# Patient Record
Sex: Male | Born: 1945 | Race: White | Hispanic: No | Marital: Married | Smoking: Current every day smoker
Health system: Southern US, Community
[De-identification: ages and names within clinical notes are randomized; demographics above are authoritative.]

## PROBLEM LIST (undated history)

## (undated) DIAGNOSIS — F419 Anxiety disorder, unspecified: Secondary | ICD-10-CM

## (undated) DIAGNOSIS — J449 Chronic obstructive pulmonary disease, unspecified: Secondary | ICD-10-CM

## (undated) DIAGNOSIS — C61 Malignant neoplasm of prostate: Secondary | ICD-10-CM

## (undated) DIAGNOSIS — K851 Biliary acute pancreatitis without necrosis or infection: Secondary | ICD-10-CM

## (undated) DIAGNOSIS — J189 Pneumonia, unspecified organism: Secondary | ICD-10-CM

## (undated) DIAGNOSIS — E119 Type 2 diabetes mellitus without complications: Secondary | ICD-10-CM

## (undated) DIAGNOSIS — K8020 Calculus of gallbladder without cholecystitis without obstruction: Secondary | ICD-10-CM

## (undated) DIAGNOSIS — F17210 Nicotine dependence, cigarettes, uncomplicated: Secondary | ICD-10-CM

## (undated) HISTORY — PX: PROSTATE SURGERY: SHX751

## (undated) HISTORY — PX: ROTATOR CUFF REPAIR: SHX139

## (undated) HISTORY — PX: LUMBAR FUSION: SHX111

---

## 1998-08-21 ENCOUNTER — Emergency Department (HOSPITAL_COMMUNITY): Admission: EM | Admit: 1998-08-21 | Discharge: 1998-08-21 | Payer: Self-pay

## 1998-11-05 ENCOUNTER — Encounter: Payer: Self-pay | Admitting: Emergency Medicine

## 1998-11-05 ENCOUNTER — Emergency Department (HOSPITAL_COMMUNITY): Admission: EM | Admit: 1998-11-05 | Discharge: 1998-11-05 | Payer: Self-pay | Admitting: Emergency Medicine

## 1998-12-27 ENCOUNTER — Emergency Department (HOSPITAL_COMMUNITY): Admission: EM | Admit: 1998-12-27 | Discharge: 1998-12-28 | Payer: Self-pay | Admitting: Emergency Medicine

## 1999-02-25 ENCOUNTER — Encounter: Payer: Self-pay | Admitting: Emergency Medicine

## 1999-02-25 ENCOUNTER — Emergency Department (HOSPITAL_COMMUNITY): Admission: EM | Admit: 1999-02-25 | Discharge: 1999-02-25 | Payer: Self-pay | Admitting: Emergency Medicine

## 1999-04-11 ENCOUNTER — Encounter: Payer: Self-pay | Admitting: Emergency Medicine

## 1999-04-11 ENCOUNTER — Emergency Department (HOSPITAL_COMMUNITY): Admission: EM | Admit: 1999-04-11 | Discharge: 1999-04-11 | Payer: Self-pay | Admitting: Emergency Medicine

## 1999-06-21 ENCOUNTER — Emergency Department (HOSPITAL_COMMUNITY): Admission: EM | Admit: 1999-06-21 | Discharge: 1999-06-22 | Payer: Self-pay | Admitting: *Deleted

## 1999-07-16 ENCOUNTER — Encounter: Payer: Self-pay | Admitting: Urology

## 1999-07-16 ENCOUNTER — Encounter: Admission: RE | Admit: 1999-07-16 | Discharge: 1999-07-16 | Payer: Self-pay | Admitting: Urology

## 1999-08-22 ENCOUNTER — Emergency Department (HOSPITAL_COMMUNITY): Admission: EM | Admit: 1999-08-22 | Discharge: 1999-08-22 | Payer: Self-pay

## 1999-11-03 ENCOUNTER — Encounter: Payer: Self-pay | Admitting: Emergency Medicine

## 1999-11-03 ENCOUNTER — Emergency Department (HOSPITAL_COMMUNITY): Admission: EM | Admit: 1999-11-03 | Discharge: 1999-11-03 | Payer: Self-pay | Admitting: Emergency Medicine

## 1999-12-19 ENCOUNTER — Emergency Department (HOSPITAL_COMMUNITY): Admission: EM | Admit: 1999-12-19 | Discharge: 1999-12-19 | Payer: Self-pay | Admitting: Emergency Medicine

## 1999-12-19 ENCOUNTER — Encounter: Payer: Self-pay | Admitting: Emergency Medicine

## 2000-04-02 ENCOUNTER — Emergency Department (HOSPITAL_COMMUNITY): Admission: EM | Admit: 2000-04-02 | Discharge: 2000-04-02 | Payer: Self-pay

## 2000-07-07 ENCOUNTER — Emergency Department (HOSPITAL_COMMUNITY): Admission: EM | Admit: 2000-07-07 | Discharge: 2000-07-07 | Payer: Self-pay | Admitting: Emergency Medicine

## 2000-07-07 ENCOUNTER — Encounter: Payer: Self-pay | Admitting: Emergency Medicine

## 2000-10-10 ENCOUNTER — Encounter: Payer: Self-pay | Admitting: Emergency Medicine

## 2000-10-10 ENCOUNTER — Emergency Department (HOSPITAL_COMMUNITY): Admission: EM | Admit: 2000-10-10 | Discharge: 2000-10-10 | Payer: Self-pay | Admitting: Emergency Medicine

## 2001-03-14 ENCOUNTER — Encounter: Payer: Self-pay | Admitting: *Deleted

## 2001-03-14 ENCOUNTER — Encounter (INDEPENDENT_AMBULATORY_CARE_PROVIDER_SITE_OTHER): Payer: Self-pay | Admitting: Specialist

## 2001-03-14 ENCOUNTER — Inpatient Hospital Stay (HOSPITAL_COMMUNITY): Admission: EM | Admit: 2001-03-14 | Discharge: 2001-03-18 | Payer: Self-pay

## 2001-03-14 ENCOUNTER — Encounter: Payer: Self-pay | Admitting: Family Medicine

## 2001-07-10 ENCOUNTER — Ambulatory Visit (HOSPITAL_COMMUNITY): Admission: RE | Admit: 2001-07-10 | Discharge: 2001-07-10 | Payer: Self-pay | Admitting: Internal Medicine

## 2002-09-01 ENCOUNTER — Encounter: Payer: Self-pay | Admitting: Emergency Medicine

## 2002-09-01 ENCOUNTER — Emergency Department (HOSPITAL_COMMUNITY): Admission: EM | Admit: 2002-09-01 | Discharge: 2002-09-01 | Payer: Self-pay | Admitting: Emergency Medicine

## 2002-09-16 ENCOUNTER — Emergency Department (HOSPITAL_COMMUNITY): Admission: EM | Admit: 2002-09-16 | Discharge: 2002-09-16 | Payer: Self-pay | Admitting: Emergency Medicine

## 2002-12-12 ENCOUNTER — Emergency Department (HOSPITAL_COMMUNITY): Admission: EM | Admit: 2002-12-12 | Discharge: 2002-12-12 | Payer: Self-pay | Admitting: Emergency Medicine

## 2003-01-11 ENCOUNTER — Emergency Department (HOSPITAL_COMMUNITY): Admission: EM | Admit: 2003-01-11 | Discharge: 2003-01-11 | Payer: Self-pay | Admitting: Emergency Medicine

## 2003-03-01 ENCOUNTER — Encounter: Admission: RE | Admit: 2003-03-01 | Discharge: 2003-03-01 | Payer: Self-pay | Admitting: Sports Medicine

## 2003-03-04 ENCOUNTER — Encounter: Admission: RE | Admit: 2003-03-04 | Discharge: 2003-03-04 | Payer: Self-pay | Admitting: Family Medicine

## 2003-03-05 ENCOUNTER — Emergency Department (HOSPITAL_COMMUNITY): Admission: EM | Admit: 2003-03-05 | Discharge: 2003-03-05 | Payer: Self-pay

## 2003-03-19 ENCOUNTER — Encounter: Admission: RE | Admit: 2003-03-19 | Discharge: 2003-03-19 | Payer: Self-pay | Admitting: Sports Medicine

## 2003-03-19 ENCOUNTER — Encounter: Admission: RE | Admit: 2003-03-19 | Discharge: 2003-03-19 | Payer: Self-pay | Admitting: Family Medicine

## 2003-03-21 ENCOUNTER — Emergency Department (HOSPITAL_COMMUNITY): Admission: EM | Admit: 2003-03-21 | Discharge: 2003-03-21 | Payer: Self-pay | Admitting: Emergency Medicine

## 2003-03-23 ENCOUNTER — Emergency Department (HOSPITAL_COMMUNITY): Admission: AD | Admit: 2003-03-23 | Discharge: 2003-03-23 | Payer: Self-pay | Admitting: Family Medicine

## 2003-03-25 ENCOUNTER — Encounter: Admission: RE | Admit: 2003-03-25 | Discharge: 2003-03-25 | Payer: Self-pay | Admitting: Sports Medicine

## 2003-04-17 ENCOUNTER — Emergency Department (HOSPITAL_COMMUNITY): Admission: EM | Admit: 2003-04-17 | Discharge: 2003-04-17 | Payer: Self-pay | Admitting: Emergency Medicine

## 2003-04-27 ENCOUNTER — Emergency Department (HOSPITAL_COMMUNITY): Admission: EM | Admit: 2003-04-27 | Discharge: 2003-04-27 | Payer: Self-pay | Admitting: Emergency Medicine

## 2003-06-01 ENCOUNTER — Emergency Department (HOSPITAL_COMMUNITY): Admission: EM | Admit: 2003-06-01 | Discharge: 2003-06-01 | Payer: Self-pay | Admitting: Emergency Medicine

## 2003-06-15 ENCOUNTER — Emergency Department (HOSPITAL_COMMUNITY): Admission: EM | Admit: 2003-06-15 | Discharge: 2003-06-15 | Payer: Self-pay | Admitting: Emergency Medicine

## 2003-07-20 ENCOUNTER — Emergency Department (HOSPITAL_COMMUNITY): Admission: EM | Admit: 2003-07-20 | Discharge: 2003-07-20 | Payer: Self-pay | Admitting: Emergency Medicine

## 2003-10-19 ENCOUNTER — Emergency Department (HOSPITAL_COMMUNITY): Admission: EM | Admit: 2003-10-19 | Discharge: 2003-10-20 | Payer: Self-pay

## 2003-12-15 ENCOUNTER — Emergency Department (HOSPITAL_COMMUNITY): Admission: EM | Admit: 2003-12-15 | Discharge: 2003-12-15 | Payer: Self-pay | Admitting: *Deleted

## 2003-12-27 ENCOUNTER — Emergency Department (HOSPITAL_COMMUNITY): Admission: EM | Admit: 2003-12-27 | Discharge: 2003-12-27 | Payer: Self-pay | Admitting: Emergency Medicine

## 2004-05-02 ENCOUNTER — Emergency Department: Payer: Self-pay | Admitting: Emergency Medicine

## 2004-05-14 ENCOUNTER — Emergency Department: Payer: Self-pay | Admitting: Internal Medicine

## 2004-10-04 ENCOUNTER — Emergency Department (HOSPITAL_COMMUNITY): Admission: EM | Admit: 2004-10-04 | Discharge: 2004-10-04 | Payer: Self-pay | Admitting: Emergency Medicine

## 2004-12-09 IMAGING — CR DG LUMBAR SPINE COMPLETE 4+V
5 series · 5 of 5 positions shown · non-contrast
Comparison: none

CLINICAL DATA: 57-year-old with lower back pain.  Burning in the right hip.  Lifted something heavy.
 LUMBAR SPINE:
 Comparison 07/20/03.
 AP, lateral and oblique views are performed of the lumbar spine, showing the patient to have had prior posterior fusion at L4-5.  There is no evidence for acute fracture, dislocation, spondylolysis or spondylolisthesis.  Mild degenerative changes are identified at L3-4 and L2-3.  
 IMPRESSION
 Postoperative change without evidence for acute abnormality.

[view not recorded (1 of 5)]
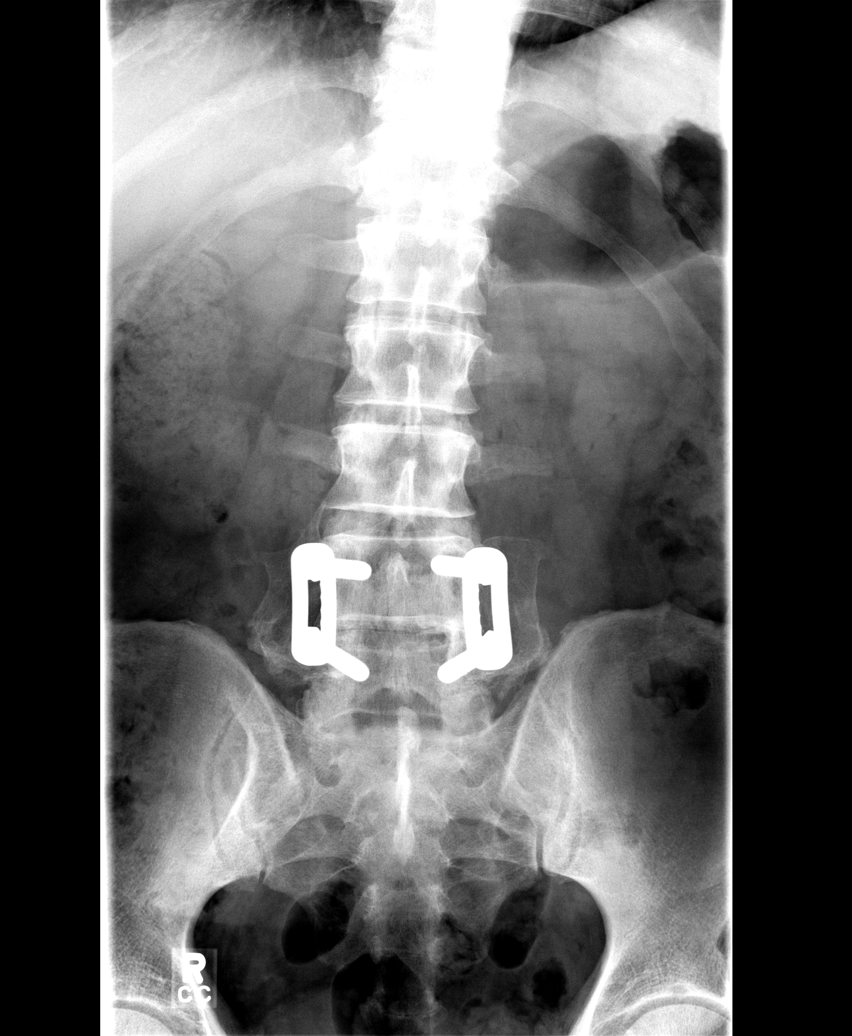

[view not recorded (2 of 5)]
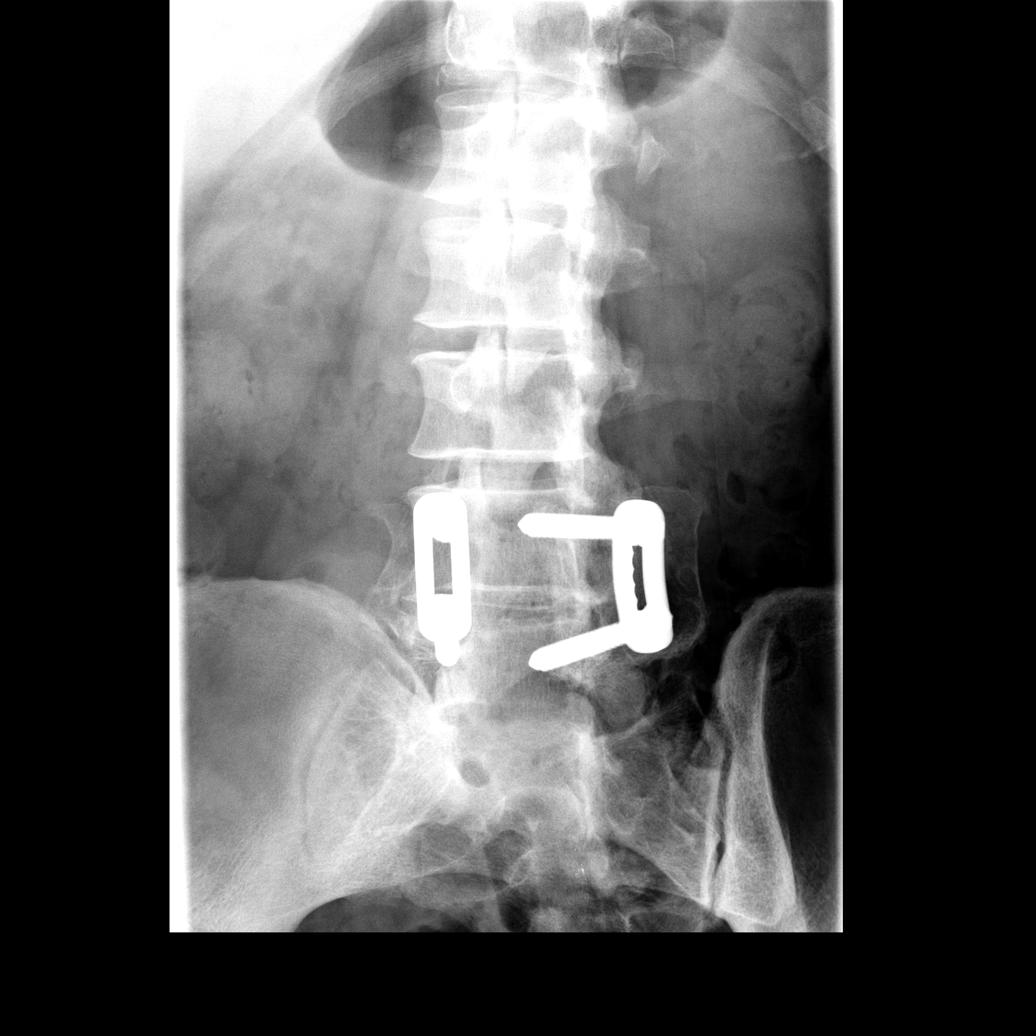

[view not recorded (3 of 5)]
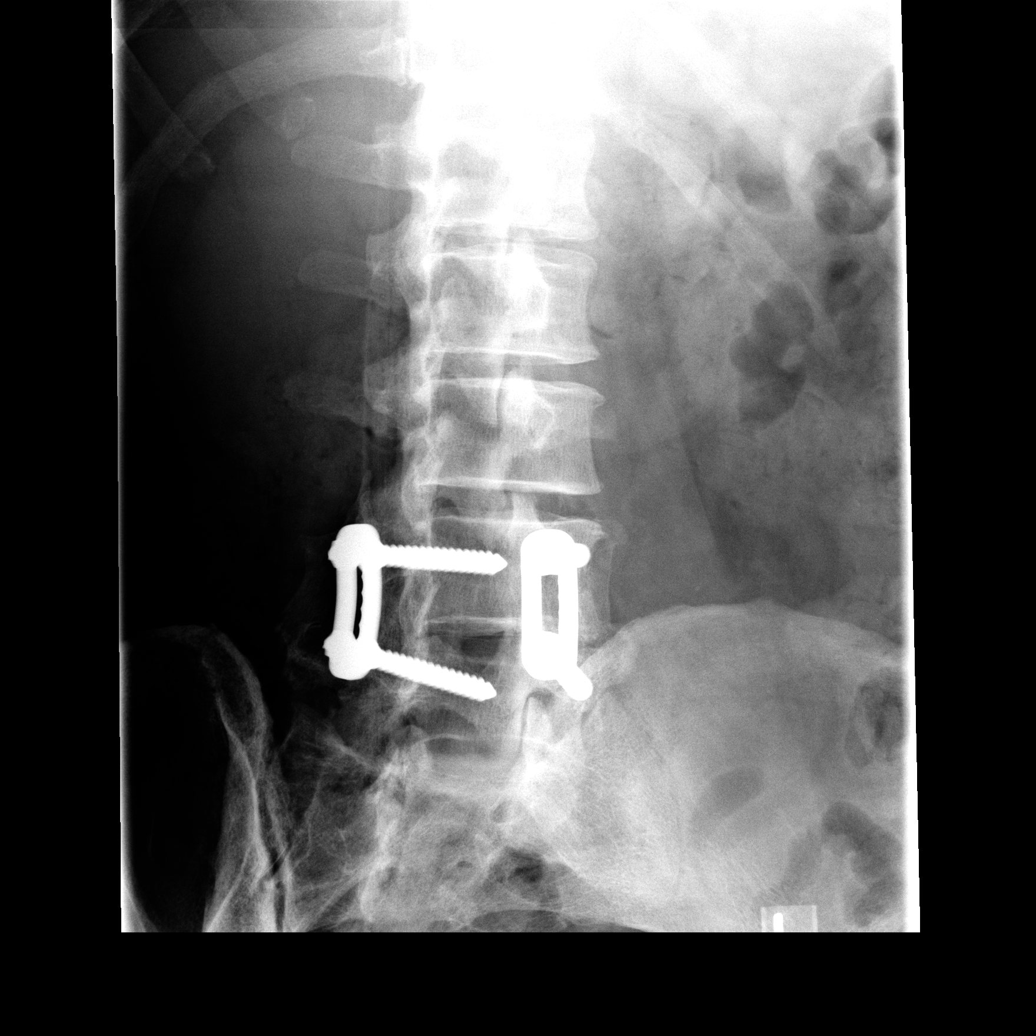

[view not recorded (4 of 5)]
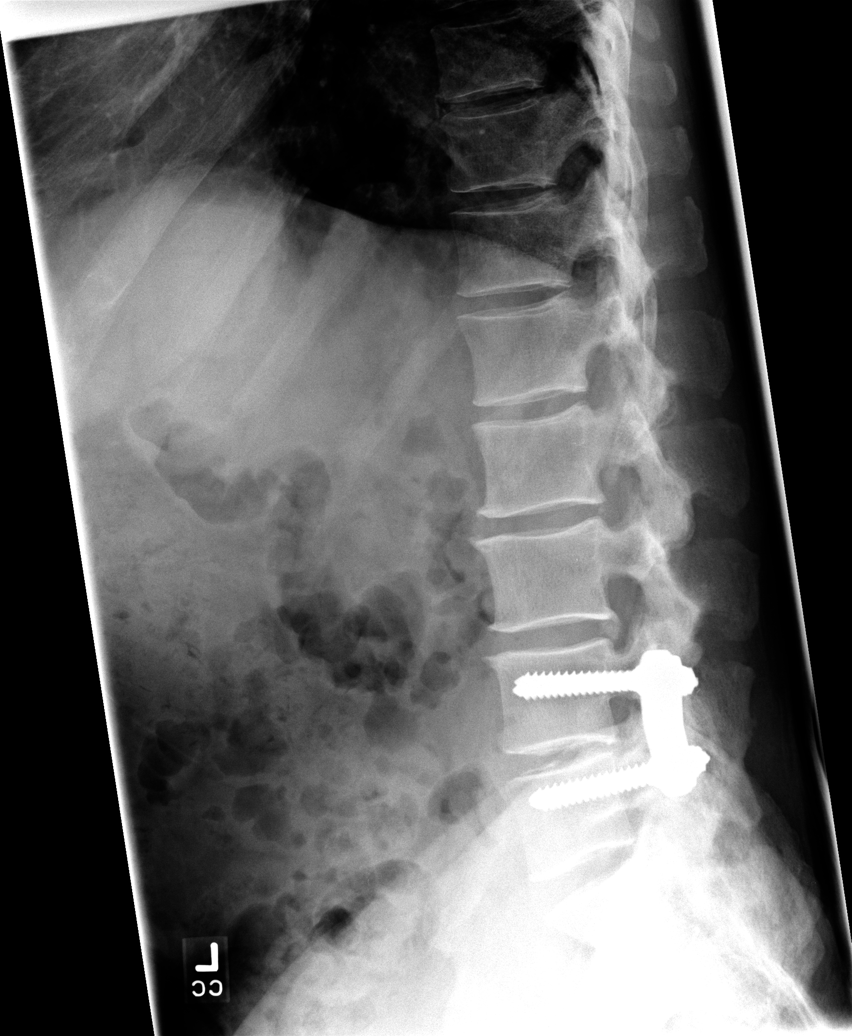

[view not recorded (5 of 5)]
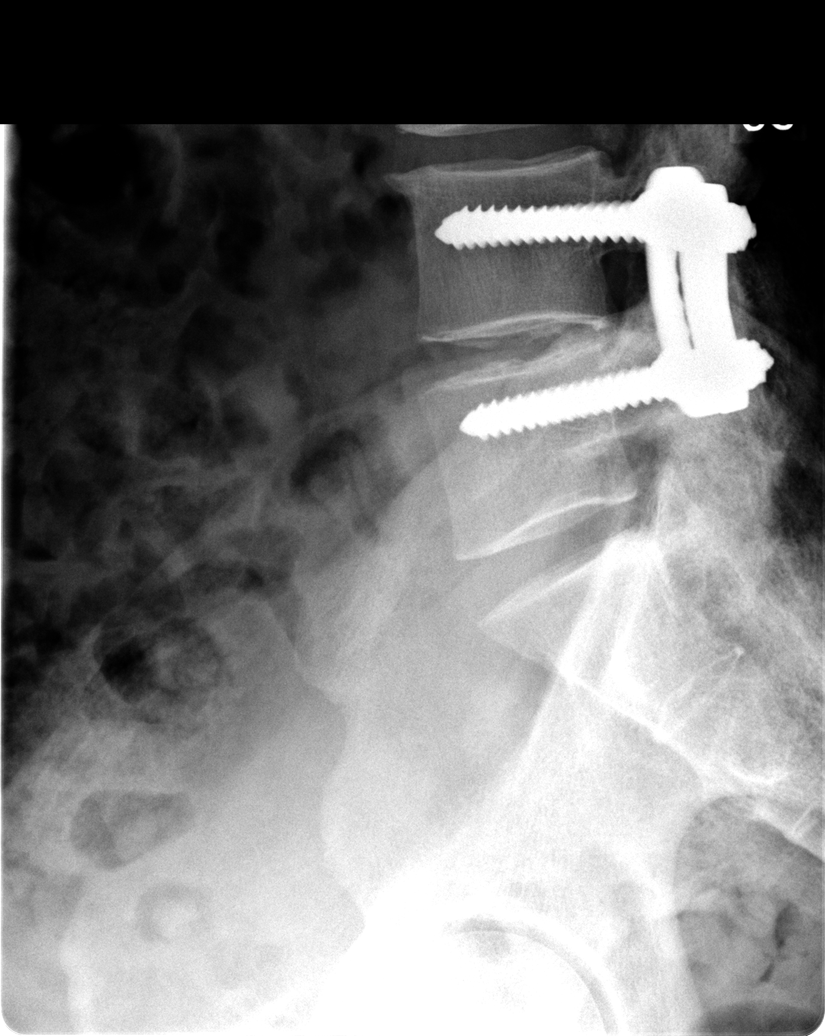

[5 of 5 positions shown; findings below may reference images not displayed]

## 2005-03-04 ENCOUNTER — Emergency Department (HOSPITAL_COMMUNITY): Admission: EM | Admit: 2005-03-04 | Discharge: 2005-03-04 | Payer: Self-pay | Admitting: Emergency Medicine

## 2005-05-02 ENCOUNTER — Emergency Department (HOSPITAL_COMMUNITY): Admission: EM | Admit: 2005-05-02 | Discharge: 2005-05-02 | Payer: Self-pay | Admitting: Emergency Medicine

## 2006-06-09 DIAGNOSIS — N200 Calculus of kidney: Secondary | ICD-10-CM | POA: Insufficient documentation

## 2006-06-09 DIAGNOSIS — F17200 Nicotine dependence, unspecified, uncomplicated: Secondary | ICD-10-CM

## 2006-06-09 DIAGNOSIS — M545 Low back pain: Secondary | ICD-10-CM

## 2009-05-11 ENCOUNTER — Ambulatory Visit: Payer: Self-pay | Admitting: Cardiovascular Disease

## 2009-05-11 ENCOUNTER — Inpatient Hospital Stay (HOSPITAL_COMMUNITY): Admission: EM | Admit: 2009-05-11 | Discharge: 2009-05-19 | Payer: Self-pay | Admitting: Emergency Medicine

## 2009-05-11 ENCOUNTER — Ambulatory Visit: Payer: Self-pay | Admitting: Critical Care Medicine

## 2010-05-02 ENCOUNTER — Encounter: Payer: Self-pay | Admitting: Family Medicine

## 2010-06-28 LAB — URINALYSIS, ROUTINE W REFLEX MICROSCOPIC
Bilirubin Urine: NEGATIVE
Glucose, UA: NEGATIVE mg/dL
Hgb urine dipstick: NEGATIVE
Ketones, ur: NEGATIVE mg/dL
Nitrite: NEGATIVE
Protein, ur: NEGATIVE mg/dL
Specific Gravity, Urine: 1.02 (ref 1.005–1.030)
Urobilinogen, UA: 0.2 mg/dL (ref 0.0–1.0)
pH: 5 (ref 5.0–8.0)

## 2010-06-28 LAB — POCT I-STAT 3, ART BLOOD GAS (G3+)
Acid-base deficit: 6 mmol/L — ABNORMAL HIGH (ref 0.0–2.0)
Acid-base deficit: 6 mmol/L — ABNORMAL HIGH (ref 0.0–2.0)
Acid-base deficit: 9 mmol/L — ABNORMAL HIGH (ref 0.0–2.0)
Bicarbonate: 17.5 mEq/L — ABNORMAL LOW (ref 20.0–24.0)
Bicarbonate: 18.6 mEq/L — ABNORMAL LOW (ref 20.0–24.0)
Bicarbonate: 20.2 mEq/L (ref 20.0–24.0)
Bicarbonate: 22.9 mEq/L (ref 20.0–24.0)
O2 Saturation: 91 %
O2 Saturation: 92 %
O2 Saturation: 97 %
Patient temperature: 36.1
Patient temperature: 36.3
Patient temperature: 36.7
Patient temperature: 98.6
TCO2: 20 mmol/L (ref 0–100)
TCO2: 21 mmol/L (ref 0–100)
TCO2: 25 mmol/L (ref 0–100)
pCO2 arterial: 39.8 mmHg (ref 35.0–45.0)
pCO2 arterial: 45.1 mmHg — ABNORMAL HIGH (ref 35.0–45.0)
pCO2 arterial: 49.5 mmHg — ABNORMAL HIGH (ref 35.0–45.0)
pH, Arterial: 7.159 — CL (ref 7.350–7.450)
pH, Arterial: 7.189 — CL (ref 7.350–7.450)
pH, Arterial: 7.243 — ABNORMAL LOW (ref 7.350–7.450)
pH, Arterial: 7.248 — ABNORMAL LOW (ref 7.350–7.450)
pO2, Arterial: 71 mmHg — ABNORMAL LOW (ref 80.0–100.0)
pO2, Arterial: 84 mmHg (ref 80.0–100.0)

## 2010-06-28 LAB — CBC
HCT: 43 % (ref 39.0–52.0)
HCT: 51.7 % (ref 39.0–52.0)
Hemoglobin: 17.5 g/dL — ABNORMAL HIGH (ref 13.0–17.0)
MCHC: 33.7 g/dL (ref 30.0–36.0)
MCHC: 33.9 g/dL (ref 30.0–36.0)
MCV: 94.3 fL (ref 78.0–100.0)
MCV: 95.5 fL (ref 78.0–100.0)
Platelets: 167 10*3/uL (ref 150–400)
Platelets: 184 10*3/uL (ref 150–400)
RBC: 5.49 MIL/uL (ref 4.22–5.81)
RDW: 14.1 % (ref 11.5–15.5)
RDW: 14.1 % (ref 11.5–15.5)
WBC: 6.5 10*3/uL (ref 4.0–10.5)

## 2010-06-28 LAB — BASIC METABOLIC PANEL
BUN: 19 mg/dL (ref 6–23)
BUN: 19 mg/dL (ref 6–23)
BUN: 21 mg/dL (ref 6–23)
Calcium: 7.4 mg/dL — ABNORMAL LOW (ref 8.4–10.5)
Chloride: 100 mEq/L (ref 96–112)
Creatinine, Ser: 0.9 mg/dL (ref 0.4–1.5)
Creatinine, Ser: 0.99 mg/dL (ref 0.4–1.5)
GFR calc Af Amer: 59 mL/min — ABNORMAL LOW (ref >60)
GFR calc Af Amer: >60 mL/min (ref >60)
GFR calc non Af Amer: >60 mL/min (ref >60)
GFR calc non Af Amer: >60 mL/min (ref >60)
Glucose, Bld: 256 mg/dL — ABNORMAL HIGH (ref 70–99)
Potassium: 4.1 mEq/L (ref 3.5–5.1)
Potassium: 4.7 mEq/L (ref 3.5–5.1)

## 2010-06-28 LAB — COMPREHENSIVE METABOLIC PANEL
ALT: 20 U/L (ref 0–53)
AST: 29 U/L (ref 0–37)
Albumin: 3.6 g/dL (ref 3.5–5.2)
Alkaline Phosphatase: 54 U/L (ref 39–117)
BUN: 19 mg/dL (ref 6–23)
CO2: 24 mEq/L (ref 19–32)
Calcium: 9.5 mg/dL (ref 8.4–10.5)
Chloride: 104 mEq/L (ref 96–112)
Creatinine, Ser: 1.53 mg/dL — ABNORMAL HIGH (ref 0.4–1.5)
GFR calc Af Amer: 56 mL/min — ABNORMAL LOW (ref >60)
GFR calc non Af Amer: 46 mL/min — ABNORMAL LOW (ref >60)
Glucose, Bld: 139 mg/dL — ABNORMAL HIGH (ref 70–99)
Potassium: 4.2 mEq/L (ref 3.5–5.1)
Sodium: 138 mEq/L (ref 135–145)
Total Bilirubin: 1.2 mg/dL (ref 0.3–1.2)
Total Protein: 7.1 g/dL (ref 6.0–8.3)

## 2010-06-28 LAB — RAPID URINE DRUG SCREEN, HOSP PERFORMED
Amphetamines: NOT DETECTED
Barbiturates: NOT DETECTED
Benzodiazepines: POSITIVE — AB
Cocaine: NOT DETECTED
Opiates: POSITIVE — AB
Tetrahydrocannabinol: NOT DETECTED

## 2010-06-28 LAB — GLUCOSE, CAPILLARY
Glucose-Capillary: 126 mg/dL — ABNORMAL HIGH (ref 70–99)
Glucose-Capillary: 148 mg/dL — ABNORMAL HIGH (ref 70–99)
Glucose-Capillary: 168 mg/dL — ABNORMAL HIGH (ref 70–99)
Glucose-Capillary: 196 mg/dL — ABNORMAL HIGH (ref 70–99)
Glucose-Capillary: 217 mg/dL — ABNORMAL HIGH (ref 70–99)

## 2010-06-28 LAB — DIFFERENTIAL
Basophils Absolute: 0 10*3/uL (ref 0.0–0.1)
Basophils Relative: 0 % (ref 0–1)
Eosinophils Absolute: 0 10*3/uL (ref 0.0–0.7)
Eosinophils Relative: 0 % (ref 0–5)
Lymphocytes Relative: 14 % (ref 12–46)
Lymphs Abs: 0.9 10*3/uL (ref 0.7–4.0)
Monocytes Absolute: 0.6 10*3/uL (ref 0.1–1.0)
Monocytes Relative: 9 % (ref 3–12)
Neutro Abs: 5 10*3/uL (ref 1.7–7.7)
Neutrophils Relative %: 77 % (ref 43–77)

## 2010-06-28 LAB — LACTIC ACID, PLASMA: Lactic Acid, Venous: 3.6 mmol/L — ABNORMAL HIGH (ref 0.5–2.2)

## 2010-06-28 LAB — CARBOXYHEMOGLOBIN
Methemoglobin: 1.4 % (ref 0.0–1.5)
O2 Saturation: 72.7 %
Total hemoglobin: 13.5 g/dL (ref 13.5–18.0)

## 2010-06-28 LAB — URINE CULTURE: Colony Count: 2000

## 2010-06-28 LAB — CK TOTAL AND CKMB (NOT AT ARMC)
CK, MB: 1.6 ng/mL (ref 0.3–4.0)
Relative Index: 0.9 (ref 0.0–2.5)
Total CK: 169 U/L (ref 7–232)

## 2010-06-28 LAB — CARDIAC PANEL(CRET KIN+CKTOT+MB+TROPI)
Relative Index: 2.4 (ref 0.0–2.5)
Total CK: 285 U/L — ABNORMAL HIGH (ref 7–232)
Troponin I: 0.03 ng/mL (ref 0.00–0.06)

## 2010-06-28 LAB — CULTURE, BLOOD (ROUTINE X 2): Culture: NO GROWTH

## 2010-06-28 LAB — LEGIONELLA ANTIGEN, URINE

## 2010-06-28 LAB — MAGNESIUM: Magnesium: 1.5 mg/dL (ref 1.5–2.5)

## 2010-06-28 LAB — CULTURE, BAL-QUANTITATIVE W GRAM STAIN

## 2010-06-28 LAB — PROTIME-INR
INR: 1 (ref 0.00–1.49)
Prothrombin Time: 13.1 seconds (ref 11.6–15.2)

## 2010-06-28 LAB — CORTISOL: Cortisol, Plasma: 60.3 ug/dL

## 2010-06-28 LAB — PHOSPHORUS: Phosphorus: 2 mg/dL — ABNORMAL LOW (ref 2.3–4.6)

## 2010-06-28 LAB — TROPONIN I: Troponin I: 0.07 ng/mL — ABNORMAL HIGH (ref 0.00–0.06)

## 2010-06-28 LAB — TYPE AND SCREEN: ABO/RH(D): A POS

## 2010-06-28 LAB — D-DIMER, QUANTITATIVE: D-Dimer, Quant: 3.94 ug/mL-FEU — ABNORMAL HIGH (ref 0.00–0.48)

## 2010-06-28 LAB — MRSA PCR SCREENING

## 2010-07-01 LAB — GLUCOSE, CAPILLARY
Glucose-Capillary: 100 mg/dL — ABNORMAL HIGH (ref 70–99)
Glucose-Capillary: 101 mg/dL — ABNORMAL HIGH (ref 70–99)
Glucose-Capillary: 103 mg/dL — ABNORMAL HIGH (ref 70–99)
Glucose-Capillary: 105 mg/dL — ABNORMAL HIGH (ref 70–99)
Glucose-Capillary: 107 mg/dL — ABNORMAL HIGH (ref 70–99)
Glucose-Capillary: 110 mg/dL — ABNORMAL HIGH (ref 70–99)
Glucose-Capillary: 114 mg/dL — ABNORMAL HIGH (ref 70–99)
Glucose-Capillary: 116 mg/dL — ABNORMAL HIGH (ref 70–99)
Glucose-Capillary: 119 mg/dL — ABNORMAL HIGH (ref 70–99)
Glucose-Capillary: 119 mg/dL — ABNORMAL HIGH (ref 70–99)
Glucose-Capillary: 122 mg/dL — ABNORMAL HIGH (ref 70–99)
Glucose-Capillary: 125 mg/dL — ABNORMAL HIGH (ref 70–99)
Glucose-Capillary: 127 mg/dL — ABNORMAL HIGH (ref 70–99)
Glucose-Capillary: 129 mg/dL — ABNORMAL HIGH (ref 70–99)
Glucose-Capillary: 131 mg/dL — ABNORMAL HIGH (ref 70–99)
Glucose-Capillary: 79 mg/dL (ref 70–99)
Glucose-Capillary: 83 mg/dL (ref 70–99)
Glucose-Capillary: 96 mg/dL (ref 70–99)
Glucose-Capillary: 96 mg/dL (ref 70–99)

## 2010-07-01 LAB — POCT I-STAT 3, ART BLOOD GAS (G3+)
Acid-base deficit: 1 mmol/L (ref 0.0–2.0)
Bicarbonate: 23.9 mEq/L (ref 20.0–24.0)
O2 Saturation: 88 %
O2 Saturation: 96 %
Patient temperature: 37.4
TCO2: 25 mmol/L (ref 0–100)
TCO2: 26 mmol/L (ref 0–100)
TCO2: 28 mmol/L (ref 0–100)
pCO2 arterial: 36.8 mmHg (ref 35.0–45.0)
pCO2 arterial: 39.3 mmHg (ref 35.0–45.0)
pO2, Arterial: 79 mmHg — ABNORMAL LOW (ref 80.0–100.0)

## 2010-07-01 LAB — CBC
HCT: 37 % — ABNORMAL LOW (ref 39.0–52.0)
HCT: 39.2 % (ref 39.0–52.0)
Hemoglobin: 10.9 g/dL — ABNORMAL LOW (ref 13.0–17.0)
Hemoglobin: 11.3 g/dL — ABNORMAL LOW (ref 13.0–17.0)
Hemoglobin: 12.7 g/dL — ABNORMAL LOW (ref 13.0–17.0)
MCHC: 33.2 g/dL (ref 30.0–36.0)
MCHC: 34 g/dL (ref 30.0–36.0)
MCHC: 34.2 g/dL (ref 30.0–36.0)
MCV: 92.8 fL (ref 78.0–100.0)
MCV: 94.3 fL (ref 78.0–100.0)
MCV: 94.8 fL (ref 78.0–100.0)
MCV: 96.3 fL (ref 78.0–100.0)
Platelets: 124 10*3/uL — ABNORMAL LOW (ref 150–400)
Platelets: 156 10*3/uL (ref 150–400)
Platelets: 235 10*3/uL (ref 150–400)
RBC: 3.3 MIL/uL — ABNORMAL LOW (ref 4.22–5.81)
RBC: 3.38 MIL/uL — ABNORMAL LOW (ref 4.22–5.81)
RBC: 3.57 MIL/uL — ABNORMAL LOW (ref 4.22–5.81)
RBC: 3.71 MIL/uL — ABNORMAL LOW (ref 4.22–5.81)
RBC: 3.96 MIL/uL — ABNORMAL LOW (ref 4.22–5.81)
RDW: 14.3 % (ref 11.5–15.5)
RDW: 14.6 % (ref 11.5–15.5)
WBC: 11.6 10*3/uL — ABNORMAL HIGH (ref 4.0–10.5)
WBC: 7.4 10*3/uL (ref 4.0–10.5)
WBC: 7.6 10*3/uL (ref 4.0–10.5)
WBC: 7.9 10*3/uL (ref 4.0–10.5)
WBC: 8.7 10*3/uL (ref 4.0–10.5)

## 2010-07-01 LAB — BASIC METABOLIC PANEL
BUN: 16 mg/dL (ref 6–23)
BUN: 19 mg/dL (ref 6–23)
BUN: 21 mg/dL (ref 6–23)
CO2: 24 mEq/L (ref 19–32)
CO2: 27 mEq/L (ref 19–32)
CO2: 27 mEq/L (ref 19–32)
CO2: 28 mEq/L (ref 19–32)
Calcium: 7.7 mg/dL — ABNORMAL LOW (ref 8.4–10.5)
Calcium: 8.6 mg/dL (ref 8.4–10.5)
Chloride: 105 mEq/L (ref 96–112)
Chloride: 106 mEq/L (ref 96–112)
Chloride: 108 mEq/L (ref 96–112)
Chloride: 113 mEq/L — ABNORMAL HIGH (ref 96–112)
Chloride: 119 mEq/L — ABNORMAL HIGH (ref 96–112)
Creatinine, Ser: 0.71 mg/dL (ref 0.4–1.5)
Creatinine, Ser: 0.72 mg/dL (ref 0.4–1.5)
Creatinine, Ser: 0.82 mg/dL (ref 0.4–1.5)
Creatinine, Ser: 0.94 mg/dL (ref 0.4–1.5)
GFR calc Af Amer: >60 mL/min (ref >60)
GFR calc Af Amer: >60 mL/min (ref >60)
GFR calc Af Amer: >60 mL/min (ref >60)
GFR calc Af Amer: >60 mL/min (ref >60)
GFR calc Af Amer: >60 mL/min (ref >60)
GFR calc Af Amer: >60 mL/min (ref >60)
GFR calc non Af Amer: >60 mL/min (ref >60)
GFR calc non Af Amer: >60 mL/min (ref >60)
GFR calc non Af Amer: >60 mL/min (ref >60)
GFR calc non Af Amer: >60 mL/min (ref >60)
Glucose, Bld: 80 mg/dL (ref 70–99)
Potassium: 4.1 mEq/L (ref 3.5–5.1)
Potassium: 4.1 mEq/L (ref 3.5–5.1)
Sodium: 130 mEq/L — ABNORMAL LOW (ref 135–145)
Sodium: 138 mEq/L (ref 135–145)
Sodium: 143 mEq/L (ref 135–145)

## 2010-07-01 LAB — MAGNESIUM
Magnesium: 1.5 mg/dL (ref 1.5–2.5)
Magnesium: 2 mg/dL (ref 1.5–2.5)
Magnesium: 2.1 mg/dL (ref 1.5–2.5)

## 2010-07-01 LAB — PROTIME-INR
INR: 1.26 (ref 0.00–1.49)
INR: 1.35 (ref 0.00–1.49)
Prothrombin Time: 15.7 seconds — ABNORMAL HIGH (ref 11.6–15.2)

## 2010-07-01 LAB — PHOSPHORUS
Phosphorus: 2.5 mg/dL (ref 2.3–4.6)
Phosphorus: 3.9 mg/dL (ref 2.3–4.6)

## 2010-07-01 LAB — HEMOGLOBIN A1C: Mean Plasma Glucose: 143 mg/dL

## 2010-08-28 NOTE — Procedures (Signed)
[place]. [facility]  Patient:    Bruce Franklin, Bruce Franklin Visit Number: [id number] MRN: [id number]          Service Type: MED Location: [phone] Attending Physician:  McDiarmid, Leighton Roach. Dictated by:   Everardo All Madilyn Fireman, M.D. Proc. Date: 03/17/01 Admit Date:  03/14/2001   CC:         [name] McDiarmid, M.D.   Procedure Report  PROCEDURE:  Colonoscopy.  INDICATION FOR PROCEDURE:  Heme-positive stools and abdominal pain and a history of colon polyps, overdue for surveillance colonoscopy.  DESCRIPTION OF PROCEDURE:  The patient was placed in the left lateral decubitus position and placed on the pulse monitor with continuous low-flow oxygen delivered by nasal cannula.  He was sedated with 100 mg IV Demerol and 10 mg IV Versed.  The Olympus video colonoscope was inserted into the rectum and advanced to the cecum, confirmed by transillumination of McBurneys point and visualization of the ileocecal valve.  The prep was somewhat limited, and I could not clear off enough stool to see the appendiceal orifice, but I was confident the cecum was reached.  Due to the poor prep, I could not rule out small lesions less than 1 cm in all areas.  Otherwise, the cecum, ascending, transverse, descending, and sigmoid colon all appeared normal with no masses, polyps, diverticula, or other mucosal abnormalities.  Again, because of prep I could not definitely rule out a few diverticula, although I did not see any. Within the rectum there were some areas of patchy exudate that on initial entry appeared possibly consistent with pseudomembranes, but on withdrawal these were not seen and were thought to probably represent adherent bits of stool.  Careful inspection of the rectum on withdrawal revealed a fine granularity with some erythema, the significance of which was unclear given the somewhat limited prep.  I did take biopsies of the rectal mucosa.  There were no significant  enlargement of internal hemorrhoids and no stigma of bleeding in the perianal area.  The colonoscope was then withdrawn and the patient returned to the recovery room in stable condition.  He tolerated the procedure well, and there were no immediate complications.  IMPRESSION:  Possible minimal proctitis, otherwise normal colonoscopy with somewhat limited prep.  PLAN:  Continue to treat for prostatitis/epididymitis and if abdominal pain continues, consider repeating Hemoccults later and if upper abdominal pain present, consider upper endoscopy. Dictated by:   Everardo All Madilyn Fireman, M.D. Attending Physician:  McDiarmid, Tawanna Cooler D. DD:  03/17/01 TD:  03/18/01 Job: [id number]

## 2010-08-28 NOTE — Discharge Summary (Signed)
[place]. [facility]  Patient:    ARSLAN, KIER Visit Number: [id number] MRN: [id number]          Service Type: MED Location: [phone] Attending Physician:  McDiarmid, Leighton Roach. Dictated by:   [name] Admit Date:  03/14/2001 Discharge Date: 03/18/2001                             Discharge Summary  DATE OF BIRTH:  12-17-1945  ADMISSION DIAGNOSES: 1. Abdominal pain. 2. Dysuria. 3. Bright red blood per rectum. 4. History of kidney stones. 5. History of chronic low back pain status post L4-5 fusion. 6. History of colonic polyps status post polypectomy in 1984.  DISCHARGE DIAGNOSES: 1. Prostatitis. 2. History of kidney stones. 3. History of chronic low back pain status post L4-5 fusion. 4. History of colonic polyps status post polypectomy 1984.  PROCEDURES: 1. Colonoscopy. 2. Plain film L spine. 3. CT of the abdomen.  CONSULTS:  GI.  HISTORY AND PHYSICAL:  Mr. Junkins is a 65 year old patient from [facility] who presented with a one month history of progressively worse abdominal pain associated with bright red blood per rectum and dysuria on day of admission.  Noted to have four weeks of pain with defecation and hard stools. Noted intermittent progressively worsening left lower quadrant abdominal pain radiating to the low back, ______, and left testicle.  The night for admission a particularly painful bowel movement with significant straining. In the morning noted to have a large amount of bright red blood and clots in toilet.  HOSPITAL COURSE:  #1 - ABDOMINAL PAIN:  Patients initial urine showed many bacteria, 3-6 wbcs. His prostate was tender to examination.  His stool was heme-positive.  His right lower quadrant of his abdomen was tender to palpation.  Urine culture showed greater than 100,000 E. coli.  Abdominal CT was normal.  Lumbar spine film showed old L4-L5 fusion.  Liver function was within normal  limits. Amylase and lipase were normal.  GC and chlamydia were normal.  GI was consulted due to bright red blood per rectum and history of polyps. Colonoscopy was suboptimal, but showed no obvious abnormality, shown to be questionable mild distal proctitis.  Dr. Madilyn Fireman thought there was no obvious GI explanation for his pain and would suggest to continue treatment for epididymitis/prostatitis.  If the pain did not improve, a urology referral was considered or EGD if Hemoccult testing was positive in the future.  Patient was put on morphine pump.  The day of admission he insisted on discharge due to social reasons, wanting to be home with grandchild and wanting to sleep better.  He was aware of the difficulties with pain control given no trial of oral medications having been done.  He was willing to leave despite this.  He had continued slight dysuria.  Had no further bright red blood per rectum. Was tolerating p.o.s well.  #2 - DYSURIA:  Patient was started on Cipro for both his UTI and likely prostatitis.  He was having improvement in symptoms prior to discharge.  #3 - BRIGHT RED BLOOD PER RECTUM:  Patient was examined by GI as described above.  No findings were seen on colonoscopy.  DISCHARGE MEDICATIONS: 1. Ciprofloxacin 500 mg p.o. b.i.d. to complete a 14 day course. 2. Percocet 7.5/500 one to two tablets q.6h. not to exceed eight tablets in a    day.  Quantity sufficient  to cover patient until follow-up Tuesday,    December 10.  Number given:  25.  FOLLOWUP:  Tuesday, December 10 at 2 p.m. in [facility] with ______.  CONDITION ON DISCHARGE:  Stable. Dictated by:   [name] Attending Physician:  McDiarmid, Leighton Roach DD:  03/18/01 TD:  03/18/01 Job: [id number]

## 2012-01-19 ENCOUNTER — Other Ambulatory Visit: Payer: Self-pay | Admitting: Family Medicine

## 2012-01-19 DIAGNOSIS — R319 Hematuria, unspecified: Secondary | ICD-10-CM

## 2012-01-20 ENCOUNTER — Other Ambulatory Visit: Payer: Self-pay

## 2014-11-14 ENCOUNTER — Emergency Department (HOSPITAL_COMMUNITY): Payer: Medicare Other

## 2014-11-14 ENCOUNTER — Encounter (HOSPITAL_COMMUNITY): Payer: Self-pay | Admitting: Emergency Medicine

## 2014-11-14 ENCOUNTER — Inpatient Hospital Stay (HOSPITAL_COMMUNITY)
Admission: EM | Admit: 2014-11-14 | Discharge: 2014-11-17 | DRG: 872 | Disposition: A | Discharge status: Home / Self Care | Payer: Medicare Other | Attending: Family Medicine | Admitting: Family Medicine

## 2014-11-14 DIAGNOSIS — L723 Sebaceous cyst: Secondary | ICD-10-CM | POA: Diagnosis present

## 2014-11-14 DIAGNOSIS — J449 Chronic obstructive pulmonary disease, unspecified: Secondary | ICD-10-CM | POA: Diagnosis not present

## 2014-11-14 DIAGNOSIS — A419 Sepsis, unspecified organism: Secondary | ICD-10-CM | POA: Diagnosis not present

## 2014-11-14 DIAGNOSIS — E46 Unspecified protein-calorie malnutrition: Secondary | ICD-10-CM | POA: Diagnosis not present

## 2014-11-14 DIAGNOSIS — F17210 Nicotine dependence, cigarettes, uncomplicated: Secondary | ICD-10-CM | POA: Diagnosis present

## 2014-11-14 DIAGNOSIS — Z6821 Body mass index (BMI) 21.0-21.9, adult: Secondary | ICD-10-CM | POA: Diagnosis not present

## 2014-11-14 DIAGNOSIS — E119 Type 2 diabetes mellitus without complications: Secondary | ICD-10-CM | POA: Insufficient documentation

## 2014-11-14 DIAGNOSIS — L02212 Cutaneous abscess of back [any part, except buttock]: Secondary | ICD-10-CM | POA: Insufficient documentation

## 2014-11-14 DIAGNOSIS — Z9119 Patient's noncompliance with other medical treatment and regimen: Secondary | ICD-10-CM | POA: Diagnosis present

## 2014-11-14 DIAGNOSIS — R739 Hyperglycemia, unspecified: Secondary | ICD-10-CM | POA: Insufficient documentation

## 2014-11-14 DIAGNOSIS — I10 Essential (primary) hypertension: Secondary | ICD-10-CM | POA: Diagnosis present

## 2014-11-14 DIAGNOSIS — Z8546 Personal history of malignant neoplasm of prostate: Secondary | ICD-10-CM

## 2014-11-14 DIAGNOSIS — G8929 Other chronic pain: Secondary | ICD-10-CM | POA: Diagnosis present

## 2014-11-14 DIAGNOSIS — E1165 Type 2 diabetes mellitus with hyperglycemia: Secondary | ICD-10-CM | POA: Diagnosis not present

## 2014-11-14 DIAGNOSIS — F419 Anxiety disorder, unspecified: Secondary | ICD-10-CM | POA: Diagnosis present

## 2014-11-14 DIAGNOSIS — E440 Moderate protein-calorie malnutrition: Secondary | ICD-10-CM | POA: Insufficient documentation

## 2014-11-14 HISTORY — DX: Anxiety disorder, unspecified: F41.9

## 2014-11-14 HISTORY — DX: Type 2 diabetes mellitus without complications: E11.9

## 2014-11-14 HISTORY — DX: Nicotine dependence, cigarettes, uncomplicated: F17.210

## 2014-11-14 HISTORY — DX: Malignant neoplasm of prostate: C61

## 2014-11-14 HISTORY — DX: Chronic obstructive pulmonary disease, unspecified: J44.9

## 2014-11-14 HISTORY — DX: Pneumonia, unspecified organism: J18.9

## 2014-11-14 LAB — COMPREHENSIVE METABOLIC PANEL
ALT: 18 U/L (ref 17–63)
AST: 20 U/L (ref 15–41)
Albumin: 3 g/dL — ABNORMAL LOW (ref 3.5–5.0)
Alkaline Phosphatase: 83 U/L (ref 38–126)
Anion gap: 13 (ref 5–15)
BUN: 18 mg/dL (ref 6–20)
CALCIUM: 8.4 mg/dL — AB (ref 8.9–10.3)
CO2: 18 mmol/L — AB (ref 22–32)
CREATININE: 1.22 mg/dL (ref 0.61–1.24)
Chloride: 102 mmol/L (ref 101–111)
GFR calc Af Amer: >60 mL/min (ref >60)
GFR, EST NON AFRICAN AMERICAN: 59 mL/min — AB (ref >60)
Glucose, Bld: 372 mg/dL — ABNORMAL HIGH (ref 65–99)
Potassium: 4.3 mmol/L (ref 3.5–5.1)
Sodium: 133 mmol/L — ABNORMAL LOW (ref 135–145)
Total Bilirubin: 1.3 mg/dL — ABNORMAL HIGH (ref 0.3–1.2)
Total Protein: 6.2 g/dL — ABNORMAL LOW (ref 6.5–8.1)

## 2014-11-14 LAB — CBC WITH DIFFERENTIAL/PLATELET
BASOS ABS: 0 10*3/uL (ref 0.0–0.1)
Basophils Relative: 0 % (ref 0–1)
EOS PCT: 0 % (ref 0–5)
Eosinophils Absolute: 0 10*3/uL (ref 0.0–0.7)
HCT: 43.2 % (ref 39.0–52.0)
HEMOGLOBIN: 14.9 g/dL (ref 13.0–17.0)
LYMPHS ABS: 0.6 10*3/uL — AB (ref 0.7–4.0)
Lymphocytes Relative: 4 % — ABNORMAL LOW (ref 12–46)
MCH: 30.8 pg (ref 26.0–34.0)
MCHC: 34.5 g/dL (ref 30.0–36.0)
MCV: 89.3 fL (ref 78.0–100.0)
MONOS PCT: 10 % (ref 3–12)
Monocytes Absolute: 1.6 10*3/uL — ABNORMAL HIGH (ref 0.1–1.0)
Neutro Abs: 13.4 10*3/uL — ABNORMAL HIGH (ref 1.7–7.7)
Neutrophils Relative %: 86 % — ABNORMAL HIGH (ref 43–77)
Platelets: 206 10*3/uL (ref 150–400)
RBC: 4.84 MIL/uL (ref 4.22–5.81)
RDW: 13 % (ref 11.5–15.5)
WBC: 15.6 10*3/uL — ABNORMAL HIGH (ref 4.0–10.5)

## 2014-11-14 LAB — I-STAT CG4 LACTIC ACID, ED
Lactic Acid, Venous: 1.7 mmol/L (ref 0.5–2.0)
Lactic Acid, Venous: 1.2 mmol/L (ref 0.5–2.0)

## 2014-11-14 LAB — RAPID URINE DRUG SCREEN, HOSP PERFORMED
Amphetamines: NOT DETECTED
Barbiturates: NOT DETECTED
Benzodiazepines: POSITIVE — AB
Cocaine: NOT DETECTED
Opiates: POSITIVE — AB
Tetrahydrocannabinol: NOT DETECTED

## 2014-11-14 LAB — URINALYSIS, ROUTINE W REFLEX MICROSCOPIC
BILIRUBIN URINE: NEGATIVE
KETONES UR: 40 mg/dL — AB
Leukocytes, UA: NEGATIVE
NITRITE: NEGATIVE
Protein, ur: 30 mg/dL — AB
SPECIFIC GRAVITY, URINE: 1.037 — AB (ref 1.005–1.030)
Urobilinogen, UA: 0.2 mg/dL (ref 0.0–1.0)
pH: 5 (ref 5.0–8.0)

## 2014-11-14 LAB — I-STAT VENOUS BLOOD GAS, ED
ACID-BASE DEFICIT: 8 mmol/L — AB (ref 0.0–2.0)
Bicarbonate: 17.2 mEq/L — ABNORMAL LOW (ref 20.0–24.0)
O2 Saturation: 73 %
PCO2 VEN: 34.6 mmHg — AB (ref 45.0–50.0)
TCO2: 18 mmol/L (ref 0–100)
pH, Ven: 7.305 — ABNORMAL HIGH (ref 7.250–7.300)
pO2, Ven: 42 mmHg (ref 30.0–45.0)

## 2014-11-14 LAB — I-STAT CREATININE, ED: Creatinine, Ser: 1 mg/dL (ref 0.61–1.24)

## 2014-11-14 LAB — CBG MONITORING, ED
GLUCOSE-CAPILLARY: 317 mg/dL — AB (ref 65–99)
GLUCOSE-CAPILLARY: 337 mg/dL — AB (ref 65–99)
Glucose-Capillary: 413 mg/dL — ABNORMAL HIGH (ref 65–99)

## 2014-11-14 LAB — URINE MICROSCOPIC-ADD ON

## 2014-11-14 LAB — GLUCOSE, CAPILLARY: GLUCOSE-CAPILLARY: 246 mg/dL — AB (ref 65–99)

## 2014-11-14 MED ORDER — SODIUM CHLORIDE 0.9 % IV BOLUS (SEPSIS)
500.0000 mL | INTRAVENOUS | Status: AC
  Administered 2014-11-14: 500 mL via INTRAVENOUS

## 2014-11-14 MED ORDER — VANCOMYCIN HCL IN DEXTROSE 1-5 GM/200ML-% IV SOLN
1000.0000 mg | Freq: Two times a day (BID) | INTRAVENOUS | Status: DC
  Administered 2014-11-15 – 2014-11-16 (×3): 1000 mg via INTRAVENOUS
  Filled 2014-11-14 (×3): qty 200

## 2014-11-14 MED ORDER — VITAMIN B-1 100 MG PO TABS
100.0000 mg | ORAL_TABLET | Freq: Every day | ORAL | Status: DC
  Administered 2014-11-15 – 2014-11-17 (×4): 100 mg via ORAL
  Filled 2014-11-14 (×4): qty 1

## 2014-11-14 MED ORDER — INSULIN ASPART 100 UNIT/ML ~~LOC~~ SOLN
0.0000 [IU] | Freq: Every day | SUBCUTANEOUS | Status: DC
  Administered 2014-11-14: 2 [IU] via SUBCUTANEOUS

## 2014-11-14 MED ORDER — NICOTINE 21 MG/24HR TD PT24
21.0000 mg | MEDICATED_PATCH | Freq: Every day | TRANSDERMAL | Status: DC
  Administered 2014-11-14 – 2014-11-17 (×4): 21 mg via TRANSDERMAL
  Filled 2014-11-14 (×4): qty 1

## 2014-11-14 MED ORDER — THIAMINE HCL 100 MG/ML IJ SOLN
100.0000 mg | Freq: Every day | INTRAMUSCULAR | Status: DC
  Filled 2014-11-14: qty 2
  Filled 2014-11-14: qty 1

## 2014-11-14 MED ORDER — MORPHINE SULFATE 4 MG/ML IJ SOLN: 4.0000 mg | Freq: Once | INTRAMUSCULAR | Status: DC

## 2014-11-14 MED ORDER — INSULIN ASPART 100 UNIT/ML ~~LOC~~ SOLN
0.0000 [IU] | Freq: Three times a day (TID) | SUBCUTANEOUS | Status: DC
  Administered 2014-11-15: 11 [IU] via SUBCUTANEOUS
  Administered 2014-11-15 (×2): 8 [IU] via SUBCUTANEOUS
  Administered 2014-11-16: 2 [IU] via SUBCUTANEOUS
  Administered 2014-11-16 (×2): 8 [IU] via SUBCUTANEOUS
  Administered 2014-11-17: 11 [IU] via SUBCUTANEOUS
  Administered 2014-11-17: 5 [IU] via SUBCUTANEOUS

## 2014-11-14 MED ORDER — IOHEXOL 300 MG/ML  SOLN
75.0000 mL | Freq: Once | INTRAMUSCULAR | Status: AC | PRN
  Administered 2014-11-14: 100 mL via INTRAVENOUS

## 2014-11-14 MED ORDER — VANCOMYCIN HCL IN DEXTROSE 1-5 GM/200ML-% IV SOLN
1000.0000 mg | Freq: Once | INTRAVENOUS | Status: DC
  Filled 2014-11-14: qty 200

## 2014-11-14 MED ORDER — ENOXAPARIN SODIUM 40 MG/0.4ML ~~LOC~~ SOLN
40.0000 mg | SUBCUTANEOUS | Status: DC
  Administered 2014-11-14 – 2014-11-16 (×3): 40 mg via SUBCUTANEOUS
  Filled 2014-11-14 (×4): qty 0.4

## 2014-11-14 MED ORDER — ADULT MULTIVITAMIN W/MINERALS CH
1.0000 | ORAL_TABLET | Freq: Every day | ORAL | Status: DC
  Administered 2014-11-15 – 2014-11-17 (×4): 1 via ORAL
  Filled 2014-11-14 (×5): qty 1

## 2014-11-14 MED ORDER — ENSURE ENLIVE PO LIQD
237.0000 mL | Freq: Two times a day (BID) | ORAL | Status: DC
  Administered 2014-11-15 – 2014-11-17 (×6): 237 mL via ORAL

## 2014-11-14 MED ORDER — LORAZEPAM 2 MG/ML IJ SOLN: 1.0000 mg | Freq: Four times a day (QID) | INTRAMUSCULAR | Status: DC | PRN

## 2014-11-14 MED ORDER — ONDANSETRON HCL 4 MG PO TABS: 4.0000 mg | ORAL_TABLET | Freq: Four times a day (QID) | ORAL | Status: DC | PRN

## 2014-11-14 MED ORDER — MORPHINE SULFATE 2 MG/ML IJ SOLN
1.0000 mg | INTRAMUSCULAR | Status: DC | PRN
  Administered 2014-11-14 – 2014-11-15 (×5): 1 mg via INTRAVENOUS
  Filled 2014-11-14 (×5): qty 1

## 2014-11-14 MED ORDER — LIDOCAINE-EPINEPHRINE (PF) 2 %-1:200000 IJ SOLN
10.0000 mL | Freq: Once | INTRAMUSCULAR | Status: AC
  Administered 2014-11-14: 10 mL via INTRADERMAL
  Filled 2014-11-14: qty 20

## 2014-11-14 MED ORDER — LORAZEPAM 1 MG PO TABS: 1.0000 mg | ORAL_TABLET | Freq: Four times a day (QID) | ORAL | Status: DC | PRN

## 2014-11-14 MED ORDER — ONDANSETRON HCL 4 MG/2ML IJ SOLN: 4.0000 mg | Freq: Four times a day (QID) | INTRAMUSCULAR | Status: DC | PRN

## 2014-11-14 MED ORDER — BUDESONIDE-FORMOTEROL FUMARATE 80-4.5 MCG/ACT IN AERO
2.0000 | INHALATION_SPRAY | Freq: Two times a day (BID) | RESPIRATORY_TRACT | Status: DC
  Administered 2014-11-14 – 2014-11-17 (×5): 2 via RESPIRATORY_TRACT
  Filled 2014-11-14 (×2): qty 6.9

## 2014-11-14 MED ORDER — VANCOMYCIN HCL 10 G IV SOLR
1500.0000 mg | Freq: Once | INTRAVENOUS | Status: AC
  Administered 2014-11-14: 1500 mg via INTRAVENOUS
  Filled 2014-11-14: qty 1500

## 2014-11-14 MED ORDER — SODIUM CHLORIDE 0.9 % IV BOLUS (SEPSIS)
1000.0000 mL | INTRAVENOUS | Status: AC
  Administered 2014-11-14 (×2): 1000 mL via INTRAVENOUS

## 2014-11-14 MED ORDER — INSULIN ASPART 100 UNIT/ML ~~LOC~~ SOLN
5.0000 [IU] | Freq: Once | SUBCUTANEOUS | Status: AC
  Administered 2014-11-14: 5 [IU] via SUBCUTANEOUS
  Filled 2014-11-14: qty 1

## 2014-11-14 MED ORDER — PIPERACILLIN-TAZOBACTAM 3.375 G IVPB
3.3750 g | Freq: Three times a day (TID) | INTRAVENOUS | Status: DC
  Administered 2014-11-14 – 2014-11-16 (×5): 3.375 g via INTRAVENOUS
  Filled 2014-11-14 (×6): qty 50

## 2014-11-14 MED ORDER — SODIUM CHLORIDE 0.9 % IV SOLN
INTRAVENOUS | Status: DC
  Administered 2014-11-14 – 2014-11-16 (×2): via INTRAVENOUS

## 2014-11-14 MED ORDER — FENTANYL CITRATE (PF) 100 MCG/2ML IJ SOLN
50.0000 ug | Freq: Once | INTRAMUSCULAR | Status: AC
  Administered 2014-11-14: 50 ug via INTRAVENOUS
  Filled 2014-11-14: qty 2

## 2014-11-14 MED ORDER — SODIUM CHLORIDE 0.9 % IV BOLUS (SEPSIS)
1000.0000 mL | Freq: Once | INTRAVENOUS | Status: AC
  Administered 2014-11-14: 1000 mL via INTRAVENOUS

## 2014-11-14 MED ORDER — PIPERACILLIN-TAZOBACTAM 3.375 G IVPB 30 MIN
3.3750 g | Freq: Once | INTRAVENOUS | Status: AC
  Administered 2014-11-14: 3.375 g via INTRAVENOUS
  Filled 2014-11-14: qty 50

## 2014-11-14 MED ORDER — FOLIC ACID 1 MG PO TABS
1.0000 mg | ORAL_TABLET | Freq: Every day | ORAL | Status: DC
  Administered 2014-11-15 – 2014-11-17 (×4): 1 mg via ORAL
  Filled 2014-11-14 (×4): qty 1

## 2014-11-14 MED ORDER — MORPHINE SULFATE 2 MG/ML IJ SOLN
2.0000 mg | Freq: Once | INTRAMUSCULAR | Status: AC
  Administered 2014-11-14: 2 mg via INTRAVENOUS
  Filled 2014-11-14: qty 1

## 2014-11-14 NOTE — ED Notes (Signed)
Dr. Thompson at bedside. 

## 2014-11-14 NOTE — Procedures (Signed)
Incision and Drainage Procedure Note  Pre-operative Diagnosis: Right back abscess  Post-operative Diagnosis: Right back abscess secondary to infected sebaceous cyst  Anesthesia: 1% lidocaine with epinephrine  Surgeon: Georganna Skeans, MD  Assist: Emily Filbert, PAS  Procedure Details  The procedure, risks and complications have been discussed in detail (including, but not limited to airway compromise, infection, bleeding) with the patient, and the patient has signed consent to the procedure.  The skin was sterilely prepped and draped over the affected area in the usual fashion. After adequate local anesthesia, I&D with a #11 blade was performed on the right back. Purulent drainage: present The patient was observed until stable.  Findings: Consistent with infected sebaceous cyst. Cultures were sent. Packed with iodoform.  Condition: Tolerated procedure well   Complications: none   Georganna Skeans, MD, MPH, FACS Trauma: [phone] General Surgery: [phone] .

## 2014-11-14 NOTE — H&P (Signed)
[facility] Admission History and Physical Service Pager: [phone]  Patient name: Bruce Franklin Medical record number: [id number] Date of birth: Jan 29, 1946 Age: 69 y.o. Gender: male  Primary Care Provider: No PCP Per Patient  Tamsen Roers ([place]) Consultants: General surgery Code Status: Full   Chief Complaint: Back pain and weakness  Assessment and Plan: Bruce Franklin is a 70 y.o. male presenting with infected sebaceous cysts . PMH is significant for Type 2 Diabetes (insulin dependent), COPD, anxiety, hx of prostatic adenocarcinoma (2007)    Sepsis likely due to infected sebaceous cyst. On admission, patient meeting Sepsis criteria  (HR104 and RR23, leukocytosis) with a source of infection. Patient was also hypotensive at 100/58. Patient received 2.5 Liters in the ED and blood pressure remained 101/59. CBC was signifcant for WBC 15.6. Lactic acid 1.7, wnl. Infected sebaceous cyst about 3 cm in size, has been I&D by surgery. UA unremarkable for infection. No evidence of COPD exacerbation or other pulmonary complaint. - admit to SDU for close monitoring, attending Dr. Gwendlyn Deutscher - Will monitor vitals: IVF bolus PRN hypotension - Will get AM CBC  - Will continue vancomycin and zosyn  - Will f/u on blood cultures, urine cultures, and wound culture  - Will start NS@ 100 ml/hr  - general surgery following, appreciate recommendations.  Infected Sebaceous cyst- about 3 cm in size, packed. Surgery was packing site after I&D. Chest CT showing Large abscess in the right upper back involving subcutaneous fat and underlying trapezius musculature.  - Continue to monitor  - Wound culture  - Surgery to follow  - Morphine 1mg  q2h for pain  - Stress the importance of smoking cessation and appropriate CBG control in wound healing  Diabetes mellitus, type 2: Patient does not check his CBGs daily (hasn't checked in several weeks because he knows when it is high  or low). Per report he was prescribed Humalog and metformin. Patient has never taken his humalog. UA positive >1000 glucose, 40 ketones. No anion gap and patient asymptomatic. CBGs  413, 337, 246  - Attempt to contact pharmacy: [facility] [phone] tomorrow concerning regimen - Held metformin- as patient received IV contrast; restart on discharge - Moderate SSI for now.  - CBGs meal, night and AM    COPD: Seldomly uses his puffers. Does not know what he takes. He believes he's taking Symbicort daily.  Consider contacting PCP [phone]) [phone] or his pharmacy as above. Does not appear to be in an exacerbation  - Symbicort daily  - continue to monitor breathing, [place] to keep O2 saturations >92% - Stress smoking cessation  Anxiety: Per patient, taking Xanax 1.0mg  TID. Will f/u with pharmacy vs PCP.  - Holding benzos while here given age. - If truly taking this, consider titrating down in the future given age and Beer's criteria. - Will get UDS  - CIWA protocol    Chronic back pain: patient is s/p 2 back surgeries and a prostatecomty. Per patient taking percocet at home. - Morphine as above.  Tobacco use: Smokes 1ppd since age of 29. - Nictoine Patch    FEN/GI:   NS @100cc /hr - Cardaic Diet: HH/Carb modified   Prophylaxis: Lovenox   Disposition: Step Down.   History of Present Illness: Bruce Franklin is a 68 y.o. male presenting with sepsis likely due to infected sebaceous cyst. Patient was transported to the ED via  EMS from home for evaluation of upper back pain and fatigue. History is  difficult to obtain as patient is a poor historian. Patient reports for the past 5-6 months he has notices swelling to his right upper back. For the past 5 days pain has increased.  He described pain as a dull and throbbing sensation, persistent, worsening with palpation. He endorsed chills over the last 5-6 day.  He endorsed feeling nauseous, with generalized fatigue for the past few days. Patient  denies significant fever, URI symptoms, chest pain, difficulty breathing, abdominal pain, vomiting, diarrhea, dysuria. Patient denies any recent injury. His son was concerned about the patient's weakness, therefore they called EMS.Prior to arrival, EMS reported patient was initially hypotensive with a systolic of 80 and a CBG of 416. Patient received 400 mg of normal saline which improved his pressure to 100/60 but remains tachycardic.   In the ED he was found to be tachycardic to the 100s, have BPs in the 100s/50s have an leukocytosis up to 15.6. CXR revealed emphysema without acute process. CT w/contrast revealed a large abscess measuring 7.2x3.6x6.2cm in the R upper back involving SQ fat and underlying trapezius muscle, hepatic steatosis, and persistent intrahepatic biliary ductal dilatation. Blood cultures, urine cultures, and wound cultures were obtained. He was started on Vanc/Zosyn per sepsis protocol. General surgery was c/s for management and performed and I&D with packing with iodoform.   Review Of Systems: Per HPI with the following additions:  Otherwise 12 point review of systems was performed and was unremarkable.  Patient Active Problem List   Diagnosis Date Noted  . Sepsis affecting skin 11/14/2014  . TOBACCO DEPENDENCE 06/09/2006  . NEPHROLITHIASIS 06/09/2006  . BACK PAIN, LOW 06/09/2006   Past Medical History: Past Medical History  Diagnosis Date  . Diabetes mellitus without complication   . COPD (chronic obstructive pulmonary disease)   . Anxiety   Seasonal allergies Prostate cancer  Past Surgical History: Past Surgical History  Procedure Laterality Date  . Lumbar fusion    Prostatectomy  Appendectomy  Social History: History  Substance Use Topics  . Smoking status: Not on file  . Smokeless tobacco: Not on file  . Alcohol Use: Not on file   Additional social history: smokes 1ppd, denies alcohol use (h/o drug use), denies drug use, lives with wife, son, and  grandchild Please also refer to relevant sections of EMR.  Family History: No family history on file. Allergies and Medications: No Known Allergies No current facility-administered medications on file prior to encounter.   No current outpatient prescriptions on file prior to encounter.    Objective: BP 105/64 mmHg  Pulse 88  Temp(Src) 98 F (36.7 C) (Oral)  Resp 16  Wt 172 lb (78.019 kg)  SpO2 93% Exam: General: Poor hygiene, elderly gentleman, NAD Eyes: Pupils Equal Round Reactive to light, Extraocular movements intact, Conjunctiva without redness or discharge ENTM: External nasal examination shows no deformity or inflammation. Nasal mucosa are pink and moist without lesions or exudates. No septal dislocation or dislocation.No obstruction to airflow. Neck: Normal ROM, No lymphadenopathy  Cardiovascular: Regular rate and rhythm.  No murmurs, gallops or rubs. No pitting edema. 2+ DP pulses bilaterally.  Respiratory: Normal respiratory effort, chest expands symmetrically. Lungs are clear to auscultation, no crackles or wheezes. Abdomen: Slightly protuberate on the right side, NTND, BS+  MSK: Moving all extremities No gross deformities noted.  Skin: Erythematous/warm circlular area over right scapula, with a incision 3 cm in size, packed with gauze. No drainage noted.  Neuro: Strength equal & normal in upper & lower extremities Psych: Alert  and orientated, appropriate   Labs and Imaging: CBC BMET   Recent Labs Lab 11/14/14 1535  WBC 15.6*  HGB 14.9  HCT 43.2  PLT 206    Recent Labs Lab 11/14/14 1535 11/14/14 1557  NA 133*  --   K 4.3  --   CL 102  --   CO2 18*  --   BUN 18  --   CREATININE 1.22 1.00  GLUCOSE 372*  --   CALCIUM 8.4*  --      Results for JARTAVIOUS, MCKIMMY (MRN [id number]) as of 11/14/2014 22:03  Ref. Range 11/14/2014 18:51  Appearance Latest Ref Range: CLEAR  CLEAR  Bilirubin Urine Latest Ref Range: NEGATIVE  NEGATIVE  Casts Latest Ref Range:  NEGATIVE  HYALINE CASTS (A)  Color, Urine Latest Ref Range: YELLOW  YELLOW  Glucose Latest Ref Range: NEGATIVE mg/dL >1000 (A)  Hgb urine dipstick Latest Ref Range: NEGATIVE  SMALL (A)  Ketones, ur Latest Ref Range: NEGATIVE mg/dL 40 (A)  Leukocytes, UA Latest Ref Range: NEGATIVE  NEGATIVE  Nitrite Latest Ref Range: NEGATIVE  NEGATIVE  pH Latest Ref Range: 5.0-8.0  5.0  Protein Latest Ref Range: NEGATIVE mg/dL 30 (A)  RBC / HPF Latest Ref Range: <3 RBC/hpf 0-2  Specific Gravity, Urine Latest Ref Range: 1.005-1.030  1.037 (H)  Urobilinogen, UA Latest Ref Range: 0.0-1.0 mg/dL 0.2   Lactic acid 1.7 >1.2  EKG: Sinus tachycardia, HR 116. Twave inversion I and aVL. Minimal ST elevation in II and III. QTc 476. No baseline to compare.  Ct Chest W Contrast  11/14/2014   CLINICAL DATA:  69 year old male with extreme fatigue for the past 2 days. Hypertension. Possible abscess in the right shoulder region.  EXAM: CT CHEST WITH CONTRAST  TECHNIQUE: Multidetector CT imaging of the chest was performed during intravenous contrast administration.  CONTRAST:  152mL OMNIPAQUE IOHEXOL 300 MG/ML  SOLN  COMPARISON:  Chest CT 07/30/2013.  FINDINGS: Mediastinum/Lymph Nodes: Heart size is normal. There is no significant pericardial fluid, thickening or pericardial calcification. No pathologically enlarged mediastinal or hilar lymph nodes. Esophagus is unremarkable in appearance. No axillary lymphadenopathy.  Lungs/Pleura: Mild diffuse bronchial wall thickening with moderate centrilobular and mild paraseptal emphysema. Focal area of architectural distortion and ground-glass attenuation in the left upper lobe and to a lesser extent in the superior segment of the left lower lobe, similar to prior study 07/30/2013, presumably an area of chronic post infectious or inflammatory scarring. Mild dependent scarring also noted in the lower lobes of the lungs bilaterally. No acute consolidative airspace disease. No pleural effusions.  No suspicious appearing pulmonary nodules or masses.  Upper Abdomen: Heterogeneous low attenuation throughout the hepatic parenchyma, compatible with hepatic steatosis. There is again some intrahepatic biliary ductal dilatation, which is most severe in the left lobe of the liver, similar to prior study 07/30/2013.  Musculoskeletal/Soft Tissues: 7.2 x 3.6 x 6.2 cm are fluid in gas containing lesion in the a soft tissues of the right upper back, which appears to involve subcutaneous tissues and some of the trapezius musculature, highly concerning for a large abscess. There are no aggressive appearing lytic or blastic lesions noted in the visualized portions of the skeleton.  IMPRESSION: 1. Large abscess in the right upper back involving subcutaneous fat and underlying trapezius musculature. 2. No other acute intracranial abnormalities. 3. Hepatic steatosis. 4. Persistent intrahepatic biliary ductal dilatation, similar to prior study 07/30/2013.   Electronically Signed   By: Vinnie Langton M.D.   On:  11/14/2014 18:37   Dg Chest Port 1 View  11/14/2014   CLINICAL DATA:  Sepsis and upper back pain.  EXAM: PORTABLE CHEST - 1 VIEW  COMPARISON:  CT chest 07/30/2013. Single view of the chest 07/29/2013.  FINDINGS: The lungs are emphysematous but clear. Heart size is normal. No pneumothorax or pleural effusion is identified.  IMPRESSION: Emphysema without acute disease.   Electronically Signed   By: Inge Rise M.D.   On: 11/14/2014 15:55     Archie Patten, MD 11/14/2014, 8:12 PM PGY-1, [place] Intern pager: [phone], text pages welcome   Upper Level Addendum:  I have seen and evaluated this patient along with Dr. Emmaline Life and reviewed the above note, making necessary revisions in purple.   Archie Patten, MD [place] Resident, PGY-2

## 2014-11-14 NOTE — Progress Notes (Signed)
ANTIBIOTIC CONSULT NOTE - INITIAL  Pharmacy Consult for vancomycin, zosyn  Indication: rule out sepsis/cellulitis  No Known Allergies  Patient Measurements: Weight: 172 lb (78.019 kg) Adjusted Body Weight:   Vital Signs: Temp: 98 F (36.7 C) (08/04 1508) Temp Source: Oral (08/04 1508) BP: 105/66 mmHg (08/04 1508) Pulse Rate: 114 (08/04 1508) Intake/Output from previous day:   Intake/Output from this shift:    Labs: No results for input(s): WBC, HGB, PLT, LABCREA, CREATININE in the last 72 hours. CrCl cannot be calculated (Unknown ideal weight.). No results for input(s): VANCOTROUGH, VANCOPEAK, VANCORANDOM, GENTTROUGH, GENTPEAK, GENTRANDOM, TOBRATROUGH, TOBRAPEAK, TOBRARND, AMIKACINPEAK, AMIKACINTROU, AMIKACIN in the last 72 hours.   Microbiology: No results found for this or any previous visit (from the past 720 hour(s)).  Medical History: Past Medical History  Diagnosis Date  . Diabetes mellitus without complication   . COPD (chronic obstructive pulmonary disease)   . Anxiety     Medications:   (Not in a hospital admission)   Assessment: 69 yo male admitted with back pain and fatigue. Antibiotics initially ordered for cellulitis, however pt then became a code sepsis.  WBC 15.6, SCr 1, afeb, BP soft, hr slightly tachy, eCrCl 65-70.  Goal of Therapy:  Vancomycin trough level 15-20 mcg/ml  Plan:  Zosyn 3.375 g IV q8h Vancomycin 1500 mg IV x1 then 1 g IV q12h Monitor renal fx, cultures, VT as needed, duration of therapy   Hughes Better, PharmD, BCPS Clinical Pharmacist Pager: [phone] 11/14/2014 3:33 PM

## 2014-11-14 NOTE — ED Notes (Addendum)
Per EMS, pt called them due to feeling extremely fatigued x 2days. Upon ems arrival pt was diaphoretic and appeared really tired. EMS reports pt was initial hypotensive with a systolic BP of 80. CBG:416. EMS gave 428ml of normal saline which improved the pts BP to 100/60. HR: 136. Pt alert x4. Pt is reporting lower back pain which is chronic. Pt also has what appears to be a 13cm x 8cm size indurated ecchymotic soft mass on his right shoulder blade.

## 2014-11-14 NOTE — ED Notes (Signed)
Have attempted to obtain urine sample and have been unsuccessful

## 2014-11-14 NOTE — ED Notes (Signed)
Report attempted x2

## 2014-11-14 NOTE — ED Notes (Signed)
Grandville Silos, MD cultured wound on back.

## 2014-11-14 NOTE — ED Notes (Signed)
Report attempted 

## 2014-11-14 NOTE — Consult Note (Signed)
Reason for Consult:back abscess Referring Physician: Domenic Moras, [facility]  Bruce Franklin is an 69 y.o. male.  HPI: Nicholaos complains of a mass on his right back for about 6 months. It is not giving him any pain until about 5 days ago with a started hurting. It is not draining anything out of the skin. The pain was increasing over the past 5 days. He then started feeling ill all over his body. He spoke with his son and his son called EMS. He is brought to the emergency department. He was found to have an abscess on his back confirmed by CT scan and we are asked to see him in consultation regarding management of this. He does have a history of diabetes and COPD.  Past Medical History  Diagnosis Date  . Diabetes mellitus without complication   . COPD (chronic obstructive pulmonary disease)   . Anxiety     Past Surgical History  Procedure Laterality Date  . Lumbar fusion      No family history on file.  Social History:  has no tobacco, alcohol, and drug history on file.  Allergies: No Known Allergies  Medications: Prior to Admission:  (Not in a hospital admission)  Results for orders placed or performed during the hospital encounter of 11/14/14 (from the past 48 hour(s))  CBG monitoring, ED     Status: Abnormal   Collection Time: 11/14/14  3:14 PM  Result Value Ref Range   Glucose-Capillary 413 (H) 65 - 99 mg/dL  Comprehensive metabolic panel     Status: Abnormal   Collection Time: 11/14/14  3:35 PM  Result Value Ref Range   Sodium 133 (L) 135 - 145 mmol/L   Potassium 4.3 3.5 - 5.1 mmol/L   Chloride 102 101 - 111 mmol/L   CO2 18 (L) 22 - 32 mmol/L   Glucose, Bld 372 (H) 65 - 99 mg/dL   BUN 18 6 - 20 mg/dL   Creatinine, Ser 1.22 0.61 - 1.24 mg/dL   Calcium 8.4 (L) 8.9 - 10.3 mg/dL   Total Protein 6.2 (L) 6.5 - 8.1 g/dL   Albumin 3.0 (L) 3.5 - 5.0 g/dL   AST 20 15 - 41 U/L   ALT 18 17 - 63 U/L   Alkaline Phosphatase 83 38 - 126 U/L   Total Bilirubin 1.3 (H) 0.3 - 1.2 mg/dL   GFR  calc non Af Amer 59 (L) >60 mL/min   GFR calc Af Amer >60 >60 mL/min    Comment: (NOTE) The eGFR has been calculated using the CKD EPI equation. This calculation has not been validated in all clinical situations. eGFR's persistently <60 mL/min signify possible Chronic Kidney Disease.    Anion gap 13 5 - 15  CBC WITH DIFFERENTIAL     Status: Abnormal   Collection Time: 11/14/14  3:35 PM  Result Value Ref Range   WBC 15.6 (H) 4.0 - 10.5 K/uL   RBC 4.84 4.22 - 5.81 MIL/uL   Hemoglobin 14.9 13.0 - 17.0 g/dL   HCT 43.2 39.0 - 52.0 %   MCV 89.3 78.0 - 100.0 fL   MCH 30.8 26.0 - 34.0 pg   MCHC 34.5 30.0 - 36.0 g/dL   RDW 13.0 11.5 - 15.5 %   Platelets 206 150 - 400 K/uL   Neutrophils Relative % 86 (H) 43 - 77 %   Neutro Abs 13.4 (H) 1.7 - 7.7 K/uL   Lymphocytes Relative 4 (L) 12 - 46 %   Lymphs Abs  0.6 (L) 0.7 - 4.0 K/uL   Monocytes Relative 10 3 - 12 %   Monocytes Absolute 1.6 (H) 0.1 - 1.0 K/uL   Eosinophils Relative 0 0 - 5 %   Eosinophils Absolute 0.0 0.0 - 0.7 K/uL   Basophils Relative 0 0 - 1 %   Basophils Absolute 0.0 0.0 - 0.1 K/uL  I-Stat CG4 Lactic Acid, ED  (not at  [facility])     Status: None   Collection Time: 11/14/14  3:57 PM  Result Value Ref Range   Lactic Acid, Venous 1.70 0.5 - 2.0 mmol/L  I-Stat Creatinine, ED (do not order at [facility])     Status: None   Collection Time: 11/14/14  3:57 PM  Result Value Ref Range   Creatinine, Ser 1.00 0.61 - 1.24 mg/dL  I-Stat Venous Blood Gas, ED (order at [facility] and [facility] only)     Status: Abnormal   Collection Time: 11/14/14  5:43 PM  Result Value Ref Range   pH, Ven 7.305 (H) 7.250 - 7.300   pCO2, Ven 34.6 (L) 45.0 - 50.0 mmHg   pO2, Ven 42.0 30.0 - 45.0 mmHg   Bicarbonate 17.2 (L) 20.0 - 24.0 mEq/L   TCO2 18 0 - 100 mmol/L   O2 Saturation 73.0 %   Acid-base deficit 8.0 (H) 0.0 - 2.0 mmol/L   Sample type VENOUS   CBG monitoring, ED     Status: Abnormal   Collection Time: 11/14/14  5:44 PM  Result Value Ref Range    Glucose-Capillary 317 (H) 65 - 99 mg/dL  I-Stat CG4 Lactic Acid, ED  (not at  [facility])     Status: None   Collection Time: 11/14/14  6:49 PM  Result Value Ref Range   Lactic Acid, Venous 1.20 0.5 - 2.0 mmol/L  Urinalysis, Routine w reflex microscopic (not at [facility])     Status: Abnormal   Collection Time: 11/14/14  6:51 PM  Result Value Ref Range   Color, Urine YELLOW YELLOW   APPearance CLEAR CLEAR   Specific Gravity, Urine 1.037 (H) 1.005 - 1.030   pH 5.0 5.0 - 8.0   Glucose, UA >1000 (A) NEGATIVE mg/dL   Hgb urine dipstick SMALL (A) NEGATIVE   Bilirubin Urine NEGATIVE NEGATIVE   Ketones, ur 40 (A) NEGATIVE mg/dL   Protein, ur 30 (A) NEGATIVE mg/dL   Urobilinogen, UA 0.2 0.0 - 1.0 mg/dL   Nitrite NEGATIVE NEGATIVE   Leukocytes, UA NEGATIVE NEGATIVE  Urine microscopic-add on     Status: Abnormal   Collection Time: 11/14/14  6:51 PM  Result Value Ref Range   RBC / HPF 0-2 <3 RBC/hpf   Casts HYALINE CASTS (A) NEGATIVE  POC CBG, ED     Status: Abnormal   Collection Time: 11/14/14  7:25 PM  Result Value Ref Range   Glucose-Capillary 337 (H) 65 - 99 mg/dL    Ct Chest W Contrast  11/14/2014   CLINICAL DATA:  69 year old male with extreme fatigue for the past 2 days. Hypertension. Possible abscess in the right shoulder region.  EXAM: CT CHEST WITH CONTRAST  TECHNIQUE: Multidetector CT imaging of the chest was performed during intravenous contrast administration.  CONTRAST:  190m OMNIPAQUE IOHEXOL 300 MG/ML  SOLN  COMPARISON:  Chest CT 07/30/2013.  FINDINGS: Mediastinum/Lymph Nodes: Heart size is normal. There is no significant pericardial fluid, thickening or pericardial calcification. No pathologically enlarged mediastinal or hilar lymph nodes. Esophagus is unremarkable in appearance. No axillary lymphadenopathy.  Lungs/Pleura: Mild diffuse bronchial  wall thickening with moderate centrilobular and mild paraseptal emphysema. Focal area of architectural distortion and ground-glass attenuation  in the left upper lobe and to a lesser extent in the superior segment of the left lower lobe, similar to prior study 07/30/2013, presumably an area of chronic post infectious or inflammatory scarring. Mild dependent scarring also noted in the lower lobes of the lungs bilaterally. No acute consolidative airspace disease. No pleural effusions. No suspicious appearing pulmonary nodules or masses.  Upper Abdomen: Heterogeneous low attenuation throughout the hepatic parenchyma, compatible with hepatic steatosis. There is again some intrahepatic biliary ductal dilatation, which is most severe in the left lobe of the liver, similar to prior study 07/30/2013.  Musculoskeletal/Soft Tissues: 7.2 x 3.6 x 6.2 cm are fluid in gas containing lesion in the a soft tissues of the right upper back, which appears to involve subcutaneous tissues and some of the trapezius musculature, highly concerning for a large abscess. There are no aggressive appearing lytic or blastic lesions noted in the visualized portions of the skeleton.  IMPRESSION: 1. Large abscess in the right upper back involving subcutaneous fat and underlying trapezius musculature. 2. No other acute intracranial abnormalities. 3. Hepatic steatosis. 4. Persistent intrahepatic biliary ductal dilatation, similar to prior study 07/30/2013.   Electronically Signed   By: Vinnie Langton M.D.   On: 11/14/2014 18:37   Dg Chest Port 1 View  11/14/2014   CLINICAL DATA:  Sepsis and upper back pain.  EXAM: PORTABLE CHEST - 1 VIEW  COMPARISON:  CT chest 07/30/2013. Single view of the chest 07/29/2013.  FINDINGS: The lungs are emphysematous but clear. Heart size is normal. No pneumothorax or pleural effusion is identified.  IMPRESSION: Emphysema without acute disease.   Electronically Signed   By: Inge Rise M.D.   On: 11/14/2014 15:55    Review of Systems  Constitutional: Positive for malaise/fatigue.  HENT: Negative.   Eyes: Negative.   Respiratory: Negative for cough  and shortness of breath.   Cardiovascular: Negative for chest pain and palpitations.  Gastrointestinal:       Some burning dyspepsia  Genitourinary: Negative.   Musculoskeletal:       See history of present illness  Skin:       See history of present illness  Neurological: Negative.   Endo/Heme/Allergies: Negative.   Psychiatric/Behavioral: The patient is nervous/anxious.    Blood pressure 96/62, pulse 87, temperature 98 F (36.7 C), temperature source Oral, resp. rate 20, weight 78.019 kg (172 lb), SpO2 92 %. Physical Exam  Constitutional: He is oriented to person, place, and time. He appears well-developed and well-nourished. No distress.  HENT:  Head: Normocephalic.  Right Ear: External ear normal.  Left Ear: External ear normal.  Eyes: EOM are normal. Pupils are equal, round, and reactive to light.  Neck: Neck supple.  Cardiovascular: Normal rate and normal heart sounds.   Respiratory: Effort normal and breath sounds normal. No respiratory distress. He has no wheezes. He has no rales.  GI: Soft. He exhibits no distension. There is no tenderness. There is no rebound and no guarding.  Musculoskeletal:  Right back has a 7 cm fluctuant area with localized tenderness consistent with abscess  Neurological: He is alert and oriented to person, place, and time.  Skin:  See above    Assessment/Plan: Right back abscess - will do incision and drainage in the emergency department and send cultures. Agree with medical admission for antibiotics and treatment of diabetes and other medical problems.  Kenry Daubert E  11/14/2014, 7:56 PM

## 2014-11-14 NOTE — ED Notes (Signed)
Pt requesting pain medication. Gertie Fey, [place] informed.

## 2014-11-14 NOTE — ED Provider Notes (Signed)
CSN: [id number]     Arrival date & time 11/14/14  1454 History   First MD Initiated Contact with Patient 11/14/14 1503     Chief Complaint  Patient presents with  . Fatigue  . Hyperglycemia     (Consider location/radiation/quality/duration/timing/severity/associated sxs/prior Treatment) The history is provided by the patient. No language interpreter was used.     69 year old male with history of insulin-dependent diabetes, COPD, anxiety, brought here via EMS from home for evaluation of upper back pain and fatigue. History is difficult to obtain as patient is a poor historian. Patient reports for the past 5 months he has notice swelling to his right upper back. For the past 6 days pain has increased. He described pain as a dull and throbbing sensation, persistent, worsening with palpation. He endorsed feeling nauseous, with generalized fatigue for the past few days. He also notice that his blood sugar has been running high, usually in the 300s in the past few weeks despite using his medication. He endorsed chronic low back pain from prior back surgery, which is not new. He reports seeing his primary care provider today for his upper shoulder pain and was given pain medication and anti-anxiety medication. Despite taking his medication he is not getting any better. Prior to arrival, EMS report patient was initially hypotensive with a systolic of 80 and a CBG of 416. Patient received 400 mg of normal saline which improved his pressure to 100/60 but remains tachycardic. Patient however denies significant fever, URI symptoms, chest pain, difficulty breathing, abdominal pain, vomiting, diarrhea, dysuria. Patient denies any recent injury.  No past medical history on file. No past surgical history on file. No family history on file. History  Substance Use Topics  . Smoking status: Not on file  . Smokeless tobacco: Not on file  . Alcohol Use: Not on file    Review of Systems  All other systems  reviewed and are negative.     Allergies  Review of patient's allergies indicates not on file.  Home Medications   Prior to Admission medications   Not on File   There were no vitals taken for this visit. Physical Exam  Constitutional: He is oriented to person, place, and time. He appears well-developed and well-nourished. No distress.  Frail-appearing Caucasian male, appears toxic.  HENT:  Head: Atraumatic.  Mouth is dry.  Eyes: Conjunctivae are normal.  Neck: Neck supple.  No nuchal rigidity.  Cardiovascular: Exam reveals no gallop and no friction rub.   No murmur heard. Tachycardia without murmurs or gallops.  Pulmonary/Chest: Effort normal and breath sounds normal.  Abdominal: Soft. There is no tenderness.  Neurological: He is alert and oriented to person, place, and time.  Skin: No rash noted.  Upper back: An indurated ecchymotic soft mass noted to right upper back that is tender to palpation with warmth but no significant erythema.  Psychiatric: He has a normal mood and affect.  Nursing note and vitals reviewed.   ED Course  Procedures (including critical care time)  Patient with history of diabetes presents with a master his right upper back that appears to be an abscess versus an infected epidermal cyst. He is septic. Code sepsis protocol initiated.  Initial SBP of 80s, initially improve to 100/60 with 479ml of NS.  Tachycardia improves from 136 to 117.  Work up initiated.  Care discussed with Dr. Rex Kras. I have initiated broad spectrum abx (vanc/zosyn).  4:19 PM  ED Sepsis - Repeat Assessment   Performed at:  November 14, 2014, 4:17 PM   Last Vitals:    Blood pressure 101/59, pulse 103, temperature 98 F (36.7 C), temperature source Oral, resp. rate 26, weight 172 lb (78.019 kg), SpO2 90 %.  Heart:      S1S2, mildly tachycardic  Lungs:     CTAB  Capillary Refill:   brisk  Peripheral Pulse (include location): Radial 2+, dorsalis pedis 2+   Skin (include  color):   Dry, pale  5:11 PM Blood pressure remains above 283 systolic. Mild tachycardia. Patient stills tachypneic and hovering around 90% on room air. Will place pt on [place] for O2 supplementation.  CXR without acute finding.  Doubt PE based on history.   6:35 PM Patient continues to endorse pain. Pain medication given. Blood pressure remains soft. Initial viewing of the CT scan showed a mass with subcutaneous air concerning for abscess. I have considered necrotizing fasciitis but low suspicion, however plan to consult general surgery once CT scan results populated.  7:18 PM Chest CT scan demonstrated a large abscess in the right upper back involving subcutaneous fat and underlying trapezius musculature. There is gas containing within the lesion. I appreciate consultation from general surgery, Dr. Grandville Silos who requests medicine admission and likely OR for surgical debridement.  7:28 PM Dr. Grandville Silos is currently evaluating pt and plan to perform I&D at bedside.  I will consult medicine for admission.    7:37 PM Appreciate consultation from [facility] Dr. Emmaline Life who agrees to see pt in ER.  She will admit pt to step down under her care.  Pt currently made NPO.  Pain medication given. Pt is aware of plan.  Holding orders placed. Pt meets sepsis criteria.  Critical care hrs documented.  CRITICAL CARE Performed by: Domenic Moras Total critical care time: 1 hr Critical care time was exclusive of separately billable procedures and treating other patients. Critical care was necessary to treat or prevent imminent or life-threatening deterioration. Critical care was time spent personally by me on the following activities: development of treatment plan with patient and/or surrogate as well as nursing, discussions with consultants, evaluation of patient's response to treatment, examination of patient, obtaining history from patient or surrogate, ordering and performing treatments and interventions, ordering and  review of laboratory studies, ordering and review of radiographic studies, pulse oximetry and re-evaluation of patient's condition.     Labs Review Labs Reviewed  COMPREHENSIVE METABOLIC PANEL - Abnormal; Notable for the following:    Sodium 133 (*)    CO2 18 (*)    Glucose, Bld 372 (*)    Calcium 8.4 (*)    Total Protein 6.2 (*)    Albumin 3.0 (*)    Total Bilirubin 1.3 (*)    GFR calc non Af Amer 59 (*)    All other components within normal limits  CBC WITH DIFFERENTIAL/PLATELET - Abnormal; Notable for the following:    WBC 15.6 (*)    Neutrophils Relative % 86 (*)    Neutro Abs 13.4 (*)    Lymphocytes Relative 4 (*)    Lymphs Abs 0.6 (*)    Monocytes Absolute 1.6 (*)    All other components within normal limits  URINALYSIS, ROUTINE W REFLEX MICROSCOPIC (NOT AT [facility]) - Abnormal; Notable for the following:    Specific Gravity, Urine 1.037 (*)    Glucose, UA >1000 (*)    Hgb urine dipstick SMALL (*)    Ketones, ur 40 (*)    Protein, ur 30 (*)    All other  components within normal limits  URINE MICROSCOPIC-ADD ON - Abnormal; Notable for the following:    Casts HYALINE CASTS (*)    All other components within normal limits  CBG MONITORING, ED - Abnormal; Notable for the following:    Glucose-Capillary 413 (*)    All other components within normal limits  CBG MONITORING, ED - Abnormal; Notable for the following:    Glucose-Capillary 317 (*)    All other components within normal limits  I-STAT VENOUS BLOOD GAS, ED - Abnormal; Notable for the following:    pH, Ven 7.305 (*)    pCO2, Ven 34.6 (*)    Bicarbonate 17.2 (*)    Acid-base deficit 8.0 (*)    All other components within normal limits  CBG MONITORING, ED - Abnormal; Notable for the following:    Glucose-Capillary 337 (*)    All other components within normal limits  CULTURE, BLOOD (ROUTINE X 2)  CULTURE, BLOOD (ROUTINE X 2)  URINE CULTURE  I-STAT CG4 LACTIC ACID, ED  I-STAT CREATININE, ED  I-STAT CG4 LACTIC  ACID, ED    Imaging Review Ct Chest W Contrast  11/14/2014   CLINICAL DATA:  69 year old male with extreme fatigue for the past 2 days. Hypertension. Possible abscess in the right shoulder region.  EXAM: CT CHEST WITH CONTRAST  TECHNIQUE: Multidetector CT imaging of the chest was performed during intravenous contrast administration.  CONTRAST:  139mL OMNIPAQUE IOHEXOL 300 MG/ML  SOLN  COMPARISON:  Chest CT 07/30/2013.  FINDINGS: Mediastinum/Lymph Nodes: Heart size is normal. There is no significant pericardial fluid, thickening or pericardial calcification. No pathologically enlarged mediastinal or hilar lymph nodes. Esophagus is unremarkable in appearance. No axillary lymphadenopathy.  Lungs/Pleura: Mild diffuse bronchial wall thickening with moderate centrilobular and mild paraseptal emphysema. Focal area of architectural distortion and ground-glass attenuation in the left upper lobe and to a lesser extent in the superior segment of the left lower lobe, similar to prior study 07/30/2013, presumably an area of chronic post infectious or inflammatory scarring. Mild dependent scarring also noted in the lower lobes of the lungs bilaterally. No acute consolidative airspace disease. No pleural effusions. No suspicious appearing pulmonary nodules or masses.  Upper Abdomen: Heterogeneous low attenuation throughout the hepatic parenchyma, compatible with hepatic steatosis. There is again some intrahepatic biliary ductal dilatation, which is most severe in the left lobe of the liver, similar to prior study 07/30/2013.  Musculoskeletal/Soft Tissues: 7.2 x 3.6 x 6.2 cm are fluid in gas containing lesion in the a soft tissues of the right upper back, which appears to involve subcutaneous tissues and some of the trapezius musculature, highly concerning for a large abscess. There are no aggressive appearing lytic or blastic lesions noted in the visualized portions of the skeleton.  IMPRESSION: 1. Large abscess in the right  upper back involving subcutaneous fat and underlying trapezius musculature. 2. No other acute intracranial abnormalities. 3. Hepatic steatosis. 4. Persistent intrahepatic biliary ductal dilatation, similar to prior study 07/30/2013.   Electronically Signed   By: Vinnie Langton M.D.   On: 11/14/2014 18:37   Dg Chest Port 1 View  11/14/2014   CLINICAL DATA:  Sepsis and upper back pain.  EXAM: PORTABLE CHEST - 1 VIEW  COMPARISON:  CT chest 07/30/2013. Single view of the chest 07/29/2013.  FINDINGS: The lungs are emphysematous but clear. Heart size is normal. No pneumothorax or pleural effusion is identified.  IMPRESSION: Emphysema without acute disease.   Electronically Signed   By: Inge Rise M.D.  On: 11/14/2014 15:55     EKG Interpretation None     ED ECG REPORT   Date: 11/14/2014  Rate: 116  Rhythm: sinus tachycardia  QRS Axis: normal  Intervals: normal  ST/T Wave abnormalities: nonspecific ST/T changes  Conduction Disutrbances:none  Narrative Interpretation:   Old EKG Reviewed: unchanged  I have personally reviewed the EKG tracing and agree with the computerized printout as noted.   MDM   Final diagnoses:  Sepsis affecting skin  Abscess of upper back excluding scapular region  Hyperglycemia    BP 96/62 mmHg  Pulse 87  Temp(Src) 98 F (36.7 C) (Oral)  Resp 20  Wt 172 lb (78.019 kg)  SpO2 92%  I have reviewed nursing notes and vital signs. I personally viewed the imaging tests through PACS system and agrees with radiologist's intepretation I reviewed available ER/hospitalization records through the EMR     Domenic Moras, PA-C 11/14/14 [place], PA-C 11/15/14 0011  Sharlett Iles, MD 11/15/14 [phone]

## 2014-11-15 ENCOUNTER — Encounter (HOSPITAL_COMMUNITY): Payer: Self-pay | Admitting: *Deleted

## 2014-11-15 DIAGNOSIS — E440 Moderate protein-calorie malnutrition: Secondary | ICD-10-CM | POA: Insufficient documentation

## 2014-11-15 DIAGNOSIS — L0291 Cutaneous abscess, unspecified: Secondary | ICD-10-CM

## 2014-11-15 LAB — CBC
HEMATOCRIT: 38.6 % — AB (ref 39.0–52.0)
HEMATOCRIT: 39.2 % (ref 39.0–52.0)
HEMOGLOBIN: 13.2 g/dL (ref 13.0–17.0)
Hemoglobin: 13.2 g/dL (ref 13.0–17.0)
MCH: 30.3 pg (ref 26.0–34.0)
MCH: 30.3 pg (ref 26.0–34.0)
MCHC: 33.7 g/dL (ref 30.0–36.0)
MCHC: 34.2 g/dL (ref 30.0–36.0)
MCV: 88.7 fL (ref 78.0–100.0)
MCV: 89.9 fL (ref 78.0–100.0)
Platelets: 174 10*3/uL (ref 150–400)
Platelets: 174 10*3/uL (ref 150–400)
RBC: 4.35 MIL/uL (ref 4.22–5.81)
RBC: 4.36 MIL/uL (ref 4.22–5.81)
RDW: 13.2 % (ref 11.5–15.5)
RDW: 13.3 % (ref 11.5–15.5)
WBC: 11.1 10*3/uL — ABNORMAL HIGH (ref 4.0–10.5)
WBC: 11.1 10*3/uL — ABNORMAL HIGH (ref 4.0–10.5)

## 2014-11-15 LAB — CREATININE, SERUM
CREATININE: 0.94 mg/dL (ref 0.61–1.24)
GFR calc Af Amer: >60 mL/min (ref >60)
GFR calc non Af Amer: >60 mL/min (ref >60)

## 2014-11-15 LAB — BASIC METABOLIC PANEL
Anion gap: 9 (ref 5–15)
BUN: 10 mg/dL (ref 6–20)
CO2: 21 mmol/L — ABNORMAL LOW (ref 22–32)
Calcium: 7.8 mg/dL — ABNORMAL LOW (ref 8.9–10.3)
Chloride: 103 mmol/L (ref 101–111)
Creatinine, Ser: 0.82 mg/dL (ref 0.61–1.24)
Glucose, Bld: 289 mg/dL — ABNORMAL HIGH (ref 65–99)
Potassium: 3.8 mmol/L (ref 3.5–5.1)
SODIUM: 133 mmol/L — AB (ref 135–145)

## 2014-11-15 LAB — HEPATIC FUNCTION PANEL
ALK PHOS: 57 U/L (ref 38–126)
ALT: 19 U/L (ref 17–63)
AST: 25 U/L (ref 15–41)
Albumin: 2.5 g/dL — ABNORMAL LOW (ref 3.5–5.0)
BILIRUBIN INDIRECT: 0.6 mg/dL (ref 0.3–0.9)
Bilirubin, Direct: 0.2 mg/dL (ref 0.1–0.5)
Total Bilirubin: 0.8 mg/dL (ref 0.3–1.2)
Total Protein: 5.2 g/dL — ABNORMAL LOW (ref 6.5–8.1)

## 2014-11-15 LAB — GLUCOSE, CAPILLARY
GLUCOSE-CAPILLARY: 338 mg/dL — AB (ref 65–99)
Glucose-Capillary: 288 mg/dL — ABNORMAL HIGH (ref 65–99)
Glucose-Capillary: 281 mg/dL — ABNORMAL HIGH (ref 65–99)
Glucose-Capillary: 198 mg/dL — ABNORMAL HIGH (ref 65–99)

## 2014-11-15 LAB — MRSA PCR SCREENING: MRSA by PCR: NEGATIVE

## 2014-11-15 MED ORDER — LORAZEPAM 1 MG PO TABS
1.0000 mg | ORAL_TABLET | Freq: Four times a day (QID) | ORAL | Status: DC | PRN
  Administered 2014-11-16 – 2014-11-17 (×2): 1 mg via ORAL
  Filled 2014-11-15 (×2): qty 1

## 2014-11-15 MED ORDER — ACETAMINOPHEN 325 MG PO TABS
650.0000 mg | ORAL_TABLET | Freq: Four times a day (QID) | ORAL | Status: DC | PRN
  Administered 2014-11-15: 650 mg via ORAL
  Filled 2014-11-15: qty 2

## 2014-11-15 MED ORDER — ALPRAZOLAM 0.5 MG PO TABS
0.5000 mg | ORAL_TABLET | Freq: Once | ORAL | Status: AC
Start: 2014-11-15 — End: 2014-11-15
  Administered 2014-11-15: 0.5 mg via ORAL
  Filled 2014-11-15: qty 1

## 2014-11-15 MED ORDER — HYDROCODONE-ACETAMINOPHEN 5-325 MG PO TABS
1.0000 | ORAL_TABLET | ORAL | Status: DC | PRN
  Administered 2014-11-15 – 2014-11-17 (×12): 2 via ORAL
  Filled 2014-11-15 (×12): qty 2

## 2014-11-15 MED ORDER — LORAZEPAM 2 MG/ML IJ SOLN
1.0000 mg | Freq: Four times a day (QID) | INTRAMUSCULAR | Status: DC | PRN
  Administered 2014-11-16: 1 mg via INTRAVENOUS
  Filled 2014-11-15 (×2): qty 1

## 2014-11-15 MED ORDER — MORPHINE SULFATE 2 MG/ML IJ SOLN
1.0000 mg | INTRAMUSCULAR | Status: DC | PRN
  Administered 2014-11-15: 2 mg via INTRAVENOUS
  Filled 2014-11-15 (×2): qty 1

## 2014-11-15 NOTE — Progress Notes (Addendum)
Received report from ED RN at 20:35.  Patient arrived at 20:55.  BP 92/55 at 21:22 and 87/57 at 21:59 - received orders for 1L NS fluid bolus.  Pt complaining of pain r/t right upper back abscess.  IV morphine administered once BP stabilized.

## 2014-11-15 NOTE — Progress Notes (Signed)
Inpatient Diabetes Program Recommendations  AACE/ADA: New Consensus Statement on Inpatient Glycemic Control (2013)  Target Ranges:  Prepandial:   less than 140 mg/dL      Peak postprandial:   less than 180 mg/dL (1-2 hours)      Critically ill patients:  140 - 180 mg/dL   Results for Bruce Franklin, Bruce Franklin (MRN [id number]) as of 11/15/2014 09:39  Ref. Range 11/14/2014 15:14 11/14/2014 17:44 11/14/2014 19:25 11/14/2014 21:56 11/15/2014 07:57  Glucose-Capillary Latest Ref Range: 65-99 mg/dL 413 (H) 317 (H) 337 (H) 246 (H) 288 (H)   Reason for Admission: Sepsis/ infected right upper back sebaceous abcess  Diabetes history: DM 2 Outpatient Diabetes medications: 75/25, Metformin 500 mg BID Current orders for Inpatient glycemic control: Novolog Moderate scale TID + HS scale  Inpatient Diabetes Program Recommendations Insulin - Basal: Patient takes 75/25 at home which has a basal insulin component in it. Due to hyperglycemia, please consider starting patient on Lantus 8 units Q24hrs (0.1 units/kg). HgbA1C: Patient having symptoms for the past 5 months. The infectious process/pain on patient's back may be contributing to patient's hyperglycemia over the past 3 months. Please order an A1c to assess glucose control over the past 2-3 months.   Thanks,  Tama Headings RN, MSN, [facility] Inpatient Diabetes Coordinator Team Pager [phone]

## 2014-11-15 NOTE — Progress Notes (Signed)
UR COMPLETED  

## 2014-11-15 NOTE — Progress Notes (Signed)
Attempted to call patient's pharmacy regarding prescribed drug regimen. Only open M-F 8 AM-12 PM.  Verner Mould, MD 11/15/2014, 4:17 PM PGY-1, [facility] Service pager [phone]

## 2014-11-15 NOTE — Progress Notes (Signed)
Visited Bruce Franklin. BP read at 2339 was 106/62. Patient was awake and resting comfortably with no complaints.

## 2014-11-15 NOTE — Discharge Summary (Signed)
[name] Discharge Summary  Patient name: Bruce Franklin Medical record number: [id number] Date of birth: 1945/09/29 Age: 69 y.o. Gender: male Date of Admission: 11/14/2014  Date of Discharge: 11/17/2014 Admitting Physician: Cristal Ford, DO  Primary Care Provider: No PCP Per Patient Consultants: general surgery  Indication for Hospitalization: back pain, weakness   Discharge Diagnoses/Problem List:  Right infected shoulder sebaceous cyst and abscess s/p I&D on 8/4 Type 2 Diabetes Patient Active Problem List   Diagnosis Date Noted  . Malnutrition of moderate degree 11/15/2014  . Sepsis affecting skin 11/14/2014  . Sepsis 11/14/2014  . TOBACCO DEPENDENCE 06/09/2006  . NEPHROLITHIASIS 06/09/2006  . BACK PAIN, LOW 06/09/2006    Disposition: home  Discharge Condition: stable  Brief Hospital Course:  Patient presented in sepsis likely secondary to an infected sebaceous cyst. Patient was also hypotensive in the ED with increased WBC and LA. Vanc and zosyn were started, and hypotensive responded to IVF.  Patient was found to have an infected sebaceous cyst on his back. Chest CT revealed large abscess involving subQ fat and trapezius muscle. General surgery performed bedside I&D. He was monitored in the hospital and transitioned to doxycycline on 8/6. On the day of discharge one of his blood cultures had grown back pansensitive e. coli and klebsiella for which he was started on 10 days of levaquin; since it was 1 of 2 bottles growing this it was deemed more likely to be a contaminant but with size and duration of abscess decided to treat.   Issues for Follow Up:  1. Patient will probably require definitive removal of cyst wall at later date. Will follow-up with Dr. Grandville Silos in two weeks. 2. Patient continues to smoke. Multiple providers stressed the importance of smoking cessation to help with wound healing and prevent further COPD worsening. Also stressed  importance of DM control to help with wound healing and other health conditions.  3. Patient will need HH to help with qd dressing changes, as he cannot reach the wound.  4. Pt discharged with the following diabetes meds: Humalog kwikpen 75-25 recommended he take 10 units twice a day rather than 15 units once a day; recommended f/u with PCP ASAP. 5. Decreased patient's Xanax dose given his age.  6. Discharged with home oxycodone-apap for back pain.  Significant Procedures: bedside I&D of right shoulder abscess/cyst  Significant Labs and Imaging:  CT - large abscess (~3 cm) in R upper back involving subQ fat and underlying trapezius musculature Wound culture - few gram positive cocci in pairs Blood culture: 1 of 2 bottles grew e. coli and klebsiella, pansensitive.  Recent Labs Lab 11/14/14 1535 11/14/14 2355 11/15/14 0527  WBC 15.6* 11.1* 11.1*  HGB 14.9 13.2 13.2  HCT 43.2 39.2 38.6*  PLT 206 174 174    Recent Labs Lab 11/14/14 1535 11/14/14 1557 11/14/14 2355 11/15/14 0527  NA 133*  --   --  133*  K 4.3  --   --  3.8  CL 102  --   --  103  CO2 18*  --   --  21*  GLUCOSE 372*  --   --  289*  BUN 18  --   --  10  CREATININE 1.22 1.00 0.94 0.82  CALCIUM 8.4*  --   --  7.8*  ALKPHOS 83  --  57  --   AST 20  --  25  --   ALT 18  --  19  --  ALBUMIN 3.0*  --  2.5*  --     Results/Tests Pending at Time of Discharge: final read of blood cultures, wound cultures  Discharge Medications:    Medication List    STOP taking these medications        ALPRAZolam 1 MG tablet  Commonly known as:  XANAX      TAKE these medications        aspirin EC 81 MG tablet  Take 81 mg by mouth daily.     doxycycline 100 MG capsule  Commonly known as:  VIBRAMYCIN  Take 1 capsule (100 mg total) by mouth 2 (two) times daily.     HUMALOG MIX 75/25 KWIKPEN (75-25) 100 UNIT/ML Kwikpen  Generic drug:  Insulin Lispro Prot & Lispro  Inject 10 Units into the skin 2 (two) times daily.  Before breakfast and before dinner     levofloxacin 750 MG tablet  Commonly known as:  LEVAQUIN  Take 1 tablet (750 mg total) by mouth daily.     metFORMIN 500 MG tablet  Commonly known as:  GLUCOPHAGE  Take 2 tablets (1,000 mg total) by mouth 2 (two) times daily.     oxyCODONE-acetaminophen 10-325 MG per tablet  Commonly known as:  PERCOCET  Take 1 tablet by mouth 3 (three) times daily.        Discharge Instructions: Please refer to Patient Instructions section of EMR for full details.  Patient was counseled important signs and symptoms that should prompt return to medical care, changes in medications, dietary instructions, activity restrictions, and follow up appointments.   Follow-Up Appointments:     Follow-up Information    Follow up with [facility], MD. Schedule an appointment as soon as possible for a visit in 2 weeks.   Specialty:  General Surgery   Why:  For post-operation check regarding your sebaceous cyst abscess   Contact information:   [place] [address] [phone]       Edward Trevino M Stephanee Barcomb, MD 11/15/2014, 4:58 PM PGY-3, [place]

## 2014-11-15 NOTE — Progress Notes (Signed)
Family Medicine Teaching Service Daily Progress Note Intern Pager: [phone]  Patient name: Bruce Franklin Medical record number: [id number] Date of birth: 1945/09/06 Age: 69 y.o. Gender: male  Primary Care Provider: No PCP Per Patient Consultants: General surgery Code Status: FULL  Pt Overview and Major Events to Date:  08/04 - Bedside I&D by gen surg 08/05 - Transferred from stepdown  Chief Complaint: Back pain and weakness  Assessment and Plan: Bruce Franklin is a 69 y.o. male presenting with infected sebaceous cysts . PMH is significant for Type 2 Diabetes (insulin dependent), COPD, anxiety, hx of prostatic adenocarcinoma (2007)   Sepsis - likely due to infected sebaceous cyst. Hypotensive in ID - not responsive to IVF. WBC 11.1. Lactic acid decreased to 1.2. Blood culture - gram variable rods. Urine culture - reincubated for better growth.  - Will monitor vitals: IVF bolus PRN hypotension - Will continue vancomycin and zosyn day #2 - Transition to PO abx tomorrow (08/06) for discharge - consider doxy for MRSA coverage - F/u on blood cultures, urine cultures  - Cont NS@ 100 ml/hr   Infected Sebaceous cyst- about 3 cm in size, packed. Surgery was packing site after I&D. Chest CT showing Large abscess in the right upper back involving subcutaneous fat and underlying trapezius musculature. Patient will probably need definitive removal of cyst wall at later date. Wound culture - few gram positive cocci in pairs - D/c morphine in prep for discharge; PO Norco for pain - Stress the importance of smoking cessation and appropriate CBG control in wound healing - F/u with Dr. Grandville Silos in 2 weeks - Per surgery, can d/c when pain is controlled with PO meds  - Continue BID dressing changes   Diabetes mellitus, type 2: Patient does not check his CBGs daily (hasn't checked in several weeks because he knows when it is high or low). Per report he was prescribed Humalog and metformin. Patient  has never taken his humalog. UA positive >1000 glucose, 40 ketones. No anion gap and patient asymptomatic. CBGsimproved to high 200s. - Attempt to contact pharmacy: [facility] [phone] tomorrow concerning regimen - Held metformin- as patient received IV contrast; restart on discharge - Moderate SSI for now.  - CBGs meal, night and AM  - A1C pending  COPD: Seldomly uses his puffers. Does not know what he takes. He believes he's taking Symbicort daily.  Consider contacting PCP [phone] or his pharmacy as above. Does not appear to be in an exacerbation  - Symbicort daily  - continue to monitor breathing, [place] to keep O2 saturations >92% - Stress smoking cessation  Anxiety: Per patient, taking Xanax 1.0mg  TID. Will f/u with pharmacy vs PCP. UDS positive for opiates and benzos. - Holding benzos while here given age. - If truly taking this, consider titrating down in the future given age and Beer's criteria. - CIWA protocol   Chronic back pain: patient is s/p 2 back surgeries and a prostatecomty. Per patient taking percocet at home. - PO Norco  Tobacco use: Smokes 1ppd since age of 12. - Nictoine Patch   FEN/GI: NS @100cc /hr; HH/Carb modified  Prophylaxis: Lovenox   Disposition: Step Down.   Subjective:  Patient endorses back pain at site of I&D, but no pain otherwise. He denies chest pain or difficulty breathing. He does have abdominal tenderness on palpation, but says "that is there all the time."   Objective: Temp:  [97.8 F (36.6 C)-101.5 F (38.6 C)] 98.5 F (36.9 C) (08/05 1438)  Pulse Rate:  [78-108] 82 (08/05 1438) Resp:  [16-32] 28 (08/05 0724) BP: (87-126)/(53-68) 108/53 mmHg (08/05 1438) SpO2:  [90 %-98 %] 95 % (08/05 0738) Weight:  [166 lb 14.2 oz (75.7 kg)-172 lb (78.019 kg)] 166 lb 14.2 oz (75.7 kg) (08/04 2114) Physical Exam: General: lying in bed eating breakfast in NAD Cardiovascular: RRR, no murmurs appreciated Respiratory: CTAB, no  wheezes Abdomen: TTP of RUQ, non-distended, soft, +BS Neuro: A&Ox3, no focal deficits Psych: appropriate mood and affect   Laboratory:  Recent Labs Lab 11/14/14 1535 11/14/14 2355 11/15/14 0527  WBC 15.6* 11.1* 11.1*  HGB 14.9 13.2 13.2  HCT 43.2 39.2 38.6*  PLT 206 174 174    Recent Labs Lab 11/14/14 1535 11/14/14 1557 11/14/14 2355 11/15/14 0527  NA 133*  --   --  133*  K 4.3  --   --  3.8  CL 102  --   --  103  CO2 18*  --   --  21*  BUN 18  --   --  10  CREATININE 1.22 1.00 0.94 0.82  CALCIUM 8.4*  --   --  7.8*  PROT 6.2*  --  5.2*  --   BILITOT 1.3*  --  0.8  --   ALKPHOS 83  --  57  --   ALT 18  --  19  --   AST 20  --  25  --   GLUCOSE 372*  --   --  289*    Imaging/Diagnostic Tests: Ct Chest W Contrast  11/14/2014   CLINICAL DATA:  69 year old male with extreme fatigue for the past 2 days. Hypertension. Possible abscess in the right shoulder region.  EXAM: CT CHEST WITH CONTRAST  TECHNIQUE: Multidetector CT imaging of the chest was performed during intravenous contrast administration.  CONTRAST:  112mL OMNIPAQUE IOHEXOL 300 MG/ML  SOLN  COMPARISON:  Chest CT 07/30/2013.  FINDINGS: Mediastinum/Lymph Nodes: Heart size is normal. There is no significant pericardial fluid, thickening or pericardial calcification. No pathologically enlarged mediastinal or hilar lymph nodes. Esophagus is unremarkable in appearance. No axillary lymphadenopathy.  Lungs/Pleura: Mild diffuse bronchial wall thickening with moderate centrilobular and mild paraseptal emphysema. Focal area of architectural distortion and ground-glass attenuation in the left upper lobe and to a lesser extent in the superior segment of the left lower lobe, similar to prior study 07/30/2013, presumably an area of chronic post infectious or inflammatory scarring. Mild dependent scarring also noted in the lower lobes of the lungs bilaterally. No acute consolidative airspace disease. No pleural effusions. No suspicious  appearing pulmonary nodules or masses.  Upper Abdomen: Heterogeneous low attenuation throughout the hepatic parenchyma, compatible with hepatic steatosis. There is again some intrahepatic biliary ductal dilatation, which is most severe in the left lobe of the liver, similar to prior study 07/30/2013.  Musculoskeletal/Soft Tissues: 7.2 x 3.6 x 6.2 cm are fluid in gas containing lesion in the a soft tissues of the right upper back, which appears to involve subcutaneous tissues and some of the trapezius musculature, highly concerning for a large abscess. There are no aggressive appearing lytic or blastic lesions noted in the visualized portions of the skeleton.  IMPRESSION: 1. Large abscess in the right upper back involving subcutaneous fat and underlying trapezius musculature. 2. No other acute intracranial abnormalities. 3. Hepatic steatosis. 4. Persistent intrahepatic biliary ductal dilatation, similar to prior study 07/30/2013.   Electronically Signed   By: Vinnie Langton M.D.   On: 11/14/2014 18:37   Dg  Chest Port 1 View  11/14/2014   CLINICAL DATA:  Sepsis and upper back pain.  EXAM: PORTABLE CHEST - 1 VIEW  COMPARISON:  CT chest 07/30/2013. Single view of the chest 07/29/2013.  FINDINGS: The lungs are emphysematous but clear. Heart size is normal. No pneumothorax or pleural effusion is identified.  IMPRESSION: Emphysema without acute disease.   Electronically Signed   By: Inge Rise M.D.   On: 11/14/2014 15:55     Verner Mould, MD 11/15/2014, 3:15 PM PGY-1, [place] Intern pager: [phone], text pages welcome

## 2014-11-15 NOTE — Progress Notes (Signed)
[facility] Progress Note     Subjective: Pt doing much better, he feels stronger.  No N/V.  Tolerating diet.  Not ambulated yet.  No complaints except political complaints.  Objective: Vital signs in last 24 hours: Temp:  [97.8 F (36.6 C)-101.5 F (38.6 C)] 98.8 F (37.1 C) (08/05 0724) Pulse Rate:  [78-114] 95 (08/05 0724) Resp:  [13-32] 28 (08/05 0724) BP: (87-126)/(55-81) 111/56 mmHg (08/05 0724) SpO2:  [90 %-98 %] 95 % (08/05 0738) Weight:  [75.7 kg (166 lb 14.2 oz)-78.019 kg (172 lb)] 75.7 kg (166 lb 14.2 oz) (08/04 2114) Last BM Date: 11/14/14  Intake/Output from previous day: 08/04 0701 - 08/05 0700 In: 4693.3 [P.O.:480; I.V.:3913.3; IV Piggyback:300] Out: 2075 [Urine:2075] Intake/Output this shift:    PE: Gen:  Alert, NAD, pleasant Upper back:  2cm incision with purulent drainage, packing removed and will be replaced by the nurse once packing arrives.  Erythema to about 5cm in diameter (receding), less tender to palpation   Lab Results:   Recent Labs  11/14/14 2355 11/15/14 0527  WBC 11.1* 11.1*  HGB 13.2 13.2  HCT 39.2 38.6*  PLT 174 174   BMET  Recent Labs  11/14/14 1535  11/14/14 2355 11/15/14 0527  NA 133*  --   --  133*  K 4.3  --   --  3.8  CL 102  --   --  103  CO2 18*  --   --  21*  GLUCOSE 372*  --   --  289*  BUN 18  --   --  10  CREATININE 1.22  < > 0.94 0.82  CALCIUM 8.4*  --   --  7.8*  < > = values in this interval not displayed. PT/INR No results for input(s): LABPROT, INR in the last 72 hours. CMP     Component Value Date/Time   NA 133* 11/15/2014 0527   K 3.8 11/15/2014 0527   CL 103 11/15/2014 0527   CO2 21* 11/15/2014 0527   GLUCOSE 289* 11/15/2014 0527   BUN 10 11/15/2014 0527   CREATININE 0.82 11/15/2014 0527   CALCIUM 7.8* 11/15/2014 0527   PROT 5.2* 11/14/2014 2355   ALBUMIN 2.5* 11/14/2014 2355   AST 25 11/14/2014 2355   ALT 19 11/14/2014 2355   ALKPHOS 57 11/14/2014 2355   BILITOT 0.8  11/14/2014 2355   GFRNONAA >60 11/15/2014 0527   GFRAA >60 11/15/2014 0527   Lipase  No results found for: LIPASE     Studies/Results: Ct Chest W Contrast  11/14/2014   CLINICAL DATA:  69 year old male with extreme fatigue for the past 2 days. Hypertension. Possible abscess in the right shoulder region.  EXAM: CT CHEST WITH CONTRAST  TECHNIQUE: Multidetector CT imaging of the chest was performed during intravenous contrast administration.  CONTRAST:  127mL OMNIPAQUE IOHEXOL 300 MG/ML  SOLN  COMPARISON:  Chest CT 07/30/2013.  FINDINGS: Mediastinum/Lymph Nodes: Heart size is normal. There is no significant pericardial fluid, thickening or pericardial calcification. No pathologically enlarged mediastinal or hilar lymph nodes. Esophagus is unremarkable in appearance. No axillary lymphadenopathy.  Lungs/Pleura: Mild diffuse bronchial wall thickening with moderate centrilobular and mild paraseptal emphysema. Focal area of architectural distortion and ground-glass attenuation in the left upper lobe and to a lesser extent in the superior segment of the left lower lobe, similar to prior study 07/30/2013, presumably an area of chronic post infectious or inflammatory scarring. Mild dependent scarring also noted in the lower lobes of the lungs bilaterally.  No acute consolidative airspace disease. No pleural effusions. No suspicious appearing pulmonary nodules or masses.  Upper Abdomen: Heterogeneous low attenuation throughout the hepatic parenchyma, compatible with hepatic steatosis. There is again some intrahepatic biliary ductal dilatation, which is most severe in the left lobe of the liver, similar to prior study 07/30/2013.  Musculoskeletal/Soft Tissues: 7.2 x 3.6 x 6.2 cm are fluid in gas containing lesion in the a soft tissues of the right upper back, which appears to involve subcutaneous tissues and some of the trapezius musculature, highly concerning for a large abscess. There are no aggressive appearing  lytic or blastic lesions noted in the visualized portions of the skeleton.  IMPRESSION: 1. Large abscess in the right upper back involving subcutaneous fat and underlying trapezius musculature. 2. No other acute intracranial abnormalities. 3. Hepatic steatosis. 4. Persistent intrahepatic biliary ductal dilatation, similar to prior study 07/30/2013.   Electronically Signed   By: Vinnie Langton M.D.   On: 11/14/2014 18:37   Dg Chest Port 1 View  11/14/2014   CLINICAL DATA:  Sepsis and upper back pain.  EXAM: PORTABLE CHEST - 1 VIEW  COMPARISON:  CT chest 07/30/2013. Single view of the chest 07/29/2013.  FINDINGS: The lungs are emphysematous but clear. Heart size is normal. No pneumothorax or pleural effusion is identified.  IMPRESSION: Emphysema without acute disease.   Electronically Signed   By: Inge Rise M.D.   On: 11/14/2014 15:55    Anti-infectives: Anti-infectives    Start     Dose/Rate Route Frequency Ordered Stop   11/15/14 0400  vancomycin (VANCOCIN) IVPB 1000 mg/200 mL premix     1,000 mg 200 mL/hr over 60 Minutes Intravenous Every 12 hours 11/14/14 1653     11/14/14 2300  piperacillin-tazobactam (ZOSYN) IVPB 3.375 g     3.375 g 12.5 mL/hr over 240 Minutes Intravenous 3 times per day 11/14/14 1653     11/14/14 1545  vancomycin (VANCOCIN) 1,500 mg in sodium chloride 0.9 % 500 mL IVPB     1,500 mg 250 mL/hr over 120 Minutes Intravenous  Once 11/14/14 1532 11/14/14 1917   11/14/14 1530  piperacillin-tazobactam (ZOSYN) IVPB 3.375 g     3.375 g 100 mL/hr over 30 Minutes Intravenous  Once 11/14/14 1522 11/14/14 1635   11/14/14 1530  vancomycin (VANCOCIN) IVPB 1000 mg/200 mL premix  Status:  Discontinued     1,000 mg 200 mL/hr over 60 Minutes Intravenous  Once 11/14/14 1522 11/14/14 1532       Assessment/Plan Right back abscess 2* infected sebaceous cyst -Dressing changes BID, 1/4 inch Iodoform gauze packing.  Will likely need [place] nursing care at discharge and help changing his  dressings at home since he wont be able to reach it.  Dressings should be slowed to daily once discharged. -IV Zosyn/vancomycin Day #2 -Pending blood cultures, don't see wound cultures -Home when tolerating dressing changes with orals -May need definitive removal of cyst wall at later date once infection healed.  He can follow up with Dr. Grandville Silos in 2-3 weeks. -Ice prn, encourage orals -HH diet -Inpatient today, consider discharge tomorrow if pain well controlled.  Okay to go to floor from our perspective.     LOS: 1 day    Nat Christen 11/15/2014, 8:50 AM Pager: [phone]

## 2014-11-15 NOTE — Progress Notes (Signed)
Initial Nutrition Assessment  DOCUMENTATION CODES:   Non-severe (moderate) malnutrition in context of chronic illness  INTERVENTION:   -Continue Ensure Enlive po BID, each supplement provides 350 kcal and 20 grams of protein -Continue MVI daily  NUTRITION DIAGNOSIS:   Increased nutrient needs related to wound healing as evidenced by estimated needs.  GOAL:   Patient will meet greater than or equal to 90% of their needs  MONITOR:   PO intake, Supplement acceptance, Labs, Weight trends, Skin, I & O's  REASON FOR ASSESSMENT:   Malnutrition Screening Tool    ASSESSMENT:   Bruce Franklin is a 69 y.o. male presenting with infected sebaceous cysts . PMH is significant for Type 2 Diabetes (insulin dependent), COPD, anxiety, hx of prostatic adenocarcinoma (2007)   Pt admitted with sepsis, likely related to infected sebaceous cysts.   Pt s/p I&D of rt back abscess on 11/14/14.   Spoke with RN, who reports pt has a good appetite, however, did not each much lunch due to consuming and Ensure shortly prior to lunch tray being ordered. Noted pt consumed less than 25% of his lunch tray, but drank about 75% of Ensure bottle on bedside table.   Hx obtained by pt at bedside. He reports prolonged wt loss over the past 8 years, due to multiple medical problems including cancer. He reports UBW of 180#, which he estimates he last weighed about 6 months ago. Unfortunately there is no wt hx to confirm wt loss.   Pt reports appetite is usually very good. He denies chewing or swallowing difficulties despite missing multiple teeth. He likes Ensure supplements, but does not consume at home due to limited financial resources. No recent Hgb A1c available, however, pt reports good control of DM at home, readings usually ranging in the 150's.   Pt reports he is very anxious to go home. Discussed importance of good PO intake to support healing process.  Nutrition-Focused physical exam completed. Findings  are mild to moderate fat depletion, mild to moderate muscle depletion, and no edema.   Diet Order:  Diet heart healthy/carb modified Room service appropriate?: Yes; Fluid consistency:: Thin  Skin:  Wound (see comment) (rt back abscess)  Last BM:  11/14/14  Height:   Ht Readings from Last 1 Encounters:  11/14/14 6\' 2"  (1.88 m)    Weight:   Wt Readings from Last 1 Encounters:  11/14/14 166 lb 14.2 oz (75.7 kg)    Ideal Body Weight:  86.4 kg  BMI:  Body mass index is 21.42 kg/(m^2).  Estimated Nutritional Needs:   Kcal:  2100-2300  Protein:  100-115 grams  Fluid:  2.1-2.3 L  EDUCATION NEEDS:   Education needs addressed  Lieutenant Abarca A. Jimmye Norman, RD, LDN, CDE Pager: [phone] After hours Pager: [phone]

## 2014-11-16 DIAGNOSIS — E119 Type 2 diabetes mellitus without complications: Secondary | ICD-10-CM

## 2014-11-16 DIAGNOSIS — A419 Sepsis, unspecified organism: Principal | ICD-10-CM

## 2014-11-16 DIAGNOSIS — L02212 Cutaneous abscess of back [any part, except buttock]: Secondary | ICD-10-CM

## 2014-11-16 LAB — HEMOGLOBIN A1C
Hgb A1c MFr Bld: 12 % — ABNORMAL HIGH (ref 4.8–5.6)
Mean Plasma Glucose: 298 mg/dL

## 2014-11-16 LAB — GLUCOSE, CAPILLARY
GLUCOSE-CAPILLARY: 266 mg/dL — AB (ref 65–99)
GLUCOSE-CAPILLARY: 274 mg/dL — AB (ref 65–99)
Glucose-Capillary: 207 mg/dL — ABNORMAL HIGH (ref 65–99)
Glucose-Capillary: 148 mg/dL — ABNORMAL HIGH (ref 65–99)

## 2014-11-16 LAB — URINE CULTURE

## 2014-11-16 MED ORDER — DOXYCYCLINE HYCLATE 100 MG PO TABS
100.0000 mg | ORAL_TABLET | Freq: Two times a day (BID) | ORAL | Status: DC
  Administered 2014-11-16 – 2014-11-17 (×3): 100 mg via ORAL
  Filled 2014-11-16 (×3): qty 1

## 2014-11-16 MED ORDER — DOXYCYCLINE HYCLATE 100 MG PO CAPS: 100.0000 mg | ORAL_CAPSULE | Freq: Two times a day (BID) | ORAL | Status: DC

## 2014-11-16 MED ORDER — INSULIN GLARGINE 100 UNIT/ML ~~LOC~~ SOLN
7.0000 [IU] | Freq: Every day | SUBCUTANEOUS | Status: DC
  Administered 2014-11-16 – 2014-11-17 (×2): 7 [IU] via SUBCUTANEOUS
  Filled 2014-11-16 (×2): qty 0.07

## 2014-11-16 MED ORDER — METFORMIN HCL 500 MG PO TABS: 1000.0000 mg | ORAL_TABLET | Freq: Two times a day (BID) | ORAL | Status: DC

## 2014-11-16 NOTE — Progress Notes (Signed)
Family Medicine Teaching Service Daily Progress Note Intern Pager: [phone]  Patient name: Bruce Franklin Medical record number: [id number] Date of birth: 03-Oct-1945 Age: 69 y.o. Gender: male  Primary Care Provider: No PCP Per Patient Consultants: General surgery Code Status: FULL  Pt Overview and Major Events to Date:  08/04 - Bedside I&D by gen surg 08/05 - Transferred from stepdown 8/6 - plan to DC pending blood cx and PO abx tolerance   Chief Complaint: Back pain and weakness  Assessment and Plan: Bruce Franklin is a 69 y.o. male presenting with infected sebaceous cysts. PMH is significant for Type 2 Diabetes (insulin dependent), COPD, anxiety, hx of prostatic adenocarcinoma (2007)   # Sepsis - likely due to infected sebaceous cyst; resolved. Hypotensive in ID - not responsive to IVF. WBC 11.1. Lactic acid decreased to 1.2. Blood culture - gram variable rods. Urine culture - reincubated for better growth.  - Will monitor vitals: IVF bolus PRN hypotension - Transition to PO doxy for MRSA coverage - F/u on blood cultures, urine cultures >> can DC when NG after 48hr - KVO  # Infected Sebaceous cyst- about 3 cm in size, packed. Chest CT showing Large abscess. Surgery will likely require another excision to remove capsule. Wound culture - few gram positive cocci in pairs - PO Norco for pain - Encourage CBG control for wound healing - F/u with Dr. Grandville Silos in 2 weeks - Per surgery, can d/c   - Continue BID dressing changes  # Diabetes mellitus, type 2: Patient does not check his CBGs daily. Prescribed Humalog and metformin. Patient has never taken his humalog. No anion gap and patient asymptomatic. CBGsimproved to high 200s. - Holding metformin: (received IV contrast) restart on discharge >> strong consideration to increase dosage. - Moderate SSI for now.  - CBGs meal, night and AM - A1C 12.0  - Lantus 7u QD initiated  - DM education ordered  # COPD: Noncompliance w/  inhalers. Does not know what he takes. He believes he's taking Symbicort daily.  - Symbicort daily  - continue to monitor breathing, [place] to keep O2 saturations >92% - Stress smoking cessation  # Anxiety: Per patient, takes Xanax.  UDS positive for opiates and benzos. - Holding benzos while here given age. - If truly taking this, consider titrating down in the future given age and Beer's criteria. - CIWA protocol>> 8 at 2am 8/6 >> otherwise zeros  # Chronic back pain: patient is s/p 2 back surgeries and a prostatecomty. Per patient taking percocet at home. - PO Norco  # Tobacco use: Smokes 1ppd since age of 67. - Nictoine Patch   FEN/GI: NS @100cc /hr; HH/Carb modified  Prophylaxis: Lovenox   Disposition: Step Down.   Subjective:  Patient states he feels significantly better this AM. Very pleased to hear he may be DCd later today. No acute events overnight  Objective: Temp:  [98.2 F (36.8 C)-99.3 F (37.4 C)] 99 F (37.2 C) (08/06 0501) Pulse Rate:  [78-95] 95 (08/06 0501) Resp:  [16-19] 16 (08/06 0501) BP: (100-110)/(53-62) 109/62 mmHg (08/06 0501) SpO2:  [93 %-98 %] 93 % (08/06 1014) Physical Exam: General: lying in bed eating breakfast in NAD Integument: Rt posterior upper thoracic lesion noted >> packed and dressed; erythematous perimeter Cardiovascular: RRR, no murmurs appreciated Respiratory: CTAB, no wheezes Abdomen: TTP of RUQ, non-distended, soft, +BS Neuro: A&Ox3, no focal deficits Psych: appropriate mood and affect   Laboratory:  Recent Labs Lab 11/14/14 1535 11/14/14 2355 11/15/14 0527  WBC 15.6* 11.1* 11.1*  HGB 14.9 13.2 13.2  HCT 43.2 39.2 38.6*  PLT 206 174 174    Recent Labs Lab 11/14/14 1535 11/14/14 1557 11/14/14 2355 11/15/14 0527  NA 133*  --   --  133*  K 4.3  --   --  3.8  CL 102  --   --  103  CO2 18*  --   --  21*  BUN 18  --   --  10  CREATININE 1.22 1.00 0.94 0.82  CALCIUM 8.4*  --   --  7.8*  PROT 6.2*  --  5.2*  --    BILITOT 1.3*  --  0.8  --   ALKPHOS 83  --  57  --   ALT 18  --  19  --   AST 20  --  25  --   GLUCOSE 372*  --   --  289*    Imaging/Diagnostic Tests: Ct Chest W Contrast  11/14/2014   CLINICAL DATA:  69 year old male with extreme fatigue for the past 2 days. Hypertension. Possible abscess in the right shoulder region.  EXAM: CT CHEST WITH CONTRAST  TECHNIQUE: Multidetector CT imaging of the chest was performed during intravenous contrast administration.  CONTRAST:  116mL OMNIPAQUE IOHEXOL 300 MG/ML  SOLN  COMPARISON:  Chest CT 07/30/2013.  FINDINGS: Mediastinum/Lymph Nodes: Heart size is normal. There is no significant pericardial fluid, thickening or pericardial calcification. No pathologically enlarged mediastinal or hilar lymph nodes. Esophagus is unremarkable in appearance. No axillary lymphadenopathy.  Lungs/Pleura: Mild diffuse bronchial wall thickening with moderate centrilobular and mild paraseptal emphysema. Focal area of architectural distortion and ground-glass attenuation in the left upper lobe and to a lesser extent in the superior segment of the left lower lobe, similar to prior study 07/30/2013, presumably an area of chronic post infectious or inflammatory scarring. Mild dependent scarring also noted in the lower lobes of the lungs bilaterally. No acute consolidative airspace disease. No pleural effusions. No suspicious appearing pulmonary nodules or masses.  Upper Abdomen: Heterogeneous low attenuation throughout the hepatic parenchyma, compatible with hepatic steatosis. There is again some intrahepatic biliary ductal dilatation, which is most severe in the left lobe of the liver, similar to prior study 07/30/2013.  Musculoskeletal/Soft Tissues: 7.2 x 3.6 x 6.2 cm are fluid in gas containing lesion in the a soft tissues of the right upper back, which appears to involve subcutaneous tissues and some of the trapezius musculature, highly concerning for a large abscess. There are no  aggressive appearing lytic or blastic lesions noted in the visualized portions of the skeleton.  IMPRESSION: 1. Large abscess in the right upper back involving subcutaneous fat and underlying trapezius musculature. 2. No other acute intracranial abnormalities. 3. Hepatic steatosis. 4. Persistent intrahepatic biliary ductal dilatation, similar to prior study 07/30/2013.   Electronically Signed   By: Vinnie Langton M.D.   On: 11/14/2014 18:37   Dg Chest Port 1 View  11/14/2014   CLINICAL DATA:  Sepsis and upper back pain.  EXAM: PORTABLE CHEST - 1 VIEW  COMPARISON:  CT chest 07/30/2013. Single view of the chest 07/29/2013.  FINDINGS: The lungs are emphysematous but clear. Heart size is normal. No pneumothorax or pleural effusion is identified.  IMPRESSION: Emphysema without acute disease.   Electronically Signed   By: Inge Rise M.D.   On: 11/14/2014 15:55     Elberta Leatherwood, MD 11/16/2014, 10:56 AM PGY-2, [place] Intern pager: [phone], text  pages welcome

## 2014-11-16 NOTE — Care Management Note (Signed)
Case Management Note  Patient Details  Name: Bruce Franklin MRN: [id number] Date of Birth: Aug 20, 1945  Subjective/Objective:  69 yo M with back abscess - s/p I&D. T2DM.              Action/Plan: CM referral to assit with meds   Expected Discharge Date:   11/16/14               Expected Discharge Plan:  Home/Self Care  In-House Referral:     Discharge planning Services  CM Consult  Post Acute Care Choice:    Choice offered to:     DME Arranged:    DME Agency:     HH Arranged:    HH Agency:     Status of Service:  In process, will continue to follow  Medicare Important Message Given:   yes Date Medicare IM Given:   11/16/14 Medicare IM give by:   Frann Rider, RN, BSN Date Additional Medicare IM Given:    Additional Medicare Important Message give by:     If discussed at [place] of Stay Meetings, dates discussed:    Additional Comments: received referral to assist pt with meds - pt is not taking Humalog b/c he can't afford it. Met with pt. He stated that he received a new prescription for insulin in a pen and his insurance doesn't pay for this insulin, so his PCP changed the insulin and he paid $8. He stated that he is able to afford his new insulin prescription and he is able to administer the insulin. He reports that he has Medicare and Medicaid. He lives with is wife, son and grandson. Will continue to f/u for [facility] needs if ordered.  Norina Buzzard, RN 11/16/2014, 2:35 PM

## 2014-11-16 NOTE — Care Management Note (Deleted)
Case Management Note  Patient Details  Name: Bruce Franklin MRN: [id number] Date of Birth: 10-31-1945  Subjective/Objective:                    Action/Plan:   Expected Discharge Date:                  Expected Discharge Plan:     In-House Referral:     Discharge planning Services     Post Acute Care Choice:    Choice offered to:     DME Arranged:    DME Agency:     HH Arranged:    [place] Agency:     Status of Service:     Medicare Important Message Given:   yes Date Medicare IM Given:   11/16/14 Medicare IM give by:   Frann Rider, RN, BSN Date Additional Medicare IM Given:    Additional Medicare Important Message give by:     If discussed at [place] of Stay Meetings, dates discussed:    Additional Comments:  Norina Buzzard, RN 11/16/2014, 2:29 PM

## 2014-11-16 NOTE — Progress Notes (Signed)
Called to room by NT who says MD took off right shoulder dressing and wants RN to redress.  Megan's note from 8/5 reviewed.  Packing pulled from wound and  aprox  69ml.  of purulence drained from wound. With slight pressure at top of wound an additional aprox. 10 ml of purulent fluid drained. Wound repacked with 1/4 inch iodoform and 4x4 apl.

## 2014-11-16 NOTE — Progress Notes (Signed)
Patient states he has given insulin to himself at home, reviewed basics. He states he has pens at home.  Reviewed hypo and hyper symptoms. State he has a CBG monitor at home and does check his blood sugars.

## 2014-11-16 NOTE — Progress Notes (Signed)
Patient ID: Bruce Franklin, male   DOB: 1945/04/16, 69 y.o.   MRN: [id number] Wound packed, no real erythema mostly just chronic changes, think he can go home with follow up and dressing changes whenever medicine ready

## 2014-11-17 DIAGNOSIS — R739 Hyperglycemia, unspecified: Secondary | ICD-10-CM

## 2014-11-17 LAB — GLUCOSE, CAPILLARY
GLUCOSE-CAPILLARY: 243 mg/dL — AB (ref 65–99)
GLUCOSE-CAPILLARY: 327 mg/dL — AB (ref 65–99)

## 2014-11-17 MED ORDER — LEVOFLOXACIN 750 MG PO TABS
750.0000 mg | ORAL_TABLET | Freq: Every day | ORAL | Status: DC
  Administered 2014-11-17: 750 mg via ORAL
  Filled 2014-11-17: qty 1

## 2014-11-17 MED ORDER — LEVOFLOXACIN 750 MG PO TABS: 750.0000 mg | ORAL_TABLET | Freq: Every day | ORAL | Status: AC

## 2014-11-17 MED ORDER — HUMALOG MIX 75/25 KWIKPEN (75-25) 100 UNIT/ML ~~LOC~~ SUPN: 10.0000 [IU] | PEN_INJECTOR | Freq: Two times a day (BID) | SUBCUTANEOUS | Status: DC

## 2014-11-17 NOTE — Progress Notes (Signed)
Family Medicine Teaching Service Daily Progress Note Intern Pager: [phone]  Patient name: Bruce Franklin Medical record number: [id number] Date of birth: [birth year] Age: 69 y.o. Gender: male  Primary Care Provider: No PCP Per Patient Consultants: General surgery Code Status: FULL  Pt Overview and Major Events to Date:  08/04 - Bedside I&D by gen surg 08/05 - Transferred from stepdown 8/6 - plan to DC pending blood cx and PO abx tolerance   Chief Complaint: Back pain and weakness  ABX/Culture Vanc 8/4>>8/6 Zosyn 8/4>>8/4 Doxycycline 8/6   Blood cx 8/5>>NGTD  Urine cx 8/4>>reincubated   Assessment and Plan: Bruce Franklin is a 69 y.o. male presenting with infected sebaceous cysts. PMH is significant for Type 2 Diabetes (insulin dependent), COPD, anxiety, hx of prostatic adenocarcinoma (2007)   # Sepsis - likely due to infected sebaceous cyst: resolved.   - PO doxy to complete a 10 day course of ABX  - cultures pending  - KVO  # Infected Sebaceous cyst- about 3 cm in size, packed. Chest CT showing Large abscess. Surgery will likely require another excision to remove capsule. Wound culture - few gram positive cocci in pairs - PO Norco for pain - Encourage CBG control for wound healing - F/u with Dr. Grandville Silos in 2 weeks - Per surgery, can d/c   - Continue BID dressing changes  # Diabetes mellitus, type 2: Hgb A1c: 12.0. Noncompliant in the past 2/2 financial issues. . - Holding metformin:  restart on discharge  - Moderate SSI for now.  - CBGs meal, night and AM - A1C 12.0  - Lantus 7u QD initiated  - DM education ordered  # COPD: Noncompliance w/ inhalers. Does not know what he takes. He believes he's taking Symbicort daily.  - Symbicort daily  - continue to monitor breathing, [place] to keep O2 saturations >92% - Stress smoking cessation  # Anxiety: Per patient, takes Xanax.  UDS positive for opiates and benzos. - Holding benzos while here given age. - If truly  taking this, consider titrating down in the future given age and Beer's criteria. - CIWA protocol  # Chronic back pain: patient is s/p 2 back surgeries and a prostatecomty. Per patient taking percocet at home. - PO Norco  # Tobacco use: Smokes 1ppd since age of 45. - Nictoine Patch   FEN/GI:KVO; HH/Carb modified  Prophylaxis: Lovenox   Disposition: planning for discharge today  Subjective:  Much improved and ready to go today.   Objective: Temp:  [98 F (36.7 C)-98.4 F (36.9 C)] 98 F (36.7 C) (08/07 0600) Pulse Rate:  [73-78] 73 (08/07 0600) Resp:  [16] 16 (08/07 0600) BP: (119-131)/(59-69) 119/59 mmHg (08/07 0600) SpO2:  [93 %-96 %] 96 % (08/07 0600) Physical Exam: General: lying in bed eating breakfast in NAD Integument: Rt posterior upper thoracic lesion noted >> packed and dressed; erythematous perimeter Cardiovascular: RRR, no murmurs appreciated Respiratory: CTAB, no wheezes Neuro: A&Ox3, no focal deficits Psych: appropriate mood and affect   Laboratory:  Recent Labs Lab 11/14/14 1535 11/14/14 2355 11/15/14 0527  WBC 15.6* 11.1* 11.1*  HGB 14.9 13.2 13.2  HCT 43.2 39.2 38.6*  PLT 206 174 174    Recent Labs Lab 11/14/14 1535 11/14/14 1557 11/14/14 2355 11/15/14 0527  NA 133*  --   --  133*  K 4.3  --   --  3.8  CL 102  --   --  103  CO2 18*  --   --  21*  BUN 18  --   --  10  CREATININE 1.22 1.00 0.94 0.82  CALCIUM 8.4*  --   --  7.8*  PROT 6.2*  --  5.2*  --   BILITOT 1.3*  --  0.8  --   ALKPHOS 83  --  57  --   ALT 18  --  19  --   AST 20  --  25  --   GLUCOSE 372*  --   --  289*    Imaging/Diagnostic Tests: Ct Chest W Contrast  11/14/2014   CLINICAL DATA:  69 year old male with extreme fatigue for the past 2 days. Hypertension. Possible abscess in the right shoulder region.  EXAM: CT CHEST WITH CONTRAST  TECHNIQUE: Multidetector CT imaging of the chest was performed during intravenous contrast administration.  CONTRAST:  156mL  OMNIPAQUE IOHEXOL 300 MG/ML  SOLN  COMPARISON:  Chest CT 07/30/2013.  FINDINGS: Mediastinum/Lymph Nodes: Heart size is normal. There is no significant pericardial fluid, thickening or pericardial calcification. No pathologically enlarged mediastinal or hilar lymph nodes. Esophagus is unremarkable in appearance. No axillary lymphadenopathy.  Lungs/Pleura: Mild diffuse bronchial wall thickening with moderate centrilobular and mild paraseptal emphysema. Focal area of architectural distortion and ground-glass attenuation in the left upper lobe and to a lesser extent in the superior segment of the left lower lobe, similar to prior study 07/30/2013, presumably an area of chronic post infectious or inflammatory scarring. Mild dependent scarring also noted in the lower lobes of the lungs bilaterally. No acute consolidative airspace disease. No pleural effusions. No suspicious appearing pulmonary nodules or masses.  Upper Abdomen: Heterogeneous low attenuation throughout the hepatic parenchyma, compatible with hepatic steatosis. There is again some intrahepatic biliary ductal dilatation, which is most severe in the left lobe of the liver, similar to prior study 07/30/2013.  Musculoskeletal/Soft Tissues: 7.2 x 3.6 x 6.2 cm are fluid in gas containing lesion in the a soft tissues of the right upper back, which appears to involve subcutaneous tissues and some of the trapezius musculature, highly concerning for a large abscess. There are no aggressive appearing lytic or blastic lesions noted in the visualized portions of the skeleton.  IMPRESSION: 1. Large abscess in the right upper back involving subcutaneous fat and underlying trapezius musculature. 2. No other acute intracranial abnormalities. 3. Hepatic steatosis. 4. Persistent intrahepatic biliary ductal dilatation, similar to prior study 07/30/2013.   Electronically Signed   By: Vinnie Langton M.D.   On: 11/14/2014 18:37   Dg Chest Port 1 View  11/14/2014   CLINICAL  DATA:  Sepsis and upper back pain.  EXAM: PORTABLE CHEST - 1 VIEW  COMPARISON:  CT chest 07/30/2013. Single view of the chest 07/29/2013.  FINDINGS: The lungs are emphysematous but clear. Heart size is normal. No pneumothorax or pleural effusion is identified.  IMPRESSION: Emphysema without acute disease.   Electronically Signed   By: Inge Rise M.D.   On: 11/14/2014 15:55     Rosemarie Ax, MD 11/17/2014, 8:23 AM PGY-3, [place] Intern pager: [phone], text pages welcome

## 2014-11-17 NOTE — Discharge Instructions (Signed)
You will take 2 antibiotics when you go home: Doxycycline 100mg  twice a day to treat your shoulder for 10 days. This is the medicine you were on after taking off the antibiotics. On the day of discharge one of your blood cultures came back positive for 2 different bacteria; we suspect this may be a contaminant but will treat this as well with another antibiotic Levaquin 750mg  once a day for 10 days.  Diabetes insulin: you should take the Kwikpen 75-25 insulin twice a day. To start with you should take 10 units before breakfast, and 10 units before dinner; this means 2 injections per day. You should measure your blood sugar before each injection to make sure your sugar is not too low (Don't inject if blood sugar is below 150). You should schedule an appointment with your regular doctor ASAP to manage your insulin dose.  Dressing Change A dressing is a material placed over wounds. It keeps the wound clean, dry, and protected from further injury. This provides an environment that favors wound healing.  BEFORE YOU BEGIN  Get your supplies together. Things you may need include:  Saline solution.  Flexible gauze dressing.  Medicated cream.  Tape.  Gloves.  Abdominal dressing pads.  Gauze squares.  Plastic bags.  Take pain medicine 30 minutes before the dressing change if you need it.  Take a shower before you do the first dressing change of the day. Use plastic wrap or a plastic bag to prevent the dressing from getting wet. REMOVING YOUR OLD DRESSING   Wash your hands with soap and water. Dry your hands with a clean towel.  Put on your gloves.  Remove any tape.  Carefully remove the old dressing. If the dressing sticks, you may dampen it with warm water to loosen it, or follow your caregiver's specific directions.  Remove any gauze or packing tape that is in your wound.  Take off your gloves.  Put the gloves, tape, gauze, or any packing tape into a plastic bag. CHANGING YOUR  DRESSING  Open the supplies.  Take the cap off the saline solution.  Open the gauze package so that the gauze remains on the inside of the package.  Put on your gloves.  Clean your wound as told by your caregiver.  If you have been told to keep your wound dry, follow those instructions.  Your caregiver may tell you to do one or more of the following:  Pick up the gauze. Pour the saline solution over the gauze. Squeeze out the extra saline solution.  Put medicated cream or other medicine on your wound if you have been told to do so.  Put the solution soaked gauze only in your wound, not on the skin around it.  Pack your wound loosely or as told by your caregiver.  Put dry gauze on your wound.  Put abdominal dressing pads over the dry gauze if your wet gauze soaks through.  Tape the abdominal dressing pads in place so they will not fall off. Do not wrap the tape completely around the affected part (arm, leg, abdomen).  Wrap the dressing pads with a flexible gauze dressing to secure it in place.  Take off your gloves. Put them in the plastic bag with the old dressing. Tie the bag shut and throw it away.  Keep the dressing clean and dry until your next dressing change.  Wash your hands. SEEK MEDICAL CARE IF:  Your skin around the wound looks red.  Your wound  feels more tender or sore.  You see pus in the wound.  Your wound smells bad.  You have a fever.  Your skin around the wound has a rash that itches and burns.  You see black or yellow skin in your wound that was not there before.  You feel nauseous, throw up, and feel very tired. Document Released: 05/06/2004 Document Revised: 06/21/2011 Document Reviewed: 02/08/2011 [facility] Patient Information 2015 [place], [place]. This information is not intended to replace advice given to you by your health care provider. Make sure you discuss any questions you have with your health care provider.

## 2014-11-17 NOTE — Progress Notes (Signed)
Interim progress note  Blood cx grew e. Coli and klebsiella x 1 bottle. Will add levaquin coverage x 10 days (suspicious for contaminant growing in only 1 bottle but would be less likely with e coli).

## 2014-11-18 LAB — WOUND CULTURE
GRAM STAIN: NONE SEEN
Special Requests: NORMAL

## 2014-11-19 LAB — CULTURE, BLOOD (ROUTINE X 2)

## 2014-11-20 LAB — CULTURE, BLOOD (ROUTINE X 2)
CULTURE: NO GROWTH
Culture: NO GROWTH

## 2016-03-30 ENCOUNTER — Inpatient Hospital Stay (HOSPITAL_COMMUNITY): Payer: Medicare HMO

## 2016-03-30 ENCOUNTER — Inpatient Hospital Stay (HOSPITAL_COMMUNITY): Payer: Medicare HMO | Admitting: Anesthesiology

## 2016-03-30 ENCOUNTER — Inpatient Hospital Stay (HOSPITAL_COMMUNITY)
Admission: AD | Admit: 2016-03-30 | Discharge: 2016-04-02 | DRG: 417 | Disposition: A | Discharge status: Home / Self Care | Payer: Medicare HMO | Source: Other Acute Inpatient Hospital | Attending: Internal Medicine | Admitting: Internal Medicine

## 2016-03-30 ENCOUNTER — Encounter (HOSPITAL_COMMUNITY)
Admission: AD | Disposition: A | Discharge status: Home / Self Care | Payer: Self-pay | Source: Other Acute Inpatient Hospital | Attending: Family Medicine

## 2016-03-30 ENCOUNTER — Encounter (HOSPITAL_COMMUNITY): Payer: Self-pay | Admitting: Internal Medicine

## 2016-03-30 DIAGNOSIS — Z981 Arthrodesis status: Secondary | ICD-10-CM

## 2016-03-30 DIAGNOSIS — Z794 Long term (current) use of insulin: Secondary | ICD-10-CM

## 2016-03-30 DIAGNOSIS — J410 Simple chronic bronchitis: Secondary | ICD-10-CM | POA: Diagnosis not present

## 2016-03-30 DIAGNOSIS — K660 Peritoneal adhesions (postprocedural) (postinfection): Secondary | ICD-10-CM | POA: Diagnosis present

## 2016-03-30 DIAGNOSIS — R079 Chest pain, unspecified: Secondary | ICD-10-CM | POA: Diagnosis present

## 2016-03-30 DIAGNOSIS — Z7982 Long term (current) use of aspirin: Secondary | ICD-10-CM

## 2016-03-30 DIAGNOSIS — K8051 Calculus of bile duct without cholangitis or cholecystitis with obstruction: Secondary | ICD-10-CM | POA: Diagnosis not present

## 2016-03-30 DIAGNOSIS — K8043 Calculus of bile duct with acute cholecystitis with obstruction: Secondary | ICD-10-CM | POA: Diagnosis not present

## 2016-03-30 DIAGNOSIS — Z79891 Long term (current) use of opiate analgesic: Secondary | ICD-10-CM

## 2016-03-30 DIAGNOSIS — Z79899 Other long term (current) drug therapy: Secondary | ICD-10-CM | POA: Diagnosis not present

## 2016-03-30 DIAGNOSIS — E875 Hyperkalemia: Secondary | ICD-10-CM | POA: Diagnosis present

## 2016-03-30 DIAGNOSIS — Z8546 Personal history of malignant neoplasm of prostate: Secondary | ICD-10-CM | POA: Diagnosis not present

## 2016-03-30 DIAGNOSIS — E871 Hypo-osmolality and hyponatremia: Secondary | ICD-10-CM | POA: Diagnosis present

## 2016-03-30 DIAGNOSIS — Z886 Allergy status to analgesic agent status: Secondary | ICD-10-CM | POA: Diagnosis not present

## 2016-03-30 DIAGNOSIS — F17210 Nicotine dependence, cigarettes, uncomplicated: Secondary | ICD-10-CM | POA: Diagnosis present

## 2016-03-30 DIAGNOSIS — K8064 Calculus of gallbladder and bile duct with chronic cholecystitis without obstruction: Principal | ICD-10-CM | POA: Diagnosis present

## 2016-03-30 DIAGNOSIS — E440 Moderate protein-calorie malnutrition: Secondary | ICD-10-CM | POA: Diagnosis present

## 2016-03-30 DIAGNOSIS — F17200 Nicotine dependence, unspecified, uncomplicated: Secondary | ICD-10-CM

## 2016-03-30 DIAGNOSIS — K8050 Calculus of bile duct without cholangitis or cholecystitis without obstruction: Secondary | ICD-10-CM | POA: Diagnosis present

## 2016-03-30 DIAGNOSIS — J449 Chronic obstructive pulmonary disease, unspecified: Secondary | ICD-10-CM | POA: Diagnosis present

## 2016-03-30 DIAGNOSIS — K8020 Calculus of gallbladder without cholecystitis without obstruction: Secondary | ICD-10-CM

## 2016-03-30 DIAGNOSIS — E1151 Type 2 diabetes mellitus with diabetic peripheral angiopathy without gangrene: Secondary | ICD-10-CM | POA: Diagnosis present

## 2016-03-30 DIAGNOSIS — R740 Nonspecific elevation of levels of transaminase and lactic acid dehydrogenase [LDH]: Secondary | ICD-10-CM | POA: Diagnosis present

## 2016-03-30 DIAGNOSIS — C61 Malignant neoplasm of prostate: Secondary | ICD-10-CM | POA: Diagnosis present

## 2016-03-30 DIAGNOSIS — F419 Anxiety disorder, unspecified: Secondary | ICD-10-CM | POA: Diagnosis present

## 2016-03-30 DIAGNOSIS — R7401 Elevation of levels of liver transaminase levels: Secondary | ICD-10-CM | POA: Diagnosis present

## 2016-03-30 DIAGNOSIS — J439 Emphysema, unspecified: Secondary | ICD-10-CM | POA: Diagnosis present

## 2016-03-30 DIAGNOSIS — Z6820 Body mass index (BMI) 20.0-20.9, adult: Secondary | ICD-10-CM | POA: Diagnosis not present

## 2016-03-30 DIAGNOSIS — Z825 Family history of asthma and other chronic lower respiratory diseases: Secondary | ICD-10-CM

## 2016-03-30 DIAGNOSIS — K851 Biliary acute pancreatitis without necrosis or infection: Secondary | ICD-10-CM | POA: Diagnosis present

## 2016-03-30 DIAGNOSIS — R1084 Generalized abdominal pain: Secondary | ICD-10-CM | POA: Diagnosis not present

## 2016-03-30 DIAGNOSIS — K8071 Calculus of gallbladder and bile duct without cholecystitis with obstruction: Secondary | ICD-10-CM | POA: Diagnosis not present

## 2016-03-30 DIAGNOSIS — E119 Type 2 diabetes mellitus without complications: Secondary | ICD-10-CM

## 2016-03-30 DIAGNOSIS — R109 Unspecified abdominal pain: Secondary | ICD-10-CM

## 2016-03-30 HISTORY — DX: Calculus of gallbladder without cholecystitis without obstruction: K80.20

## 2016-03-30 HISTORY — DX: Biliary acute pancreatitis without necrosis or infection: K85.10

## 2016-03-30 HISTORY — PX: ERCP: SHX5425

## 2016-03-30 LAB — CBC WITH DIFFERENTIAL/PLATELET
BASOS PCT: 0 %
Basophils Absolute: 0 10*3/uL (ref 0.0–0.1)
EOS PCT: 0 %
Eosinophils Absolute: 0 10*3/uL (ref 0.0–0.7)
HCT: 40.5 % (ref 39.0–52.0)
Hemoglobin: 14 g/dL (ref 13.0–17.0)
LYMPHS ABS: 0.9 10*3/uL (ref 0.7–4.0)
Lymphocytes Relative: 8 %
MCH: 31 pg (ref 26.0–34.0)
MCHC: 34.6 g/dL (ref 30.0–36.0)
MCV: 89.6 fL (ref 78.0–100.0)
Monocytes Absolute: 0.6 10*3/uL (ref 0.1–1.0)
Monocytes Relative: 6 %
Neutro Abs: 10 10*3/uL — ABNORMAL HIGH (ref 1.7–7.7)
Neutrophils Relative %: 86 %
PLATELETS: 187 10*3/uL (ref 150–400)
RBC: 4.52 MIL/uL (ref 4.22–5.81)
RDW: 13.3 % (ref 11.5–15.5)
WBC: 11.6 10*3/uL — AB (ref 4.0–10.5)

## 2016-03-30 LAB — COMPREHENSIVE METABOLIC PANEL
ALT: 248 U/L — AB (ref 17–63)
AST: 196 U/L — ABNORMAL HIGH (ref 15–41)
Albumin: 3.3 g/dL — ABNORMAL LOW (ref 3.5–5.0)
Alkaline Phosphatase: 117 U/L (ref 38–126)
Anion gap: 8 (ref 5–15)
BILIRUBIN TOTAL: 3.3 mg/dL — AB (ref 0.3–1.2)
BUN: 12 mg/dL (ref 6–20)
CALCIUM: 8.6 mg/dL — AB (ref 8.9–10.3)
CHLORIDE: 102 mmol/L (ref 101–111)
CO2: 24 mmol/L (ref 22–32)
CREATININE: 0.9 mg/dL (ref 0.61–1.24)
Glucose, Bld: 186 mg/dL — ABNORMAL HIGH (ref 65–99)
Potassium: 4.1 mmol/L (ref 3.5–5.1)
Sodium: 134 mmol/L — ABNORMAL LOW (ref 135–145)
TOTAL PROTEIN: 5.9 g/dL — AB (ref 6.5–8.1)

## 2016-03-30 LAB — TROPONIN I: Troponin I: <0.03 ng/mL (ref <0.03)

## 2016-03-30 LAB — GLUCOSE, CAPILLARY
Glucose-Capillary: 158 mg/dL — ABNORMAL HIGH (ref 65–99)
Glucose-Capillary: 195 mg/dL — ABNORMAL HIGH (ref 65–99)
Glucose-Capillary: 237 mg/dL — ABNORMAL HIGH (ref 65–99)

## 2016-03-30 LAB — HEMOGLOBIN A1C
Hgb A1c MFr Bld: 11.2 % — ABNORMAL HIGH (ref 4.8–5.6)
MEAN PLASMA GLUCOSE: 275 mg/dL

## 2016-03-30 SURGERY — ERCP, WITH INTERVENTION IF INDICATED
Anesthesia: General

## 2016-03-30 MED ORDER — SODIUM CHLORIDE 0.9 % IV SOLN: 3.0000 g | Freq: Once | INTRAVENOUS | Status: DC

## 2016-03-30 MED ORDER — INDOMETHACIN 50 MG RE SUPP: 100.0000 mg | Freq: Once | RECTAL | Status: DC

## 2016-03-30 MED ORDER — SODIUM CHLORIDE 0.9 % IV SOLN: INTRAVENOUS | Status: DC

## 2016-03-30 MED ORDER — POLYVINYL ALCOHOL 1.4 % OP SOLN
1.0000 [drp] | OPHTHALMIC | Status: DC | PRN
  Filled 2016-03-30: qty 15

## 2016-03-30 MED ORDER — MORPHINE SULFATE (PF) 2 MG/ML IV SOLN
2.0000 mg | INTRAVENOUS | Status: DC | PRN
  Administered 2016-03-30 – 2016-04-01 (×13): 2 mg via INTRAVENOUS
  Filled 2016-03-30 (×13): qty 1

## 2016-03-30 MED ORDER — LIDOCAINE HCL (CARDIAC) 20 MG/ML IV SOLN
INTRAVENOUS | Status: DC | PRN
  Administered 2016-03-30: 80 mg via INTRAVENOUS

## 2016-03-30 MED ORDER — MIDAZOLAM HCL 5 MG/5ML IJ SOLN
INTRAMUSCULAR | Status: DC | PRN
  Administered 2016-03-30: 2 mg via INTRAVENOUS

## 2016-03-30 MED ORDER — IOPAMIDOL (ISOVUE-300) INJECTION 61%
INTRAVENOUS | Status: AC
  Filled 2016-03-30: qty 50

## 2016-03-30 MED ORDER — PROPOFOL 10 MG/ML IV BOLUS
INTRAVENOUS | Status: DC | PRN
Start: 2016-03-30 — End: 2016-03-30
  Administered 2016-03-30: 150 mg via INTRAVENOUS

## 2016-03-30 MED ORDER — GLUCAGON HCL RDNA (DIAGNOSTIC) 1 MG IJ SOLR
INTRAMUSCULAR | Status: AC
  Filled 2016-03-30: qty 1

## 2016-03-30 MED ORDER — ONDANSETRON HCL 4 MG PO TABS: 4.0000 mg | ORAL_TABLET | Freq: Four times a day (QID) | ORAL | Status: DC | PRN

## 2016-03-30 MED ORDER — INSULIN ASPART 100 UNIT/ML ~~LOC~~ SOLN
0.0000 [IU] | Freq: Every day | SUBCUTANEOUS | Status: DC
  Administered 2016-03-30: 2 [IU] via SUBCUTANEOUS

## 2016-03-30 MED ORDER — NICOTINE 21 MG/24HR TD PT24
21.0000 mg | MEDICATED_PATCH | Freq: Every day | TRANSDERMAL | Status: DC
  Administered 2016-03-30 – 2016-04-02 (×4): 21 mg via TRANSDERMAL
  Filled 2016-03-30 (×4): qty 1

## 2016-03-30 MED ORDER — GLUCAGON HCL RDNA (DIAGNOSTIC) 1 MG IJ SOLR
INTRAMUSCULAR | Status: DC | PRN
  Administered 2016-03-30 (×3): .5 mg via INTRAVENOUS

## 2016-03-30 MED ORDER — FENTANYL CITRATE (PF) 100 MCG/2ML IJ SOLN
INTRAMUSCULAR | Status: DC | PRN
  Administered 2016-03-30: 100 ug via INTRAVENOUS
  Administered 2016-03-30 (×6): 25 ug via INTRAVENOUS

## 2016-03-30 MED ORDER — INDOMETHACIN 50 MG RE SUPP
RECTAL | Status: DC | PRN
  Administered 2016-03-30: 100 mg via RECTAL

## 2016-03-30 MED ORDER — SODIUM CHLORIDE 0.9 % IV SOLN
INTRAVENOUS | Status: DC
  Administered 2016-03-30 – 2016-04-01 (×6): via INTRAVENOUS

## 2016-03-30 MED ORDER — ONDANSETRON HCL 4 MG/2ML IJ SOLN: 4.0000 mg | Freq: Four times a day (QID) | INTRAMUSCULAR | Status: DC | PRN

## 2016-03-30 MED ORDER — INSULIN ASPART 100 UNIT/ML ~~LOC~~ SOLN
0.0000 [IU] | Freq: Three times a day (TID) | SUBCUTANEOUS | Status: DC
  Administered 2016-03-30 – 2016-04-02 (×8): 2 [IU] via SUBCUTANEOUS

## 2016-03-30 MED ORDER — PIPERACILLIN-TAZOBACTAM 3.375 G IVPB: 3.3750 g | Freq: Four times a day (QID) | INTRAVENOUS | Status: DC

## 2016-03-30 MED ORDER — SODIUM CHLORIDE 0.9 % IV SOLN
INTRAVENOUS | Status: DC | PRN
  Administered 2016-03-30: 40 mL

## 2016-03-30 MED ORDER — SUCCINYLCHOLINE CHLORIDE 20 MG/ML IJ SOLN
INTRAMUSCULAR | Status: DC | PRN
  Administered 2016-03-30: 100 mg via INTRAVENOUS

## 2016-03-30 MED ORDER — INDOMETHACIN 50 MG RE SUPP
RECTAL | Status: AC
  Filled 2016-03-30: qty 2

## 2016-03-30 MED ORDER — PHENYLEPHRINE HCL 10 MG/ML IJ SOLN
INTRAMUSCULAR | Status: DC | PRN
  Administered 2016-03-30: 120 ug via INTRAVENOUS
  Administered 2016-03-30: 80 ug via INTRAVENOUS
  Administered 2016-03-30: 40 ug via INTRAVENOUS

## 2016-03-30 MED ORDER — PIPERACILLIN-TAZOBACTAM 3.375 G IVPB
3.3750 g | Freq: Three times a day (TID) | INTRAVENOUS | Status: DC
Start: 2016-03-30 — End: 2016-04-02
  Administered 2016-03-30 – 2016-04-02 (×10): 3.375 g via INTRAVENOUS
  Filled 2016-03-30 (×11): qty 50

## 2016-03-30 NOTE — Anesthesia Postprocedure Evaluation (Signed)
Anesthesia Post Note  Patient: Bruce Franklin  Procedure(s) Performed: Procedure(s) (LRB): ENDOSCOPIC RETROGRADE CHOLANGIOPANCREATOGRAPHY (ERCP) (N/A)  Patient location during evaluation: PACU Anesthesia Type: General Level of consciousness: sedated and patient cooperative Pain management: pain level controlled Vital Signs Assessment: post-procedure vital signs reviewed and stable Respiratory status: spontaneous breathing Cardiovascular status: stable Anesthetic complications: no       Last Vitals:  Vitals:   03/30/16 1450 03/30/16 1503  BP: 117/76 127/73  Pulse: 85 79  Resp: (!) 21 20  Temp:  36.9 C    Last Pain:  Vitals:   03/30/16 1503  TempSrc: Oral  PainSc:                  [name]

## 2016-03-30 NOTE — H&P (View-Only) (Signed)
[facility] Consult: 8:11 AM 03/30/2016  LOS: 0 days    Referring Provider: Dr Marily Memos  Primary Care Physician:  Tamsen Roers MD Primary Gastroenterologist:  unassigned    Reason for Consultation:  Choledocholithiasis.   HPI: Bruce Franklin is a 70 y.o. male.  Smoker with COPD.  Non-obese type 2 DM.  Anxiety. S/p prostatectomy for cancer. S/p shoulder and spine surgery.  S/p I&D infected sebaceous cyst.  S/P remote flexible sigmoidoscopy versus colonoscopy; he says this was done in the 1970s which would've been when he was in his 41s. Also has remote history in 1976 of being diagnosed with ulcer based on symptoms and possibly on radiographic studies. He was treated with Tagamet and symptoms resolved. Patient has never had any bleeding ulcers or required transfusion   Patient describes a few years of intermittent upper abdominal pain, primarily in the left upper quadrant. These can be associated with green bilious emesis. He is vague as to how long these episodes last for how frequently they occur. He has never sought medical attention for the symptoms. At home he's been taking throat green liquid, sounds suspiciously like Advil/ibuprofen.  However ibuprofen is listed as having caused anaphylaxis in the past so not clear that support the "green" gelcaps were.. Also eating multiple times daily. For some undetermined amount of time, sounds like a few weeks, he's been dealing with a bout of the symptoms. In the last 2-3 days he's become increasingly weak and yesterday he was so weak he could not stand on his own and he was confused. His family brought him to [facility] emergency department. He was ultimately transferred to [place] for specialty care.  Labs at [facility] show normal renal function.  Lactic acid elevated at 2.4. Total bilirubin 2.8. Alkaline phosphatase 170. AST/ALT 364/354. Lipase elevated at 1454. Troponin I not elevated. Sodium low at 127, potassium 5.8. PT/INR/PTT normal. Hemoglobin 15.7. WBCs 18. CT chest, abdomen, pelvis with contrast showed emphysema. Intrahepatic bile ducts, especially in the left lobe, were dilated. Multiple gallstones. Numerous stones within the common hepatic and common bile duct measuring up to 1.7 cm pancreas was mildly atrophic but there was no pancreatitis.  Lifestyle wise the patient does not consume alcohol and has not done so for decades. He smokes one pack of cigarettes per day. He has a chronic cough which produces thick, nonpurulent mucus.  Patient was last seen at Dr. Eddie Dibbles office about a month ago. At that time he wasn't having GI issues and did not mention these episodes of pain and vomiting. He had not been taking any regular meds and Dr. Rex Kras prescribed metformin. Patient doesn't check blood sugars at home.  Patient's weight has been stable at 166 pounds for at least one year.  Between bouts of pain and vomiting, his appetite is good. He has regular, daily, brown BMs. Patient has not seen his urine turned dark.    Past Medical History:  Diagnosis Date  . Anxiety   . COPD (chronic obstructive pulmonary disease)   . Diabetes mellitus without  complication   . Pneumonia   . Prostate cancer   . Smokes 1 pack of cigarettes per day     Past Surgical History:  Procedure Laterality Date  . LUMBAR FUSION    . PROSTATE SURGERY    . ROTATOR CUFF REPAIR      Prior to Admission medications   Medication Sig Start Date End Date Taking? Authorizing Provider  aspirin EC 81 MG tablet Take 81 mg by mouth daily.    Historical Provider, MD  doxycycline (VIBRAMYCIN) 100 MG capsule Take 1 capsule (100 mg total) by mouth 2 (two) times daily. 11/16/14   Elberta Leatherwood, MD  HUMALOG MIX 75/25 KWIKPEN (75-25) 100 UNIT/ML Kwikpen Inject 10 Units into  the skin 2 (two) times daily. Before breakfast and before dinner 11/17/14   Leone Brand, MD  metFORMIN (GLUCOPHAGE) 500 MG tablet Take 2 tablets (1,000 mg total) by mouth 2 (two) times daily. 11/16/14   Elberta Leatherwood, MD  oxyCODONE-acetaminophen (PERCOCET) 10-325 MG per tablet Take 1 tablet by mouth 3 (three) times daily. 11/14/14   Historical Provider, MD    Scheduled Meds: . insulin aspart  0-5 Units Subcutaneous QHS  . insulin aspart  0-9 Units Subcutaneous TID WC   Infusions: . sodium chloride 100 mL/hr at 03/30/16 0618   PRN Meds: morphine injection, ondansetron **OR** ondansetron (ZOFRAN) IV   Allergies as of 03/30/2016 - Review Complete 11/14/2014  Allergen Reaction Noted  . Ibuprofen Anaphylaxis 11/14/2014    No family history on file.  Social History   Social History  . Marital status: Married    Spouse name: N/A  . Number of children: N/A  . Years of education: N/A   Occupational History  . Not on file.   Social History Main Topics  . Smoking status: Current Every Day Smoker    Packs/day: 1.00    Years: 52.00    Types: Cigarettes  . Smokeless tobacco: Not on file  . Alcohol use No  . Drug use: No  . Sexual activity: Not on file   Other Topics Concern  . Not on file   Social History Narrative  . No narrative on file    REVIEW OF SYSTEMS: Constitutional:  Malaise, weakness. Feels awful. ENT:  No nose bleeds Pulm:  Chronic cough of tenacious, nonpurulent sputum. CV:  No palpitations, no LE edema. No chest pain GU:  Patient has urinary leakage following his prostatectomy GI:  Per HPI Heme:  No unusual bleeding or bruising.   Transfusions:  Has never required transfusion. Neuro:  Currently complaining of headache. Within the last few weeks he had lost his balance and taken a spill but did not suffer any trauma. Derm:  No itching, no rash or sores. Has not noticed any jaundice. Endocrine:  No sweats or chills.  No polyuria or dysuria Immunization:  Did  not inquire as to current or past vaccinations. Travel:  None beyond local counties in last few months.    PHYSICAL EXAM: Vital signs in last 24 hours: Vitals:   03/30/16 0500  BP: 118/76  Pulse: 85  Resp: 16  Temp: 98.2 F (36.8 C)   Wt Readings from Last 3 Encounters:  03/30/16 74.5 kg (164 lb 3.9 oz)  11/14/14 75.7 kg (166 lb 14.2 oz)    General: Alert, uncomfortable, ill appearing Head:  No facial asymmetry or swelling  Eyes:  No obvious icterus. Ears:  Not HOH  Nose:  No congestion or discharge Mouth:  Moist,  clear oral mucosa. Neck:  No JVD, no thyromegaly, no masses. Lungs:  Diminished breath sounds overall. Vigorous, paroxysmal cough productive of thick, non-purulent mucus Heart: RRR. No MRG. S1, S2 present Abdomen:  Bowel sounds present, somewhat hypoactive. No tingling or tympanitic bowel sounds. Not distended. Tenderness in the upper abdomen but especially in the left upper quadrant. Not tender in the lower abdomen. No HSM or masses..   Rectal: Deferred   Musc/Skeltl: No obvious joint deformities, swelling, redness. Extremities:  No CCE.  Neurologic:  Patient is alert. He is oriented 3. Although he can describe his medical issues, he is vague as to the details. Moves all 4 limbs. No tremor, no limb weakness. Skin:  Skin tanned. No large hematomas, some purpura on the forearms. Nodes:  No cervical or inguinal adenopathy.   Psych:  Calm, pleasant, cooperative.  Intake/Output from previous day: No intake/output data recorded. Intake/Output this shift: No intake/output data recorded.  LAB RESULTS: No results for input(s): WBC, HGB, HCT, PLT in the last 72 hours. BMET Lab Results  Component Value Date   NA 133 (L) 11/15/2014   NA 133 (L) 11/14/2014   NA 136 05/19/2009   K 3.8 11/15/2014   K 4.3 11/14/2014   K 4.1 05/19/2009   CL 103 11/15/2014   CL 102 11/14/2014   CL 105 05/19/2009   CO2 21 (L) 11/15/2014   CO2 18 (L) 11/14/2014   CO2 24 05/19/2009    GLUCOSE 289 (H) 11/15/2014   GLUCOSE 372 (H) 11/14/2014   GLUCOSE 112 (H) 05/19/2009   BUN 10 11/15/2014   BUN 18 11/14/2014   BUN 21 05/19/2009   CREATININE 0.82 11/15/2014   CREATININE 0.94 11/14/2014   CREATININE 1.00 11/14/2014   CALCIUM 7.8 (L) 11/15/2014   CALCIUM 8.4 (L) 11/14/2014   CALCIUM 8.8 05/19/2009   LFT No results for input(s): PROT, ALBUMIN, AST, ALT, ALKPHOS, BILITOT, BILIDIR, IBILI in the last 72 hours. PT/INR Lab Results  Component Value Date   INR 1.26 05/15/2009   INR 1.35 05/14/2009   INR 1.60 (H) 05/13/2009   Hepatitis Panel No results for input(s): HEPBSAG, HCVAB, HEPAIGM, HEPBIGM in the last 72 hours. C-Diff No components found for: CDIFF Lipase  No results found for: LIPASE  Drugs of Abuse     Component Value Date/Time   LABOPIA POSITIVE (A) 11/14/2014 2245   COCAINSCRNUR NONE DETECTED 11/14/2014 2245   LABBENZ POSITIVE (A) 11/14/2014 2245   AMPHETMU NONE DETECTED 11/14/2014 2245   THCU NONE DETECTED 11/14/2014 2245   LABBARB NONE DETECTED 11/14/2014 2245     RADIOLOGY STUDIES: See HPI for description of CT scan performed at outside hospital    IMPRESSION:   *  Biliary pancreatitis. Lipase elevated, CT scan does not show changes of pancreatitis.  *  Choledocholithiasis.  *  DM 2.  Previously on insulin but more recently restarted on metformin alone.  *  Emphysema. Still smokes 1 pack per day.  *  Hyperkalemia at outside hospital, resolved.  *  Hyponatremia improved compared with outside hospital levels.    PLAN:     *  Place orders for ERCP today. Give antibiotic prior to procedure.  Although it sounds like he was taking ibuprofen at home, this is listed as having caused anaphylaxis in the past so did not order post operative indomethacin.  Addendum 9:56 AM. With the help of Jenn, Pharm D we have discovered that the patient had a rash from ibuprofen in the past. She feels  it would be fine to give Indocin post ERCP.  Therefore I went ahead and ordered this.   Azucena Freed  03/30/2016, 8:11 AM Pager: [phone]  ________________________________________________________________________  Velora Heckler GI MD note:  I personally examined the patient, reviewed the data and agree with the assessment and plan described above.  CBD with multiple stones, he will need ERCP (on for today at noon) and general surgery consultation to consider cholecystectomy.     Owens Loffler, MD [facility] Pager [phone]

## 2016-03-30 NOTE — Interval H&P Note (Signed)
History and Physical Interval Note:  03/30/2016 11:51 AM  Bruce Franklin  has presented today for surgery, with the diagnosis of Choledocholithiasis, biliary pancreatitis. Gallbladder stones.  The various methods of treatment have been discussed with the patient and family. After consideration of risks, benefits and other options for treatment, the patient has consented to  Procedure(s): ENDOSCOPIC RETROGRADE CHOLANGIOPANCREATOGRAPHY (ERCP) (N/A) as a surgical intervention .  The patient's history has been reviewed, patient examined, no change in status, stable for surgery.  I have reviewed the patient's chart and labs.  Questions were answered to the patient's satisfaction.     Milus Banister

## 2016-03-30 NOTE — Anesthesia Postprocedure Evaluation (Addendum)
Anesthesia Post Note  Patient: Bruce Franklin  Procedure(s) Performed: Procedure(s) (LRB): ENDOSCOPIC RETROGRADE CHOLANGIOPANCREATOGRAPHY (ERCP) (N/A)  Patient location during evaluation: PACU Anesthesia Type: General Level of consciousness: sedated and patient cooperative Pain management: pain level controlled Vital Signs Assessment: post-procedure vital signs reviewed and stable Respiratory status: spontaneous breathing Cardiovascular status: stable Anesthetic complications: no       Last Vitals:  Vitals:   03/30/16 1450 03/30/16 1503  BP: 117/76 127/73  Pulse: 85 79  Resp: (!) 21 20  Temp:  36.9 C    Last Pain:  Vitals:   03/30/16 1503  TempSrc: Oral  PainSc:                  [name]

## 2016-03-30 NOTE — Progress Notes (Signed)
Pt complaining of RUQ abdominal pain with a rate of 9/10. Md made aware. Orders has been put in and given. Pt has had immediate relief. Will continue to monitor.

## 2016-03-30 NOTE — Transfer of Care (Signed)
Immediate Anesthesia Transfer of Care Note  Patient: Bruce Franklin  Procedure(s) Performed: Procedure(s): ENDOSCOPIC RETROGRADE CHOLANGIOPANCREATOGRAPHY (ERCP) (N/A)  Patient Location: Endoscopy Unit  Anesthesia Type:General  Level of Consciousness: awake, alert  and oriented  Airway & Oxygen Therapy: Patient Spontanous Breathing and Patient connected to nasal cannula oxygen  Post-op Assessment: Report given to RN, Post -op Vital signs reviewed and stable and Patient moving all extremities X 4  Post vital signs: Reviewed and stable  Last Vitals:  Vitals:   03/30/16 1123 03/30/16 1422  BP: 124/63 (!) 144/81  Pulse: 85 95  Resp: 17 20  Temp: 36.9 C 36.9 C    Last Pain:  Vitals:   03/30/16 1422  TempSrc: Oral  PainSc: 0-No pain      Patients Stated Pain Goal: 3 ([id number])  Complications: No apparent anesthesia complications

## 2016-03-30 NOTE — H&P (Signed)
History and Physical    Bruce Franklin [id number] DOB: [birth year] DOA: 03/30/2016  PCP: No PCP Per Patient Patient coming from: home   Chief Complaint: abdominal pain/nausea and chest pain  HPI: Bruce Franklin is a 70 y.o. male with medical history significant for PVD, tobacco use, diabetes, prostate cancer status post prostatectomy, anxiety is admitted to [address] room 20 from [place] chief complaint of abdominal pain and "hurting all over. Initial evaluation in [place] reveals cholelithiasis,choledocholithiasis, hyponatremia, elevated transaminases. Transferred to [facility] for GI consult  Information is obtained from the patient. He reports two-week history of intermittent abdominal pain. Last 2 days this pain worsened. Scribes the pain is sharp intermittent located on the right side worse with eating and palpation.. Associated symptoms include nausea without emesis. He denies abdominal distention diarrhea constipation. He has complain of chest pain. Describes the pain as a "heaviness. Located left anterior nonradiating. He denies fever chills cough headache dizziness syncope or near-syncope. He denies any dysuria hematuria frequency or urgency.  ED Course: Upon arrival to the room he's afebrile hemodynamically stable and not hypoxic  Review of Systems: As per HPI otherwise 10 point review of systems negative.   Ambulatory Status: He relates independently no recent falls  Past Medical History:  Diagnosis Date  . Anxiety   . COPD (chronic obstructive pulmonary disease) ([place])   . Diabetes mellitus without complication ([place])   . Pneumonia   . Prostate cancer ([place])   . Smokes 1 pack of cigarettes per day     Past Surgical History:  Procedure Laterality Date  . LUMBAR FUSION    . PROSTATE SURGERY    . ROTATOR CUFF REPAIR      Social History   Social History  . Marital status: Married    Spouse name: N/A  . Number of children: N/A  . Years of education: N/A   Occupational  History  . Not on file.   Social History Main Topics  . Smoking status: Current Every Day Smoker    Packs/day: 1.00    Years: 52.00    Types: Cigarettes  . Smokeless tobacco: Not on file  . Alcohol use No  . Drug use: No  . Sexual activity: Not on file   Other Topics Concern  . Not on file   Social History Narrative  . 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 Ibuprofen Rash    Had no trouble breathing    Family History  Problem Relation Age of Onset  . Heart attack Mother   . COPD Father     Prior to Admission medications   Medication Sig Start Date End Date Taking? Authorizing Provider  aspirin EC 81 MG tablet Take 81 mg by mouth daily.    Historical Provider, MD  doxycycline (VIBRAMYCIN) 100 MG capsule Take 1 capsule (100 mg total) by mouth 2 (two) times daily. 11/16/14   Elberta Leatherwood, MD  HUMALOG MIX 75/25 KWIKPEN (75-25) 100 UNIT/ML Kwikpen Inject 10 Units into the skin 2 (two) times daily. Before breakfast and before dinner 11/17/14   Leone Brand, MD  metFORMIN (GLUCOPHAGE) 500 MG tablet Take 2 tablets (1,000 mg total) by mouth 2 (two) times daily. 11/16/14   Elberta Leatherwood, MD  oxyCODONE-acetaminophen (PERCOCET) 10-325 MG per tablet Take 1 tablet by mouth 3 (three) times  daily. 11/14/14   Historical Provider, MD    Physical Exam: Vitals:   03/30/16 0500  BP: 118/76  Pulse: 85  Resp: 16  Temp: 98.2 F (36.8 C)  TempSrc: Oral  SpO2: 95%  Weight: 74.5 kg (164 lb 3.9 oz)  Height: 6\' 3"  (1.905 m)     General:  Appears calm and comfortable No acute distress Eyes:  PERRL, EOMI, normal lids, iris ENT:  grossly normal hearing, lips & tongue, mucous membranes of his mouth are pink slightly dry very poor dentition Neck:  no LAD, masses or thyromegaly Cardiovascular:  RRR, no m/r/g. No LE edema. Pulses present and  palpable Respiratory:  CTA bilaterally somewhat distant, no w/r/r. Normal respiratory effort. Abdomen:  soft, nondistended tenderness particularly on the right palpation no guarding or rebounding sluggish bowel sounds Skin:  no rash or induration seen on limited exam Musculoskeletal:  grossly normal tone BUE/BLE, good ROM, no bony abnormality Psychiatric:  grossly normal mood and affect, speech fluent and appropriate, AOx3 Neurologic:  CN 2-12 grossly intact, moves all extremities in coordinated fashion, sensation intact alert and oriented 3 speech clear  Labs on Admission: I have personally reviewed following labs and imaging studies  CBC:  Recent Labs Lab 03/30/16 0739  WBC 11.6*  NEUTROABS 10.0*  HGB 14.0  HCT 40.5  MCV 89.6  PLT 123XX123   Basic Metabolic Panel:  Recent Labs Lab 03/30/16 0739  NA 134*  K 4.1  CL 102  CO2 24  GLUCOSE 186*  BUN 12  CREATININE 0.90  CALCIUM 8.6*   GFR: Estimated Creatinine Clearance: 80.5 mL/min (by C-G formula based on SCr of 0.9 mg/dL). Liver Function Tests:  Recent Labs Lab 03/30/16 0739  AST 196*  ALT 248*  ALKPHOS 117  BILITOT 3.3*  PROT 5.9*  ALBUMIN 3.3*   No results for input(s): LIPASE, AMYLASE in the last 168 hours. No results for input(s): AMMONIA in the last 168 hours. Coagulation Profile: No results for input(s): INR, PROTIME in the last 168 hours. Cardiac Enzymes:  Recent Labs Lab 03/30/16 0919  TROPONINI <0.03   BNP (last 3 results) No results for input(s): PROBNP in the last 8760 hours. HbA1C: No results for input(s): HGBA1C in the last 72 hours. CBG: No results for input(s): GLUCAP in the last 168 hours. Lipid Profile: No results for input(s): CHOL, HDL, LDLCALC, TRIG, CHOLHDL, LDLDIRECT in the last 72 hours. Thyroid Function Tests: No results for input(s): TSH, T4TOTAL, FREET4, T3FREE, THYROIDAB in the last 72 hours. Anemia Panel: No results for input(s): VITAMINB12, FOLATE, FERRITIN, TIBC, IRON,  RETICCTPCT in the last 72 hours. Urine analysis:    Component Value Date/Time   COLORURINE YELLOW 11/14/2014 [place] 11/14/2014 1851   LABSPEC 1.037 (H) 11/14/2014 1851   PHURINE 5.0 11/14/2014 1851   GLUCOSEU >1000 (A) 11/14/2014 1851   HGBUR SMALL (A) 11/14/2014 1851   BILIRUBINUR NEGATIVE 11/14/2014 1851   KETONESUR 40 (A) 11/14/2014 1851   PROTEINUR 30 (A) 11/14/2014 1851   UROBILINOGEN 0.2 11/14/2014 1851   NITRITE NEGATIVE 11/14/2014 1851   LEUKOCYTESUR NEGATIVE 11/14/2014 1851    Creatinine Clearance: Estimated Creatinine Clearance: 80.5 mL/min (by C-G formula based on SCr of 0.9 mg/dL).  Sepsis Labs: @LABRCNTIP (procalcitonin:4,lacticidven:4) )No results found for this or any previous visit (from the past 240 hour(s)).   Radiological Exams on Admission: No results found.  EKG: Independently reviewed. From [place] sinus tach  Assessment/Plan Principal Problem:   Cholelithiasis Active Problems:   TOBACCO DEPENDENCE  Malnutrition of moderate degree (HCC)   Elevated transaminase level   COPD (chronic obstructive pulmonary disease) (HCC)   Diabetes mellitus without complication (HCC)   Prostate cancer (HCC)   Hyponatremia   Hyperkalemia   Chest pain   Acute biliary pancreatitis   #1. Cholelithiasis with choledocholithiasis per imaging from [place], reticulocyte acid greater than 1400, lactic acid 2.4, AST 364, ALT 354, alkaline phosphatase 170 total bilirubin 2.8. Chart review indicates GI notified of patient's transfer. -Admit -Nothing by mouth -Vigorous IV fluids -Morphine for pain -HIDA scan -Await GI recommendations  #2. Biliary pancreatitis. Lipase greater than 1400 is noted above. Evaluated by gastroenterology who recommended ERCP -ERCP scheduled -Gen. surgery consult  2. Chest pain. Soft on admission. Heart score 4. Initial troponin negative. ekg without signs of ishcemia. -Cycle troponins -Serial EKG  #3.  Hyponatremia/hyperkalemia. Sodium level 127 at [facility] potassium 5.8 likely related to #1 in the setting of decreased oral intake and elevated serum glucose. -IV fluids -Serum osmolality -Urine osmolality -Improved controll of diabetes  #4. Diabetes. Serum glucose at [facility]. Home medications include Humalog 75/25 twice a day and metformin -We will hold this for now -Obtain a hemoglobin A1c -Sliding scale insulin for optimal control  #5. COPD. Not on home oxygen. Appears stable at baseline imaging [place] reveals emphysema changes in bases. -Monitor oxygen saturation level -Inhalers  #6. Tobacco use. -Cessation counseling offered -Nicotine patch  #7. Elevated transaminase. Related to #1.  Patient reports no EtOH consumption over 20 years.    DVT prophylaxis: scd  Code Status: full  Family Communication: none present  Disposition Plan: home when ready  Consults called: jacobs during night/early morning  Admission status: inpatient    Radene Gunning MD [facility]  If 7PM-7AM, please contact night-coverage [email or web address] Password [email or web address]  03/30/2016, 10:33 AM

## 2016-03-30 NOTE — Consult Note (Signed)
[facility] Consult: 8:11 AM 03/30/2016  LOS: 0 days    Referring Provider: Dr Marily Memos  Primary Care Physician:  Tamsen Roers MD Primary Gastroenterologist:  unassigned    Reason for Consultation:  Choledocholithiasis.   HPI: Bruce Franklin is a 70 y.o. male.  Smoker with COPD.  Non-obese type 2 DM.  Anxiety. S/p prostatectomy for cancer. S/p shoulder and spine surgery.  S/p I&D infected sebaceous cyst.  S/P remote flexible sigmoidoscopy versus colonoscopy; he says this was done in the 1970s which would've been when he was in his 68s. Also has remote history in 1976 of being diagnosed with ulcer based on symptoms and possibly on radiographic studies. He was treated with Tagamet and symptoms resolved. Patient has never had any bleeding ulcers or required transfusion   Patient describes a few years of intermittent upper abdominal pain, primarily in the left upper quadrant. These can be associated with green bilious emesis. He is vague as to how long these episodes last for how frequently they occur. He has never sought medical attention for the symptoms. At home he's been taking throat green liquid, sounds suspiciously like Advil/ibuprofen.  However ibuprofen is listed as having caused anaphylaxis in the past so not clear that support the "green" gelcaps were.. Also eating multiple times daily. For some undetermined amount of time, sounds like a few weeks, he's been dealing with a bout of the symptoms. In the last 2-3 days he's become increasingly weak and yesterday he was so weak he could not stand on his own and he was confused. His family brought him to [facility] emergency department. He was ultimately transferred to [place] for specialty care.  Labs at [facility] show normal renal function.  Lactic acid elevated at 2.4. Total bilirubin 2.8. Alkaline phosphatase 170. AST/ALT 364/354. Lipase elevated at 1454. Troponin I not elevated. Sodium low at 127, potassium 5.8. PT/INR/PTT normal. Hemoglobin 15.7. WBCs 18. CT chest, abdomen, pelvis with contrast showed emphysema. Intrahepatic bile ducts, especially in the left lobe, were dilated. Multiple gallstones. Numerous stones within the common hepatic and common bile duct measuring up to 1.7 cm pancreas was mildly atrophic but there was no pancreatitis.  Lifestyle wise the patient does not consume alcohol and has not done so for decades. He smokes one pack of cigarettes per day. He has a chronic cough which produces thick, nonpurulent mucus.  Patient was last seen at Dr. Eddie Dibbles office about a month ago. At that time he wasn't having GI issues and did not mention these episodes of pain and vomiting. He had not been taking any regular meds and Dr. Rex Kras prescribed metformin. Patient doesn't check blood sugars at home.  Patient's weight has been stable at 166 pounds for at least one year.  Between bouts of pain and vomiting, his appetite is good. He has regular, daily, brown BMs. Patient has not seen his urine turned dark.    Past Medical History:  Diagnosis Date  . Anxiety   . COPD (chronic obstructive pulmonary disease)   . Diabetes mellitus without  complication   . Pneumonia   . Prostate cancer   . Smokes 1 pack of cigarettes per day     Past Surgical History:  Procedure Laterality Date  . LUMBAR FUSION    . PROSTATE SURGERY    . ROTATOR CUFF REPAIR      Prior to Admission medications   Medication Sig Start Date End Date Taking? Authorizing Provider  aspirin EC 81 MG tablet Take 81 mg by mouth daily.    Historical Provider, MD  doxycycline (VIBRAMYCIN) 100 MG capsule Take 1 capsule (100 mg total) by mouth 2 (two) times daily. 11/16/14   Elberta Leatherwood, MD  HUMALOG MIX 75/25 KWIKPEN (75-25) 100 UNIT/ML Kwikpen Inject 10 Units into  the skin 2 (two) times daily. Before breakfast and before dinner 11/17/14   Leone Brand, MD  metFORMIN (GLUCOPHAGE) 500 MG tablet Take 2 tablets (1,000 mg total) by mouth 2 (two) times daily. 11/16/14   Elberta Leatherwood, MD  oxyCODONE-acetaminophen (PERCOCET) 10-325 MG per tablet Take 1 tablet by mouth 3 (three) times daily. 11/14/14   Historical Provider, MD    Scheduled Meds: . insulin aspart  0-5 Units Subcutaneous QHS  . insulin aspart  0-9 Units Subcutaneous TID WC   Infusions: . sodium chloride 100 mL/hr at 03/30/16 0618   PRN Meds: morphine injection, ondansetron **OR** ondansetron (ZOFRAN) IV   Allergies as of 03/30/2016 - Review Complete 11/14/2014  Allergen Reaction Noted  . Ibuprofen Anaphylaxis 11/14/2014    No family history on file.  Social History   Social History  . Marital status: Married    Spouse name: N/A  . Number of children: N/A  . Years of education: N/A   Occupational History  . Not on file.   Social History Main Topics  . Smoking status: Current Every Day Smoker    Packs/day: 1.00    Years: 52.00    Types: Cigarettes  . Smokeless tobacco: Not on file  . Alcohol use No  . Drug use: No  . Sexual activity: Not on file   Other Topics Concern  . Not on file   Social History Narrative  . No narrative on file    REVIEW OF SYSTEMS: Constitutional:  Malaise, weakness. Feels awful. ENT:  No nose bleeds Pulm:  Chronic cough of tenacious, nonpurulent sputum. CV:  No palpitations, no LE edema. No chest pain GU:  Patient has urinary leakage following his prostatectomy GI:  Per HPI Heme:  No unusual bleeding or bruising.   Transfusions:  Has never required transfusion. Neuro:  Currently complaining of headache. Within the last few weeks he had lost his balance and taken a spill but did not suffer any trauma. Derm:  No itching, no rash or sores. Has not noticed any jaundice. Endocrine:  No sweats or chills.  No polyuria or dysuria Immunization:  Did  not inquire as to current or past vaccinations. Travel:  None beyond local counties in last few months.    PHYSICAL EXAM: Vital signs in last 24 hours: Vitals:   03/30/16 0500  BP: 118/76  Pulse: 85  Resp: 16  Temp: 98.2 F (36.8 C)   Wt Readings from Last 3 Encounters:  03/30/16 74.5 kg (164 lb 3.9 oz)  11/14/14 75.7 kg (166 lb 14.2 oz)    General: Alert, uncomfortable, ill appearing Head:  No facial asymmetry or swelling  Eyes:  No obvious icterus. Ears:  Not HOH  Nose:  No congestion or discharge Mouth:  Moist,  clear oral mucosa. Neck:  No JVD, no thyromegaly, no masses. Lungs:  Diminished breath sounds overall. Vigorous, paroxysmal cough productive of thick, non-purulent mucus Heart: RRR. No MRG. S1, S2 present Abdomen:  Bowel sounds present, somewhat hypoactive. No tingling or tympanitic bowel sounds. Not distended. Tenderness in the upper abdomen but especially in the left upper quadrant. Not tender in the lower abdomen. No HSM or masses..   Rectal: Deferred   Musc/Skeltl: No obvious joint deformities, swelling, redness. Extremities:  No CCE.  Neurologic:  Patient is alert. He is oriented 3. Although he can describe his medical issues, he is vague as to the details. Moves all 4 limbs. No tremor, no limb weakness. Skin:  Skin tanned. No large hematomas, some purpura on the forearms. Nodes:  No cervical or inguinal adenopathy.   Psych:  Calm, pleasant, cooperative.  Intake/Output from previous day: No intake/output data recorded. Intake/Output this shift: No intake/output data recorded.  LAB RESULTS: No results for input(s): WBC, HGB, HCT, PLT in the last 72 hours. BMET Lab Results  Component Value Date   NA 133 (L) 11/15/2014   NA 133 (L) 11/14/2014   NA 136 05/19/2009   K 3.8 11/15/2014   K 4.3 11/14/2014   K 4.1 05/19/2009   CL 103 11/15/2014   CL 102 11/14/2014   CL 105 05/19/2009   CO2 21 (L) 11/15/2014   CO2 18 (L) 11/14/2014   CO2 24 05/19/2009    GLUCOSE 289 (H) 11/15/2014   GLUCOSE 372 (H) 11/14/2014   GLUCOSE 112 (H) 05/19/2009   BUN 10 11/15/2014   BUN 18 11/14/2014   BUN 21 05/19/2009   CREATININE 0.82 11/15/2014   CREATININE 0.94 11/14/2014   CREATININE 1.00 11/14/2014   CALCIUM 7.8 (L) 11/15/2014   CALCIUM 8.4 (L) 11/14/2014   CALCIUM 8.8 05/19/2009   LFT No results for input(s): PROT, ALBUMIN, AST, ALT, ALKPHOS, BILITOT, BILIDIR, IBILI in the last 72 hours. PT/INR Lab Results  Component Value Date   INR 1.26 05/15/2009   INR 1.35 05/14/2009   INR 1.60 (H) 05/13/2009   Hepatitis Panel No results for input(s): HEPBSAG, HCVAB, HEPAIGM, HEPBIGM in the last 72 hours. C-Diff No components found for: CDIFF Lipase  No results found for: LIPASE  Drugs of Abuse     Component Value Date/Time   LABOPIA POSITIVE (A) 11/14/2014 2245   COCAINSCRNUR NONE DETECTED 11/14/2014 2245   LABBENZ POSITIVE (A) 11/14/2014 2245   AMPHETMU NONE DETECTED 11/14/2014 2245   THCU NONE DETECTED 11/14/2014 2245   LABBARB NONE DETECTED 11/14/2014 2245     RADIOLOGY STUDIES: See HPI for description of CT scan performed at outside hospital    IMPRESSION:   *  Biliary pancreatitis. Lipase elevated, CT scan does not show changes of pancreatitis.  *  Choledocholithiasis.  *  DM 2.  Previously on insulin but more recently restarted on metformin alone.  *  Emphysema. Still smokes 1 pack per day.  *  Hyperkalemia at outside hospital, resolved.  *  Hyponatremia improved compared with outside hospital levels.    PLAN:     *  Place orders for ERCP today. Give antibiotic prior to procedure.  Although it sounds like he was taking ibuprofen at home, this is listed as having caused anaphylaxis in the past so did not order post operative indomethacin.  Addendum 9:56 AM. With the help of Jenn, Pharm D we have discovered that the patient had a rash from ibuprofen in the past. She feels  it would be fine to give Indocin post ERCP.  Therefore I went ahead and ordered this.   Azucena Freed  03/30/2016, 8:11 AM Pager: [phone]  ________________________________________________________________________  Velora Heckler GI MD note:  I personally examined the patient, reviewed the data and agree with the assessment and plan described above.  CBD with multiple stones, he will need ERCP (on for today at noon) and general surgery consultation to consider cholecystectomy.     Owens Loffler, MD [facility] Pager [phone]

## 2016-03-30 NOTE — Anesthesia Preprocedure Evaluation (Signed)
Anesthesia Evaluation  Patient identified by MRN, date of birth, ID band Patient awake    Reviewed: Allergy & Precautions, NPO status , Patient's Chart, lab work & pertinent test results  Airway Mallampati: II  TM Distance: >3 FB Neck ROM: Full    Dental no notable dental hx.    Pulmonary pneumonia, resolved, COPD, Current Smoker,    Pulmonary exam normal breath sounds clear to auscultation + decreased breath sounds      Cardiovascular negative cardio ROS Normal cardiovascular exam Rhythm:Regular Rate:Normal     Neuro/Psych negative neurological ROS  negative psych ROS   GI/Hepatic negative GI ROS, Neg liver ROS,   Endo/Other  diabetes  Renal/GU Renal disease  negative genitourinary   Musculoskeletal negative musculoskeletal ROS (+)   Abdominal   Peds negative pediatric ROS (+)  Hematology negative hematology ROS (+)   Anesthesia Other Findings   Reproductive/Obstetrics negative OB ROS                             Anesthesia Physical Anesthesia Plan  ASA: III  Anesthesia Plan: General   Post-op Pain Management:    Induction: Intravenous  Airway Management Planned: Oral ETT  Additional Equipment:   Intra-op Plan:   Post-operative Plan: Extubation in OR  Informed Consent: I have reviewed the patients History and Physical, chart, labs and discussed the procedure including the risks, benefits and alternatives for the proposed anesthesia with the patient or authorized representative who has indicated his/her understanding and acceptance.   Dental advisory given  Plan Discussed with: CRNA  Anesthesia Plan Comments:         Anesthesia Quick Evaluation

## 2016-03-30 NOTE — Progress Notes (Signed)
CAROLL MCGATH is a 70 y.o. male patient admitted from [facility] via [place]. Awake, alert - oriented  X 4 - no acute distress noted.  VSS - Blood pressure 118/76, pulse 85, temperature 98.2 F (36.8 C), temperature source Oral, resp. rate 16, height 6\' 3"  (1.905 m), weight 74.5 kg (164 lb 3.9 oz), SpO2 95 %.    IV 18g in place on left inner wrist, occlusive dsg intact without redness.   Orientation to room, and floor completed.  Admission INP armband ID verified with patient/family, and in place.   SR up x 2, fall assessment complete, with patient able to verbalize understanding of risk associated with falls, and verbalized understanding to call nsg before up out of bed.  Call light within reach, patient able to voice, and demonstrate understanding. No evidence of skin break down noted on exam.     Will cont to eval and treat per MD orders.  Vidal Schwalbe, RN 03/30/2016 6:28 AM

## 2016-03-30 NOTE — Anesthesia Procedure Notes (Signed)
Procedure Name: Intubation Date/Time: 03/30/2016 12:32 PM Performed by: Ollen Bowl Pre-anesthesia Checklist: Patient identified, Emergency Drugs available, Suction available, Patient being monitored and Timeout performed Patient Re-evaluated:Patient Re-evaluated prior to inductionOxygen Delivery Method: Circle system utilized and Simple face mask Preoxygenation: Pre-oxygenation with 100% oxygen Intubation Type: IV induction Ventilation: Mask ventilation without difficulty Laryngoscope Size: Miller and 3 Grade View: Grade I Tube type: Oral Tube size: 7.5 mm Number of attempts: 1 Airway Equipment and Method: Patient positioned with wedge pillow and Stylet Placement Confirmation: ETT inserted through vocal cords under direct vision,  positive ETCO2 and breath sounds checked- equal and bilateral Secured at: 21 cm Tube secured with: Tape Dental Injury: Teeth and Oropharynx as per pre-operative assessment

## 2016-03-30 NOTE — Op Note (Addendum)
[facility] Patient Name: Bruce Franklin Procedure Date : 03/30/2016 MRN: [id number] Attending MD: Milus Banister , MD Date of Birth: 1945/12/14 CSN: [id number] Age: 70 Admit Type: Inpatient Procedure:                ERCP Indications:              Abdominal pain of suspected biliary origin,                            elevated liver tests, CT scan showing multiple                            stones in GB and also multiple stones in dilated                            CBD, extending up to CHD. Providers:                Milus Banister, MD, Cleda Daub, RN, William Dalton, Technician Referring MD:              Medicines:                General Anesthesia, Indomethacin 123XX123 mg PR Complications:            No immediate complications. Estimated blood loss:                            None Estimated Blood Loss:     Estimated blood loss: none. Procedure:                Pre-Anesthesia Assessment:                           - Prior to the procedure, a History and Physical                            was performed, and patient medications and                            allergies were reviewed. The patient's tolerance of                            previous anesthesia was also reviewed. The risks                            and benefits of the procedure and the sedation                            options and risks were discussed with the patient.                            All questions were answered, and informed consent  was obtained. Prior Anticoagulants: The patient has                            taken no previous anticoagulant or antiplatelet                            agents. ASA Grade Assessment: III - A patient with                            severe systemic disease. After reviewing the risks                            and benefits, the patient was deemed in                            satisfactory condition to undergo the  procedure.                           After obtaining informed consent, the scope was                            passed under direct vision. Throughout the                            procedure, the patient's blood pressure, pulse, and                            oxygen saturations were monitored continuously. The                            [id number]) scope was introduced through                            the mouth, advanced to the region of the major                            papilla. A 44 autotome over a 0.035 hydrawire was                            used to cannulate the bile duct and contrast                            injected. The CBD, CHD, distal intrahepatic ducts                            were dilated (CBD 1.6cm) and there were numerous                            (10-15) mobile stones of varying size and shape.                            The largest stones appeared to be 1.5cm. A biliary  sphincterotomy was performed and then the                            sphincterotomy site was further dilated using a                            36mm biliary dilating balloon. Using multiple                            techniques (balloon retrieval, basket lithotripsy,                            flushing the duct, extending the sphincterotomy                            slightly) I removed many of the stones (black). I                            was unable to clear the duct after about 90                            minutes. There were still 5-8 stones left in place,                            some of them large but I could not engage the most                            distal stones with basket or remove them with a                            retrieval balloon. I elected to place a 7cm long                            10Fr plastic stent into the duct. The most distal                            end extended into the duodenum and the most                             proximal end seems to be above the stones. Scope In: Scope Out: Findings:      Extensive choledocholithiasis, partially treated with biliary       sphincterotomy, balloon dilation of the papilla, balloon sweeping,       mechanical lithotripsy and then eventual stenting of the CBD. Mulitple       stones remain in the CBD, many of them large. Impression:               Extensive choledocholithiasis, partially treated                            with biliary sphincterotomy, balloon dilation of  the papilla, balloon sweeping, mechanical                            lithotripsy and then eventual stenting of the CBD.                            Mulitple stones remain in the CBD, many of them                            large. Recommendation:           - Observe in hospital. If LFTs fail to improve or                            he deteriorates clinically then will consider                            inpatient transfer to tertiay biliary facility. If                            his LFTs improve and he clinically improves then                            will plan on repeat ERCP in 3-4 weeks for further                            attempts to clear the duct. Surgery could also                            consider open cholecystectomy with duct exploration. Procedure Code(s):        --- Professional ---                           [phone], Endoscopic retrograde                            cholangiopancreatography (ERCP); with placement of                            endoscopic stent into biliary or pancreatic duct,                            including pre- and post-dilation and guide wire                            passage, when performed, including sphincterotomy,                            when performed, each stent                           [id number], Endoscopic retrograde                            cholangiopancreatography (ERCP); with destruction  of calculi,  any method (eg, mechanical,                            electrohydraulic, lithotripsy) Diagnosis Code(s):        --- Professional ---                           K80.50, Calculus of bile duct without cholangitis                            or cholecystitis without obstruction                           R10.9, Unspecified abdominal pain CPT copyright 2016 [employer]. All rights reserved. The codes documented in this report are preliminary and upon coder review may  be revised to meet current compliance requirements. Milus Banister, MD 03/30/2016 2:19:06 PM This report has been signed electronically. Number of Addenda: 0

## 2016-03-31 ENCOUNTER — Ambulatory Visit: Payer: Self-pay | Admitting: Surgery

## 2016-03-31 DIAGNOSIS — R109 Unspecified abdominal pain: Secondary | ICD-10-CM

## 2016-03-31 DIAGNOSIS — R1084 Generalized abdominal pain: Secondary | ICD-10-CM

## 2016-03-31 LAB — CBC
HEMATOCRIT: 40.3 % (ref 39.0–52.0)
Hemoglobin: 13.5 g/dL (ref 13.0–17.0)
MCH: 30.7 pg (ref 26.0–34.0)
MCHC: 33.5 g/dL (ref 30.0–36.0)
MCV: 91.6 fL (ref 78.0–100.0)
Platelets: 162 10*3/uL (ref 150–400)
RBC: 4.4 MIL/uL (ref 4.22–5.81)
RDW: 13.9 % (ref 11.5–15.5)
WBC: 5.5 10*3/uL (ref 4.0–10.5)

## 2016-03-31 LAB — COMPREHENSIVE METABOLIC PANEL
ALT: 170 U/L — ABNORMAL HIGH (ref 17–63)
AST: 99 U/L — AB (ref 15–41)
Albumin: 2.9 g/dL — ABNORMAL LOW (ref 3.5–5.0)
Alkaline Phosphatase: 108 U/L (ref 38–126)
Anion gap: 4 — ABNORMAL LOW (ref 5–15)
BILIRUBIN TOTAL: 2.1 mg/dL — AB (ref 0.3–1.2)
BUN: 9 mg/dL (ref 6–20)
CHLORIDE: 105 mmol/L (ref 101–111)
CO2: 26 mmol/L (ref 22–32)
CREATININE: 0.82 mg/dL (ref 0.61–1.24)
Calcium: 8.3 mg/dL — ABNORMAL LOW (ref 8.9–10.3)
GFR calc Af Amer: >60 mL/min (ref >60)
Glucose, Bld: 156 mg/dL — ABNORMAL HIGH (ref 65–99)
Potassium: 4.3 mmol/L (ref 3.5–5.1)
Sodium: 135 mmol/L (ref 135–145)
Total Protein: 5.5 g/dL — ABNORMAL LOW (ref 6.5–8.1)

## 2016-03-31 LAB — GLUCOSE, CAPILLARY
GLUCOSE-CAPILLARY: 121 mg/dL — AB (ref 65–99)
Glucose-Capillary: 166 mg/dL — ABNORMAL HIGH (ref 65–99)
Glucose-Capillary: 192 mg/dL — ABNORMAL HIGH (ref 65–99)
Glucose-Capillary: 190 mg/dL — ABNORMAL HIGH (ref 65–99)

## 2016-03-31 LAB — LIPASE, BLOOD: LIPASE: 52 U/L — AB (ref 11–51)

## 2016-03-31 MED ORDER — OXYCODONE-ACETAMINOPHEN 5-325 MG PO TABS
1.0000 | ORAL_TABLET | ORAL | Status: DC | PRN
  Administered 2016-03-31: 1 via ORAL
  Administered 2016-03-31 – 2016-04-02 (×9): 2 via ORAL
  Filled 2016-03-31 (×7): qty 2
  Filled 2016-03-31: qty 1
  Filled 2016-03-31 (×2): qty 2

## 2016-03-31 MED ORDER — CEFAZOLIN SODIUM-DEXTROSE 2-4 GM/100ML-% IV SOLN
2.0000 g | INTRAVENOUS | Status: AC
  Administered 2016-04-01: 2 g via INTRAVENOUS
  Filled 2016-03-31 (×2): qty 100

## 2016-03-31 MED ORDER — CHLORHEXIDINE GLUCONATE CLOTH 2 % EX PADS
6.0000 | MEDICATED_PAD | Freq: Once | CUTANEOUS | Status: AC
  Administered 2016-04-01: 6 via TOPICAL

## 2016-03-31 MED ORDER — CHLORHEXIDINE GLUCONATE CLOTH 2 % EX PADS
6.0000 | MEDICATED_PAD | Freq: Once | CUTANEOUS | Status: AC
  Administered 2016-03-31: 6 via TOPICAL

## 2016-03-31 NOTE — Progress Notes (Addendum)
Daily Rounding Note  03/31/2016, 8:16 AM  LOS: 1 day   SUBJECTIVE:   Chief complaint: abdominal pain.  Persists in RUQ and radiates to back, though controlled with Morphine.  No n/v, no BMs since admission (norm is every 1 to 2 days).          OBJECTIVE:         Vital signs in last 24 hours:    Temp:  [98.4 F (36.9 C)-98.8 F (37.1 C)] 98.8 F (37.1 C) (12/20 0520) Pulse Rate:  [79-95] 87 (12/20 0520) Resp:  [14-21] 18 (12/20 0520) BP: (117-144)/(60-81) 121/60 (12/20 0520) SpO2:  [93 %-100 %] 95 % (12/20 0520) Last BM Date: 03/29/16 Filed Weights   03/30/16 0500  Weight: 74.5 kg (164 lb 3.9 oz)   General: looks better, comfortable   Heart: RRR Chest: clear bil.  Not coughing, not SOB Abdomen: soft, ND.  Tender with some voluntary guarding in RUQ.    Extremities: no CCE Neuro/Psych:  Alert, fully oriented.  Calm, no obvious weakness or deficits.  Lab Results:  Recent Labs  03/30/16 0739 03/31/16 0356  WBC 11.6* 5.5  HGB 14.0 13.5  HCT 40.5 40.3  PLT 187 162   BMET  Recent Labs  03/30/16 0739 03/31/16 0356  NA 134* 135  K 4.1 4.3  CL 102 105  CO2 24 26  GLUCOSE 186* 156*  BUN 12 9  CREATININE 0.90 0.82  CALCIUM 8.6* 8.3*   LFT  Recent Labs  03/30/16 0739 03/31/16 0356  PROT 5.9* 5.5*  ALBUMIN 3.3* 2.9*  AST 196* 99*  ALT 248* 170*  ALKPHOS 117 108  BILITOT 3.3* 2.1*   Studies/Results: Dg Ercp Biliary & Pancreatic Ducts  Result Date: 03/30/2016 CLINICAL DATA:  70 year old male with a history of ERCP. EXAM: ERCP TECHNIQUE: Multiple spot images obtained with the fluoroscopic device and submitted for interpretation post-procedure. FLUOROSCOPY TIME:  7 minutes 35 second COMPARISON:  07/30/2013, CT 03/30/2016 FINDINGS: Endoscope projects over the upper abdomen. Cannulation of the ampulla with retrograde infusion of contrast partially opacifying the extrahepatic biliary system. Multiple  filling defects within the ductal system. Final image demonstrates plastic stent in place. IMPRESSION: Limited fluoroscopic spot images during ERCP demonstrates evidence of choledocholithiasis, with placement of biliary stent. Please refer to the dictated operative report for full details of intraoperative findings and procedure. Signed, Dulcy Fanny. Earleen Newport, DO [facility] Radiology Electronically Signed   By: Corrie Mckusick D.O.   On: 03/30/2016 15:11   Scheduled Meds: . indomethacin  100 mg Rectal Once  . insulin aspart  0-5 Units Subcutaneous QHS  . insulin aspart  0-9 Units Subcutaneous TID WC  . nicotine  21 mg Transdermal Daily  . piperacillin-tazobactam (ZOSYN)  IV  3.375 g Intravenous Q8H   Continuous Infusions: . sodium chloride 125 mL/hr at 03/30/16 2221   PRN Meds:.morphine injection, ondansetron **OR** ondansetron (ZOFRAN) IV, polyvinyl alcohol   ASSESMENT:   *  Biliary pancreatitis. Lipase elevated, CT scan does not show changes of pancreatitis.  Lipase much improved.   *  Choledocholithiasis. 12/19 ERCP with sphinc and removal of extensive collection of CBD stones followed by stent placement. Multiple stones remain, many large.  LFTs greatly improved, no fever, leukocytosis resolved >24 hours, stable vital signs.  Day 3 abx.  Clinically still having pain but overall looks better c/w AM yesterday.    *  DM 2.  Previously on insulin  but more recently restarted on metformin alone.  *  Emphysema. Smokes 1 pack per day.  *  Hyponatremia, resolved.    PLAN   *  If he deteriorates clinically, consider transfer to tertiay biliary facility. If his LFTs improve (which they have) and he clinically improves then plan on repeat ERCP in 3-4 weeks for attempts to clear remaining stones from the duct. Surgery could also consider open cholecystectomy with duct exploration. Would ask Gen Surg to see for eventual cholecystectomy.  Pt  historically does not visit MD much so inpt contact safer than leaving it for him to follow up as outpt. .    *  Ambulate  *  Advance to carb mod diet.   *  Continue Zosyn.      Azucena Freed  03/31/2016, 8:16 AM Pager: [phone]  ________________________________________________________________________  Velora Heckler GI MD note:  I personally examined the patient, reviewed the data and agree with the assessment and plan described above.  His lfts have improved somewhat but he is still uncomfortable (from CBD stones +/- mild pancreatitis, probably mainly the former).  I estimate about 1/2 of the stones in his bile duct were removed, many remain and some of them are large.  I placed a bile duct stent and this can often allow clinical and LFT recovery and also soften the remaining stones so that repeat ERCP in 4-6 weeks has higher chance of complete success.  If he continues to have pain and/or LFTs fail to recover in next few days then I think the options are 1) open chole with CBD exploration or 2) transfer to tertiary facility where repeat ERCP has highest chance of success now.  If his still has significant discomfort tomorrow then should move forward with 1 or 2.  Will follow along, I am changing him to clear liquids for now as well.   Owens Loffler, MD [facility] Gastroenterology Pager [phone]

## 2016-03-31 NOTE — Progress Notes (Signed)
PROGRESS NOTE    Bruce Franklin  [id number] DOB: Nov 16, 1945 DOA: 03/30/2016 PCP: No PCP Per Patient    Brief Narrative:  70 y.o. ? PVD,  tobacco use with emphysema and still smoker DM ty ii, prostate cancer status post prostatectomy,  evaluated at [facility]   cholelithiasis,choledocholithiasis, hyponatremia, elevated transaminases.    [facility] consult   Assessment & Plan:   Principal Problem:   Bile duct stone Active Problems:   TOBACCO DEPENDENCE   Malnutrition of moderate degree (HCC)   Elevated transaminase level   COPD (chronic obstructive pulmonary disease) (HCC)   Diabetes mellitus without complication (HCC)   Prostate cancer (HCC)   Hyponatremia   Hyperkalemia   Chest pain   Acute biliary pancreatitis   #1. Cholelithiasis with choledocholithiasis per imaging from [place], reticulocyte acid greater than 1400, lactic acid 2.4, AST 364, ALT 354 Ast/alt trending ? Gen surg consulted for ? Cholecystectomy-he is not keen but might be a good idea to get it done in house, although already he is asking if he can go home Added percocet to IV morphine for pain control I have explained clearly to him its in his best interest to stay Has been grad to soft diet by GI 12/20   #2. Biliary pancreatitis with assosc leukocytosis  Lipase greater than 1400 is noted above. Evaluated by gastroenterology who recommended ERCP -ERCP done showing many stones 12/20 -Gen surg consulted -leukocysotis imporved 11-% No overt  panceatitis on CT scan  2. Chest pain.   #3. Hyponatremia/hyperkalemia. Sodium level 127 at [facility] potassium 5.8 likely related to #1 in the setting of decreased oral intake and elevated serum glucose. -I-resolved   #4. Diabetes. Serum glucose at [place]. Home medications include Humalog 75/25 twice a day and metformin -We will hold this for now -Obtain a hemoglobin A1c -Sliding scale insulin shows sugar 150-170 now  #5.  COPD. Not on home oxygen. Appears stable at baseline imaging [place] reveals emphysema changes in bases. -Monitor oxygen saturation level -Inhalers  #6. Tobacco use. -Cessation counseling offered -Nicotine patch  #7. Elevated transaminase. Related to #1.  Patient reports no EtOH consumption over 20 years.    DVT prophylaxis: Lovenox Code Status: Full Family Communication: no family + Disposition Plan: inpatient.   Consultants:   GI  Gen surg  Procedures:   ERCP  Antimicrobials:   None yet    Subjective:  In pain in abd  No cp Wants more pain meds No cravings to smoke No unilat weak No other issue  Objective: Vitals:   03/30/16 1450 03/30/16 1503 03/30/16 2118 03/31/16 0520  BP: 117/76 127/73 125/71 121/60  Pulse: 85 79 82 87  Resp: (!) 21 20 18 18   Temp:  98.5 F (36.9 C) 98.6 F (37 C) 98.8 F (37.1 C)  TempSrc:  Oral Oral Oral  SpO2: 95% 94% 98% 95%  Weight:      Height:        Intake/Output Summary (Last 24 hours) at 03/31/16 1002 Last data filed at 03/31/16 0844  Gross per 24 hour  Intake           3327.5 ml  Output             2060 ml  Net           1267.5 ml   Filed Weights   03/30/16 0500  Weight: 74.5 kg (164 lb 3.9 oz)    Examination:  General exam: Appears calm and  comfortable  Respiratory system: Clear to auscultation. Respiratory effort normal. Cardiovascular system: S1 & S2 heard, RRR. No JVD, murmurs, rubs, gallops or clicks. No pedal edema. Gastrointestinal system: Abdomen is slight tender in midlines and under RUQ Central nervous system: Alert and oriented. No focal neurological deficits. Extremities: Symmetric 5 x 5 power. Skin: No rashes, lesions or ulcers Psychiatry: Judgement and insight appear normal. Mood & affect appropriate.     Data Reviewed: I have personally reviewed following labs and imaging studies  CBC:  Recent Labs Lab 03/30/16 0739 03/31/16 0356  WBC 11.6* 5.5  NEUTROABS 10.0*  --   HGB  14.0 13.5  HCT 40.5 40.3  MCV 89.6 91.6  PLT 187 0000000   Basic Metabolic Panel:  Recent Labs Lab 03/30/16 0739 03/31/16 0356  NA 134* 135  K 4.1 4.3  CL 102 105  CO2 24 26  GLUCOSE 186* 156*  BUN 12 9  CREATININE 0.90 0.82  CALCIUM 8.6* 8.3*   GFR: Estimated Creatinine Clearance: 88.3 mL/min (by C-G formula based on SCr of 0.82 mg/dL). Liver Function Tests:  Recent Labs Lab 03/30/16 0739 03/31/16 0356  AST 196* 99*  ALT 248* 170*  ALKPHOS 117 108  BILITOT 3.3* 2.1*  PROT 5.9* 5.5*  ALBUMIN 3.3* 2.9*    Recent Labs Lab 03/31/16 0356  LIPASE 52*   No results for input(s): AMMONIA in the last 168 hours. Coagulation Profile: No results for input(s): INR, PROTIME in the last 168 hours. Cardiac Enzymes:  Recent Labs Lab 03/30/16 0919 03/30/16 1552  TROPONINI <0.03 <0.03   BNP (last 3 results) No results for input(s): PROBNP in the last 8760 hours. HbA1C:  Recent Labs  03/30/16 0639  HGBA1C 11.2*   CBG:  Recent Labs Lab 03/30/16 1130 03/30/16 1702 03/30/16 2120 03/31/16 0750  GLUCAP 158* 195* 237* 166*   Lipid Profile: No results for input(s): CHOL, HDL, LDLCALC, TRIG, CHOLHDL, LDLDIRECT in the last 72 hours. Thyroid Function Tests: No results for input(s): TSH, T4TOTAL, FREET4, T3FREE, THYROIDAB in the last 72 hours. Anemia Panel: No results for input(s): VITAMINB12, FOLATE, FERRITIN, TIBC, IRON, RETICCTPCT in the last 72 hours. Sepsis Labs: No results for input(s): PROCALCITON, LATICACIDVEN in the last 168 hours.  No results found for this or any previous visit (from the past 240 hour(s)).       Radiology Studies: Dg Ercp Biliary & Pancreatic Ducts  Result Date: 03/30/2016 CLINICAL DATA:  70 year old male with a history of ERCP. EXAM: ERCP TECHNIQUE: Multiple spot images obtained with the fluoroscopic device and submitted for interpretation post-procedure. FLUOROSCOPY TIME:  7 minutes 35 second COMPARISON:  07/30/2013, CT 03/30/2016  FINDINGS: Endoscope projects over the upper abdomen. Cannulation of the ampulla with retrograde infusion of contrast partially opacifying the extrahepatic biliary system. Multiple filling defects within the ductal system. Final image demonstrates plastic stent in place. IMPRESSION: Limited fluoroscopic spot images during ERCP demonstrates evidence of choledocholithiasis, with placement of biliary stent. Please refer to the dictated operative report for full details of intraoperative findings and procedure. Signed, Dulcy Fanny. Earleen Newport, DO [facility] Radiology Electronically Signed   By: Corrie Mckusick D.O.   On: 03/30/2016 15:11        Scheduled Meds: . indomethacin  100 mg Rectal Once  . insulin aspart  0-5 Units Subcutaneous QHS  . insulin aspart  0-9 Units Subcutaneous TID WC  . nicotine  21 mg Transdermal Daily  . piperacillin-tazobactam (ZOSYN)  IV  3.375 g Intravenous  Q8H   Continuous Infusions: . sodium chloride 100 mL/hr at 03/31/16 0936     LOS: 1 day    Time spent: [place], [place], MD [facility] Pager [phone]  If 7PM-7AM, please contact night-coverage [email or web address] Password [email or web address] 03/31/2016, 10:02 AM

## 2016-03-31 NOTE — Consult Note (Signed)
[facility] Surgery Consult/Admission Note  Bruce Franklin [birth year]  [id number].    Requesting MD: Dr. Owens Loffler, Gastroenterology Chief Complaint/Reason for Consult: Cholelithiasis   HPI:   Bruce Franklin is a 70 y.o. male with medical history significant for PVD, tobacco use, diabetes, prostate cancer status post prostatectomy, anxiety came from [address] with chief complaint of abdominal pain and "hurting all over". Initial evaluation in [place] revealed cholelithiasis,choledocholithiasis, hyponatremia, elevated transaminases. [facility] consult  Information is obtained from the patient. He states he started getting abdominal pain a few days ago. He describes it as progressively worsening, severe, more than a 10/10, in the RUQ that intermittently radiates into his back. He takes 96m percocet 4 times daily and this did not help his pain. Nothing makes it better. Associated symptoms include nausea without emesis and subjective fevers. Pt states he has been having intermittent green emesis for around a year without any abdominal pain. He denies abdominal distention, diarrhea, cough, SOB, CP, headache, dizziness, syncope, falls, dysuria, or hematuria. Pt states he was prescribed home O2 but weaned himself off of it. He smokes roughly one pack of cigarettes per day.  GI course: 12/19 ERCP with sphinc and removal of extensive collection of CBD stones followed by stent placement. Multiple stones remain, many large.  LFTs greatly improved, afebrile, leukocytosis resolved, VSS. Day 3 abx. Biliary pancreatitis. Lipase improving, CT scan does not show changes of pancreatitis.   ROS:  Review of Systems  Constitutional: Positive for fever. Negative for chills and diaphoresis.  HENT: Negative for sore throat.   Eyes: Negative for discharge.  Respiratory: Negative for cough and shortness of breath.   Cardiovascular: Negative for chest pain and leg swelling.   Gastrointestinal: Positive for abdominal pain, nausea and vomiting. Negative for blood in stool and diarrhea.  Genitourinary: Negative for dysuria and hematuria.  Musculoskeletal: Negative for falls.  Skin: Negative for itching and rash.  Neurological: Negative for dizziness, loss of consciousness and headaches.  All other systems reviewed and are negative.    Family History  Problem Relation Age of Onset  . Heart attack Mother   . COPD Father     Past Medical History:  Diagnosis Date  . Acute biliary pancreatitis   . Anxiety   . Cholelithiasis   . COPD (chronic obstructive pulmonary disease) ([place]   . Diabetes mellitus without complication ([place]   . Pneumonia   . Prostate cancer ([place]   . Smokes 1 pack of cigarettes per day     Past Surgical History:  Procedure Laterality Date  . LUMBAR FUSION    . PROSTATE SURGERY    . ROTATOR CUFF REPAIR      Social History:  reports that he has been smoking Cigarettes.  He has a 52.00 pack-year smoking history. He does not have any smokeless tobacco history on file. He reports that he does not drink alcohol or use drugs.  Allergies:  Allergies  Allergen Reactions  . Ibuprofen Rash    Had no trouble breathing    Medications Prior to Admission  Medication Sig Dispense Refill  . ALPRAZolam (XANAX) 1 MG tablet Take 1 mg by mouth 3 (three) times daily as needed for anxiety.    .Marland Kitchenaspirin EC 81 MG tablet Take 81 mg by mouth daily.    . metFORMIN (GLUCOPHAGE) 500 MG tablet Take 2 tablets (1,000 mg total) by mouth 2 (two) times daily. 120 tablet 1  . oxyCODONE-acetaminophen (PERCOCET) 10-325 MG per tablet  Take 1 tablet by mouth 3 (three) times daily.      Blood pressure 121/60, pulse 87, temperature 98.8 F (37.1 C), temperature source Oral, resp. rate 18, height _0  (1.905 m), weight 164 lb 3.9 oz (74.5 kg), SpO2 95 %.  Physical Exam: General: pleasant, WD/WN thin, elderly man who is laying in bed in NAD HEENT: head is  normocephalic, atraumatic.  Sclera are noninjected and no scleral icterus.  Oral mucosa is pink and moist Heart: regular, rate, and rhythm.  No obvious murmurs, gallops, or rubs noted.  2+ radial and PT pulses bilaterally Lungs: CTAB, no wheezes, rhonchi, or rales noted. Decreased breath sounds throughout all lung fileds. Respiratory effort nonlabored Abd: soft, ND, +BS, TTP in the RUQ, no masses or hernias appreciated, old incisional scar noted midline below umbilicus MS: all 4 extremities are symmetrical, full ROM, no edema Skin: warm and dry with no rashes noted Psych: A&Ox3 with an appropriate affect. Neuro: CM grossly 2-12 intact, normal speech  Results for orders placed or performed during the hospital encounter of 03/30/16 (from the past 48 hour(s))  Hemoglobin A1c     Status: Abnormal   Collection Time: 03/30/16  6:39 AM  Result Value Ref Range   Hgb A1c MFr Bld 11.2 (H) 4.8 - 5.6 %    Comment: (NOTE)         Pre-diabetes: 5.7 - 6.4         Diabetes: >6.4         Glycemic control for adults with diabetes: <7.0    Mean Plasma Glucose 275 mg/dL    Comment: (NOTE) Performed At: [facility] [place], [place] [id number] Lindon Romp MD HT:[id number]   Comprehensive metabolic panel     Status: Abnormal   Collection Time: 03/30/16  7:39 AM  Result Value Ref Range   Sodium 134 (L) 135 - 145 mmol/L   Potassium 4.1 3.5 - 5.1 mmol/L   Chloride 102 101 - 111 mmol/L   CO2 24 22 - 32 mmol/L   Glucose, Bld 186 (H) 65 - 99 mg/dL   BUN 12 6 - 20 mg/dL   Creatinine, Ser 0.90 0.61 - 1.24 mg/dL   Calcium 8.6 (L) 8.9 - 10.3 mg/dL   Total Protein 5.9 (L) 6.5 - 8.1 g/dL   Albumin 3.3 (L) 3.5 - 5.0 g/dL   AST 196 (H) 15 - 41 U/L   ALT 248 (H) 17 - 63 U/L   Alkaline Phosphatase 117 38 - 126 U/L   Total Bilirubin 3.3 (H) 0.3 - 1.2 mg/dL   GFR calc non Af Amer >60 >60 mL/min   GFR calc Af Amer >60 >60 mL/min    Comment: (NOTE) The eGFR has been calculated using  the CKD EPI equation. This calculation has not been validated in all clinical situations. eGFR's persistently <60 mL/min signify possible Chronic Kidney Disease.    Anion gap 8 5 - 15  CBC with Differential/Platelet     Status: Abnormal   Collection Time: 03/30/16  7:39 AM  Result Value Ref Range   WBC 11.6 (H) 4.0 - 10.5 K/uL   RBC 4.52 4.22 - 5.81 MIL/uL   Hemoglobin 14.0 13.0 - 17.0 g/dL   HCT 40.5 39.0 - 52.0 %   MCV 89.6 78.0 - 100.0 fL   MCH 31.0 26.0 - 34.0 pg   MCHC 34.6 30.0 - 36.0 g/dL   RDW 13.3 11.5 - 15.5 %   Platelets 187 150 -  400 K/uL   Neutrophils Relative % 86 %   Neutro Abs 10.0 (H) 1.7 - 7.7 K/uL   Lymphocytes Relative 8 %   Lymphs Abs 0.9 0.7 - 4.0 K/uL   Monocytes Relative 6 %   Monocytes Absolute 0.6 0.1 - 1.0 K/uL   Eosinophils Relative 0 %   Eosinophils Absolute 0.0 0.0 - 0.7 K/uL   Basophils Relative 0 %   Basophils Absolute 0.0 0.0 - 0.1 K/uL  Troponin I     Status: None   Collection Time: 03/30/16  9:19 AM  Result Value Ref Range   Troponin I <0.03 <0.03 ng/mL  Glucose, capillary     Status: Abnormal   Collection Time: 03/30/16 11:30 AM  Result Value Ref Range   Glucose-Capillary 158 (H) 65 - 99 mg/dL  Troponin I     Status: None   Collection Time: 03/30/16  3:52 PM  Result Value Ref Range   Troponin I <0.03 <0.03 ng/mL  Glucose, capillary     Status: Abnormal   Collection Time: 03/30/16  5:02 PM  Result Value Ref Range   Glucose-Capillary 195 (H) 65 - 99 mg/dL  Glucose, capillary     Status: Abnormal   Collection Time: 03/30/16  9:20 PM  Result Value Ref Range   Glucose-Capillary 237 (H) 65 - 99 mg/dL  CBC     Status: None   Collection Time: 03/31/16  3:56 AM  Result Value Ref Range   WBC 5.5 4.0 - 10.5 K/uL   RBC 4.40 4.22 - 5.81 MIL/uL   Hemoglobin 13.5 13.0 - 17.0 g/dL   HCT 40.3 39.0 - 52.0 %   MCV 91.6 78.0 - 100.0 fL   MCH 30.7 26.0 - 34.0 pg   MCHC 33.5 30.0 - 36.0 g/dL   RDW 13.9 11.5 - 15.5 %   Platelets 162 150 - 400  K/uL  Comprehensive metabolic panel     Status: Abnormal   Collection Time: 03/31/16  3:56 AM  Result Value Ref Range   Sodium 135 135 - 145 mmol/L   Potassium 4.3 3.5 - 5.1 mmol/L   Chloride 105 101 - 111 mmol/L   CO2 26 22 - 32 mmol/L   Glucose, Bld 156 (H) 65 - 99 mg/dL   BUN 9 6 - 20 mg/dL   Creatinine, Ser 0.82 0.61 - 1.24 mg/dL   Calcium 8.3 (L) 8.9 - 10.3 mg/dL   Total Protein 5.5 (L) 6.5 - 8.1 g/dL   Albumin 2.9 (L) 3.5 - 5.0 g/dL   AST 99 (H) 15 - 41 U/L   ALT 170 (H) 17 - 63 U/L   Alkaline Phosphatase 108 38 - 126 U/L   Total Bilirubin 2.1 (H) 0.3 - 1.2 mg/dL   GFR calc non Af Amer >60 >60 mL/min   GFR calc Af Amer >60 >60 mL/min    Comment: (NOTE) The eGFR has been calculated using the CKD EPI equation. This calculation has not been validated in all clinical situations. eGFR's persistently <60 mL/min signify possible Chronic Kidney Disease.    Anion gap 4 (L) 5 - 15  Lipase, blood     Status: Abnormal   Collection Time: 03/31/16  3:56 AM  Result Value Ref Range   Lipase 52 (H) 11 - 51 U/L  Glucose, capillary     Status: Abnormal   Collection Time: 03/31/16  7:50 AM  Result Value Ref Range   Glucose-Capillary 166 (H) 65 - 99 mg/dL   Dg Ercp Biliary &  Pancreatic Ducts  Result Date: 03/30/2016 CLINICAL DATA:  70 year old male with a history of ERCP. EXAM: ERCP TECHNIQUE: Multiple spot images obtained with the fluoroscopic device and submitted for interpretation post-procedure. FLUOROSCOPY TIME:  7 minutes 35 second COMPARISON:  07/30/2013, CT 03/30/2016 FINDINGS: Endoscope projects over the upper abdomen. Cannulation of the ampulla with retrograde infusion of contrast partially opacifying the extrahepatic biliary system. Multiple filling defects within the ductal system. Final image demonstrates plastic stent in place. IMPRESSION: Limited fluoroscopic spot images during ERCP demonstrates evidence of choledocholithiasis, with placement of biliary stent. Please refer to  the dictated operative report for full details of intraoperative findings and procedure. Signed, Dulcy Fanny. Earleen Newport, DO [facility] Radiology Electronically Signed   By: Corrie Mckusick D.O.   On: 03/30/2016 15:11      Assessment/Plan  Choledocholithiasis  S/P ERCP 12/19 with sphinc and removal of extensive collection of CBD stones followed by stent placement. Multiple stones remain, many large  Biliary pancreatitis: lipase improving Elevated LFT's and bilirubin: Improving Dm 2 Emphysema  FEN: clears VTE: SCD's & lovenox  Plan: Will discuss with Dr. Kae Heller to determine if this pt would benefit from transfer to [facility] or surgery here at [facility]. We will follow this patient. Thank you for the consult.   Kalman Drape, [facility] Surgery 03/31/2016, 11:04 AM Pager: [phone] Consults: [phone] Mon-Fri 7:00 am-4:30 pm Sat-Sun 7:00 am-11:30 am

## 2016-04-01 ENCOUNTER — Encounter (HOSPITAL_COMMUNITY)
Admission: AD | Disposition: A | Discharge status: Home / Self Care | Payer: Self-pay | Source: Other Acute Inpatient Hospital | Attending: Family Medicine

## 2016-04-01 ENCOUNTER — Inpatient Hospital Stay (HOSPITAL_COMMUNITY): Payer: Medicare HMO | Admitting: Anesthesiology

## 2016-04-01 HISTORY — PX: CHOLECYSTECTOMY: SHX55

## 2016-04-01 LAB — COMPREHENSIVE METABOLIC PANEL
ALBUMIN: 2.8 g/dL — AB (ref 3.5–5.0)
ALK PHOS: 103 U/L (ref 38–126)
ALT: 116 U/L — ABNORMAL HIGH (ref 17–63)
ANION GAP: 6 (ref 5–15)
AST: 45 U/L — ABNORMAL HIGH (ref 15–41)
BUN: 5 mg/dL — ABNORMAL LOW (ref 6–20)
CO2: 24 mmol/L (ref 22–32)
Calcium: 8.4 mg/dL — ABNORMAL LOW (ref 8.9–10.3)
Chloride: 107 mmol/L (ref 101–111)
Creatinine, Ser: 0.73 mg/dL (ref 0.61–1.24)
GFR calc Af Amer: >60 mL/min (ref >60)
GFR calc non Af Amer: >60 mL/min (ref >60)
GLUCOSE: 116 mg/dL — AB (ref 65–99)
Potassium: 3.8 mmol/L (ref 3.5–5.1)
SODIUM: 137 mmol/L (ref 135–145)
Total Bilirubin: 0.9 mg/dL (ref 0.3–1.2)
Total Protein: 5.3 g/dL — ABNORMAL LOW (ref 6.5–8.1)

## 2016-04-01 LAB — CBC
HEMATOCRIT: 37.9 % — AB (ref 39.0–52.0)
HEMOGLOBIN: 12.7 g/dL — AB (ref 13.0–17.0)
MCH: 30.3 pg (ref 26.0–34.0)
MCHC: 33.5 g/dL (ref 30.0–36.0)
MCV: 90.5 fL (ref 78.0–100.0)
Platelets: 143 10*3/uL — ABNORMAL LOW (ref 150–400)
RBC: 4.19 MIL/uL — ABNORMAL LOW (ref 4.22–5.81)
RDW: 13.7 % (ref 11.5–15.5)
WBC: 4.6 10*3/uL (ref 4.0–10.5)

## 2016-04-01 LAB — GLUCOSE, CAPILLARY
GLUCOSE-CAPILLARY: 131 mg/dL — AB (ref 65–99)
GLUCOSE-CAPILLARY: 150 mg/dL — AB (ref 65–99)
Glucose-Capillary: 161 mg/dL — ABNORMAL HIGH (ref 65–99)
Glucose-Capillary: 167 mg/dL — ABNORMAL HIGH (ref 65–99)
Glucose-Capillary: 156 mg/dL — ABNORMAL HIGH (ref 65–99)

## 2016-04-01 LAB — SURGICAL PCR SCREEN
MRSA, PCR: NEGATIVE
STAPHYLOCOCCUS AUREUS: NEGATIVE

## 2016-04-01 SURGERY — LAPAROSCOPIC CHOLECYSTECTOMY
Anesthesia: General | Site: Abdomen

## 2016-04-01 MED ORDER — IOPAMIDOL (ISOVUE-300) INJECTION 61%
INTRAVENOUS | Status: AC
  Filled 2016-04-01: qty 50

## 2016-04-01 MED ORDER — SODIUM CHLORIDE 0.9 % IV SOLN
INTRAVENOUS | Status: DC
  Administered 2016-04-01 – 2016-04-02 (×2): via INTRAVENOUS

## 2016-04-01 MED ORDER — LIDOCAINE 2% (20 MG/ML) 5 ML SYRINGE
INTRAMUSCULAR | Status: AC
  Filled 2016-04-01: qty 10

## 2016-04-01 MED ORDER — 0.9 % SODIUM CHLORIDE (POUR BTL) OPTIME
TOPICAL | Status: DC | PRN
  Administered 2016-04-01: 1000 mL

## 2016-04-01 MED ORDER — DOCUSATE SODIUM 100 MG PO CAPS
100.0000 mg | ORAL_CAPSULE | Freq: Two times a day (BID) | ORAL | Status: DC
  Administered 2016-04-01 – 2016-04-02 (×3): 100 mg via ORAL
  Filled 2016-04-01 (×3): qty 1

## 2016-04-01 MED ORDER — LACTATED RINGERS IV SOLN
INTRAVENOUS | Status: DC
  Administered 2016-04-01 (×2): via INTRAVENOUS

## 2016-04-01 MED ORDER — BUPIVACAINE-EPINEPHRINE (PF) 0.25% -1:200000 IJ SOLN
INTRAMUSCULAR | Status: AC
  Filled 2016-04-01: qty 30

## 2016-04-01 MED ORDER — PROPOFOL 10 MG/ML IV BOLUS
INTRAVENOUS | Status: DC | PRN
  Administered 2016-04-01: 150 mg via INTRAVENOUS

## 2016-04-01 MED ORDER — MIDAZOLAM HCL 2 MG/2ML IJ SOLN: 0.5000 mg | Freq: Once | INTRAMUSCULAR | Status: DC | PRN

## 2016-04-01 MED ORDER — PROMETHAZINE HCL 25 MG/ML IJ SOLN: 6.2500 mg | INTRAMUSCULAR | Status: DC | PRN

## 2016-04-01 MED ORDER — SUCCINYLCHOLINE CHLORIDE 200 MG/10ML IV SOSY
PREFILLED_SYRINGE | INTRAVENOUS | Status: AC
  Filled 2016-04-01: qty 10

## 2016-04-01 MED ORDER — BUPIVACAINE-EPINEPHRINE 0.25% -1:200000 IJ SOLN
INTRAMUSCULAR | Status: DC | PRN
  Administered 2016-04-01: 10 mL

## 2016-04-01 MED ORDER — HYDROMORPHONE HCL 1 MG/ML IJ SOLN
0.2500 mg | INTRAMUSCULAR | Status: DC | PRN
  Administered 2016-04-01 (×2): 0.5 mg via INTRAVENOUS

## 2016-04-01 MED ORDER — HYDROMORPHONE HCL 1 MG/ML IJ SOLN
INTRAMUSCULAR | Status: AC
  Administered 2016-04-01: 0.5 mg via INTRAVENOUS
  Filled 2016-04-01: qty 0.5

## 2016-04-01 MED ORDER — ROCURONIUM BROMIDE 100 MG/10ML IV SOLN
INTRAVENOUS | Status: DC | PRN
  Administered 2016-04-01: 30 mg via INTRAVENOUS
  Administered 2016-04-01: 40 mg via INTRAVENOUS

## 2016-04-01 MED ORDER — SUGAMMADEX SODIUM 200 MG/2ML IV SOLN
INTRAVENOUS | Status: AC
  Filled 2016-04-01: qty 2

## 2016-04-01 MED ORDER — HYDROMORPHONE HCL 2 MG/ML IJ SOLN
0.5000 mg | INTRAMUSCULAR | Status: DC | PRN
  Administered 2016-04-01 – 2016-04-02 (×4): 0.5 mg via INTRAVENOUS
  Filled 2016-04-01 (×4): qty 1

## 2016-04-01 MED ORDER — SIMETHICONE 80 MG PO CHEW
160.0000 mg | CHEWABLE_TABLET | Freq: Four times a day (QID) | ORAL | Status: AC | PRN
Start: 2016-04-01 — End: 2016-04-02
  Administered 2016-04-01 – 2016-04-02 (×2): 160 mg via ORAL
  Filled 2016-04-01 (×2): qty 2

## 2016-04-01 MED ORDER — LIDOCAINE HCL (CARDIAC) 20 MG/ML IV SOLN
INTRAVENOUS | Status: DC | PRN
  Administered 2016-04-01: 100 mg via INTRATRACHEAL

## 2016-04-01 MED ORDER — FENTANYL CITRATE (PF) 100 MCG/2ML IJ SOLN
INTRAMUSCULAR | Status: AC
  Filled 2016-04-01: qty 2

## 2016-04-01 MED ORDER — PHENYLEPHRINE 40 MCG/ML (10ML) SYRINGE FOR IV PUSH (FOR BLOOD PRESSURE SUPPORT)
PREFILLED_SYRINGE | INTRAVENOUS | Status: AC
  Filled 2016-04-01: qty 10

## 2016-04-01 MED ORDER — ROCURONIUM BROMIDE 50 MG/5ML IV SOSY
PREFILLED_SYRINGE | INTRAVENOUS | Status: AC
  Filled 2016-04-01: qty 10

## 2016-04-01 MED ORDER — ONDANSETRON HCL 4 MG/2ML IJ SOLN
INTRAMUSCULAR | Status: DC | PRN
  Administered 2016-04-01: 4 mg via INTRAVENOUS

## 2016-04-01 MED ORDER — FENTANYL CITRATE (PF) 100 MCG/2ML IJ SOLN
INTRAMUSCULAR | Status: DC | PRN
  Administered 2016-04-01: 50 ug via INTRAVENOUS
  Administered 2016-04-01: 100 ug via INTRAVENOUS
  Administered 2016-04-01: 50 ug via INTRAVENOUS
  Administered 2016-04-01: 100 ug via INTRAVENOUS

## 2016-04-01 MED ORDER — SUGAMMADEX SODIUM 500 MG/5ML IV SOLN
INTRAVENOUS | Status: AC
  Filled 2016-04-01: qty 5

## 2016-04-01 MED ORDER — SODIUM CHLORIDE 0.9 % IR SOLN
Status: DC | PRN
  Administered 2016-04-01: 1000 mL

## 2016-04-01 MED ORDER — SUGAMMADEX SODIUM 200 MG/2ML IV SOLN
INTRAVENOUS | Status: DC | PRN
  Administered 2016-04-01: 200 mg via INTRAVENOUS

## 2016-04-01 MED ORDER — MEPERIDINE HCL 25 MG/ML IJ SOLN: 6.2500 mg | INTRAMUSCULAR | Status: DC | PRN

## 2016-04-01 MED ORDER — FENTANYL CITRATE (PF) 100 MCG/2ML IJ SOLN
INTRAMUSCULAR | Status: AC
  Filled 2016-04-01: qty 4

## 2016-04-01 SURGICAL SUPPLY — 42 items
ADH SKN CLS APL DERMABOND .7 (GAUZE/BANDAGES/DRESSINGS) ×1
APPLIER CLIP 5 13 M/L LIGAMAX5 (MISCELLANEOUS)
APPLIER CLIP ROT 10 11.4 M/L (STAPLE) ×3
APR CLP MED LRG 11.4X10 (STAPLE) ×1
APR CLP MED LRG 5 ANG JAW (MISCELLANEOUS)
BAG SPEC RTRVL LRG 6X4 10 (ENDOMECHANICALS) ×1
BLADE SURG ROTATE 9660 (MISCELLANEOUS) ×2 IMPLANT
CANISTER SUCTION 2500CC (MISCELLANEOUS) ×3 IMPLANT
CHLORAPREP W/TINT 26ML (MISCELLANEOUS) ×3 IMPLANT
CLIP APPLIE 5 13 M/L LIGAMAX5 (MISCELLANEOUS) ×1 IMPLANT
CLIP APPLIE ROT 10 11.4 M/L (STAPLE) IMPLANT
COVER SURGICAL LIGHT HANDLE (MISCELLANEOUS) ×3 IMPLANT
DERMABOND ADVANCED (GAUZE/BANDAGES/DRESSINGS) ×2
DERMABOND ADVANCED .7 DNX12 (GAUZE/BANDAGES/DRESSINGS) ×1 IMPLANT
ELECT REM PT RETURN 9FT ADLT (ELECTROSURGICAL) ×3
ELECTRODE REM PT RTRN 9FT ADLT (ELECTROSURGICAL) ×1 IMPLANT
ENDOLOOP SUT PDS II  0 18 (SUTURE) ×2
ENDOLOOP SUT PDS II 0 18 (SUTURE) IMPLANT
GLOVE BIO SURGEON STRL SZ 6 (GLOVE) ×3 IMPLANT
GLOVE BIOGEL PI IND STRL 6.5 (GLOVE) ×1 IMPLANT
GLOVE BIOGEL PI INDICATOR 6.5 (GLOVE) ×2
GOWN STRL REUS W/ TWL LRG LVL3 (GOWN DISPOSABLE) ×3 IMPLANT
GOWN STRL REUS W/TWL LRG LVL3 (GOWN DISPOSABLE) ×9
GRASPER SUT TROCAR 14GX15 (MISCELLANEOUS) ×3 IMPLANT
KIT BASIN OR (CUSTOM PROCEDURE TRAY) ×3 IMPLANT
KIT ROOM TURNOVER OR (KITS) ×3 IMPLANT
NDL INSUFFLATION 14GA 120MM (NEEDLE) ×1 IMPLANT
NEEDLE INSUFFLATION 14GA 120MM (NEEDLE) IMPLANT
NS IRRIG 1000ML POUR BTL (IV SOLUTION) ×3 IMPLANT
PAD ARMBOARD 7.5X6 YLW CONV (MISCELLANEOUS) ×3 IMPLANT
POUCH SPECIMEN RETRIEVAL 10MM (ENDOMECHANICALS) ×3 IMPLANT
SCISSORS LAP 5X35 DISP (ENDOMECHANICALS) ×3 IMPLANT
SET IRRIG TUBING LAPAROSCOPIC (IRRIGATION / IRRIGATOR) ×3 IMPLANT
SLEEVE ENDOPATH XCEL 5M (ENDOMECHANICALS) ×6 IMPLANT
SPECIMEN JAR SMALL (MISCELLANEOUS) ×3 IMPLANT
SUT MNCRL AB 4-0 PS2 18 (SUTURE) ×3 IMPLANT
TOWEL OR 17X24 6PK STRL BLUE (TOWEL DISPOSABLE) ×3 IMPLANT
TOWEL OR 17X26 10 PK STRL BLUE (TOWEL DISPOSABLE) IMPLANT
TRAY LAPAROSCOPIC MC (CUSTOM PROCEDURE TRAY) ×3 IMPLANT
TROCAR XCEL 12X100 BLDLESS (ENDOMECHANICALS) ×3 IMPLANT
TROCAR XCEL NON-BLD 5MMX100MML (ENDOMECHANICALS) ×3 IMPLANT
TUBING INSUFFLATION (TUBING) ×3 IMPLANT

## 2016-04-01 NOTE — Progress Notes (Signed)
PROGRESS NOTE    Bruce Franklin  [id number] DOB: 04-29-1945 DOA: 03/30/2016 PCP: No PCP Per Patient    Brief Narrative:  70 y.o. ? PVD,  tobacco use with emphysema and still smoker DM ty ii, prostate cancer status post prostatectomy,  evaluated at [facility]   cholelithiasis,choledocholithiasis, hyponatremia, elevated transaminases.    [facility] consult. gen surg consulted   Assessment & Plan:   Principal Problem:   Bile duct stone Active Problems:   TOBACCO DEPENDENCE   Malnutrition of moderate degree (HCC)   Elevated transaminase level   COPD (chronic obstructive pulmonary disease) (HCC)   Diabetes mellitus without complication (HCC)   Prostate cancer (HCC)   Hyponatremia   Hyperkalemia   Chest pain   Acute biliary pancreatitis   Abdominal pain   #1. Cholelithiasis with choledocholithiasis  Status post cholecystectomy 12/20 Ast/alt trending ? Further since admission as is bilirubin Added percocet to IV morphine for pain control Monitor LFT trend and advancement of diet  #2. Biliary pancreatitis with assosc leukocytosis Lipase greater than 1400 is noted above. Evaluated by gastroenterology who recommended ERCP -ERCP done showing many stones 12/19 -Gen surg consulted as above -leukocytosis imporved 11-4 % -Still on clear diet and will need to be cautiously graduated and IV antibiotics transitioned if felt stable enough to take by mouth and continue therapy orally  2. Chest pain.  -resolved  #3. Hyponatremia/hyperkalemia. Sodium level 127 at [facility] potassium 5.8 likely related to #1 in the setting of decreased oral intake and elevated serum glucose. -I-resolved   #4. Diabetes. Serum glucose at [place]. Home medications include Humalog 75/25 twice a day and metformin-A1c11.2 with poor control -We will hold this for now -Sliding scale insulin shows sugar 130-160  #5. COPD. Not on home oxygen. Appears stable at baseline imaging  Oval Linsey reveals emphysema changes in bases. -Monitor oxygen saturation level -Inhalers  #6. Tobacco use. -Cessation counseling offered -Nicotine patch  #7. Elevated transaminase. Related to #1.  Patient reports no EtOH consumption over 20 years.    DVT prophylaxis: Lovenox Code Status: Full Family Communication: no family + Disposition Plan: inpatient-await resolution of post-op pains and therapy eval.  Might require SNF placement   Consultants:   GI  Gen surg  Procedures:   ERCP  Lap Chole 12/21  Antimicrobials:   None yet    Subjective:  RUQ and is improved however patient has pain over sites of incisions he complains about liquid diet but seems to understand the need for the same Overall he is insignificantly changed condition from prior   Objective: Vitals:   04/01/16 1120 04/01/16 1135 04/01/16 1150 04/01/16 1212  BP: 126/60 (!) 123/98 111/75 134/65  Pulse: 78 78 80 87  Resp: 14 17 14    Temp:   98.1 F (36.7 C) 98.8 F (37.1 C)  TempSrc:    Oral  SpO2: 95% 96% 96% 93%  Weight:      Height:        Intake/Output Summary (Last 24 hours) at 04/01/16 1330 Last data filed at 04/01/16 1100  Gross per 24 hour  Intake          4461.67 ml  Output             1355 ml  Net          3106.67 ml   Filed Weights   03/30/16 0500  Weight: 74.5 kg (164 lb 3.9 oz)    Examination:  General exam: Appears calm  and comfortable  Respiratory system: Clear to auscultation. Respiratory effort normal. Cardiovascular system: S1 & S2 heard, RRR. No JVD, murmurs, rubs, gallops or clicks. No pedal edema. Gastrointestinal system: Abdomen is slight tender Over incisions Central nervous system: Alert and oriented. No focal neurological deficits. Extremities: Symmetric 5 x 5 power. Skin: No rashes, lesions or ulcers Psychiatry: Judgement and insight appear normal. Mood & affect appropriate.     Data Reviewed: I have personally reviewed following labs and imaging  studies  CBC:  Recent Labs Lab 03/30/16 0739 03/31/16 0356 04/01/16 0410  WBC 11.6* 5.5 4.6  NEUTROABS 10.0*  --   --   HGB 14.0 13.5 12.7*  HCT 40.5 40.3 37.9*  MCV 89.6 91.6 90.5  PLT 187 162 [id number]*   Basic Metabolic Panel:  Recent Labs Lab 03/30/16 0739 03/31/16 0356 04/01/16 0410  NA 134* 135 137  K 4.1 4.3 3.8  CL 102 105 107  CO2 24 26 24   GLUCOSE 186* 156* 116*  BUN 12 9 5*  CREATININE 0.90 0.82 0.73  CALCIUM 8.6* 8.3* 8.4*   GFR: Estimated Creatinine Clearance: 90.5 mL/min (by C-G formula based on SCr of 0.73 mg/dL). Liver Function Tests:  Recent Labs Lab 03/30/16 0739 03/31/16 0356 04/01/16 0410  AST 196* 99* 45*  ALT 248* 170* 116*  ALKPHOS 117 108 103  BILITOT 3.3* 2.1* 0.9  PROT 5.9* 5.5* 5.3*  ALBUMIN 3.3* 2.9* 2.8*    Recent Labs Lab 03/31/16 0356  LIPASE 52*   No results for input(s): AMMONIA in the last 168 hours. Coagulation Profile: No results for input(s): INR, PROTIME in the last 168 hours. Cardiac Enzymes:  Recent Labs Lab 03/30/16 0919 03/30/16 1552  TROPONINI <0.03 <0.03   BNP (last 3 results) No results for input(s): PROBNP in the last 8760 hours. HbA1C:  Recent Labs  03/30/16 0639  HGBA1C 11.2*   CBG:  Recent Labs Lab 03/31/16 1716 03/31/16 2212 04/01/16 0759 04/01/16 1053 04/01/16 1216  GLUCAP 190* 121* 131* 150* 161*   Lipid Profile: No results for input(s): CHOL, HDL, LDLCALC, TRIG, CHOLHDL, LDLDIRECT in the last 72 hours. Thyroid Function Tests: No results for input(s): TSH, T4TOTAL, FREET4, T3FREE, THYROIDAB in the last 72 hours. Anemia Panel: No results for input(s): VITAMINB12, FOLATE, FERRITIN, TIBC, IRON, RETICCTPCT in the last 72 hours. Sepsis Labs: No results for input(s): PROCALCITON, LATICACIDVEN in the last 168 hours.  Recent Results (from the past 240 hour(s))  Surgical pcr screen     Status: None   Collection Time: 03/31/16  8:23 PM  Result Value Ref Range Status   MRSA, PCR  NEGATIVE NEGATIVE Final   Staphylococcus aureus NEGATIVE NEGATIVE Final    Comment:        The Xpert SA Assay (FDA approved for NASAL specimens in patients over 85 years of age), is one component of a comprehensive surveillance program.  Test performance has been validated by [facility] for patients greater than or equal to 92 year old. It is not intended to diagnose infection nor to guide or monitor treatment.          Radiology Studies: Dg Ercp Biliary & Pancreatic Ducts  Result Date: 03/30/2016 CLINICAL DATA:  70 year old male with a history of ERCP. EXAM: ERCP TECHNIQUE: Multiple spot images obtained with the fluoroscopic device and submitted for interpretation post-procedure. FLUOROSCOPY TIME:  7 minutes 35 second COMPARISON:  07/30/2013, CT 03/30/2016 FINDINGS: Endoscope projects over the upper abdomen. Cannulation of the ampulla with retrograde infusion of contrast  partially opacifying the extrahepatic biliary system. Multiple filling defects within the ductal system. Final image demonstrates plastic stent in place. IMPRESSION: Limited fluoroscopic spot images during ERCP demonstrates evidence of choledocholithiasis, with placement of biliary stent. Please refer to the dictated operative report for full details of intraoperative findings and procedure. Signed, Dulcy Fanny. Earleen Newport, DO [facility] Radiology Electronically Signed   By: Corrie Mckusick D.O.   On: 03/30/2016 15:11        Scheduled Meds: . docusate sodium  100 mg Oral BID  . insulin aspart  0-5 Units Subcutaneous QHS  . insulin aspart  0-9 Units Subcutaneous TID WC  . nicotine  21 mg Transdermal Daily  . piperacillin-tazobactam (ZOSYN)  IV  3.375 g Intravenous Q8H   Continuous Infusions: . sodium chloride       LOS: 2 days    Time spent: 74    Nita Sells, MD [facility] Pager [phone]  If 7PM-7AM, please contact  night-coverage [email or web address] Password [email or web address] 04/01/2016, 1:30 PM

## 2016-04-01 NOTE — Anesthesia Procedure Notes (Signed)
Procedure Name: Intubation Date/Time: 04/01/2016 9:13 AM Performed by: Mariea Clonts Pre-anesthesia Checklist: Patient identified, Emergency Drugs available, Suction available and Patient being monitored Patient Re-evaluated:Patient Re-evaluated prior to inductionOxygen Delivery Method: Circle System Utilized Preoxygenation: Pre-oxygenation with 100% oxygen Intubation Type: IV induction Ventilation: Mask ventilation without difficulty Laryngoscope Size: Miller and 3 Grade View: Grade I Tube type: Oral Tube size: 7.5 mm Number of attempts: 1 Airway Equipment and Method: Stylet and Oral airway Placement Confirmation: ETT inserted through vocal cords under direct vision,  positive ETCO2 and breath sounds checked- equal and bilateral Tube secured with: Tape Dental Injury: Teeth and Oropharynx as per pre-operative assessment

## 2016-04-01 NOTE — Op Note (Signed)
Operative Note  SIMON OHLINGER 70 y.o. male [id number]  04/01/2016  Surgeon: Clovis Riley   Assistant: none  Procedure performed: Laparoscopic Cholecystectomy  Preop diagnosis: gallstone pancreatitis, choledocholithiasis s/p ERCP and stent Post-op diagnosis/intraop findings: same  Specimens: gallbladder  EBL: [id number]  Complications: none  Description of procedure: After obtaining informed consent the patient was brought to the operating room. Prophylactic antibiotics and subcutaneous heparin were administered. SCD's were applied. General endotracheal anesthesia was initiated and a formal time-out was performed. The abdomen was prepped and draped in the usual sterile fashion and the abdomen was entered using visiport technique in the left upper quadrant after instilling the site with local. Insufflation to 42mmHg was obtained and gross inspection revealed no evidence of injury from our entry or other intraabdominal abnormalities. A right lateral 57mm trocar was introcuded under direct visualization and then omental adhesions to the anterior abdominal wall were sharply lysed. Two 69mm trocars were introduced in the supraumbilical and right midclavicular under direct visualization and following infiltration with local. The entry port was upsized to a 17mm trocar. The gallbladder was retracted cephalad and the infundibulum was retracted laterally. There were omental adhesions to the gallbladder which was very inflamed. A combination of hook electrocautery and blunt dissection was utilized to clear the peritoneum from the neck and cystic duct, circumferentially isolating the cystic artery and cystic duct and lifting the gallbladder from the cystic plate. The cystic artery was in a posterior position along with the lymph node. This was dissected well up onto the gallbladder to confirm our anatomy. The critical view of safety was achieved with the cystic artery, cystic duct, and liver bed visualized  between them with no other structures. The artery was clipped with a single clip proximally and distally and divided as was the cystic duct with two clips on the proximal end. The gallbladder was dissected from the liver plate using electrocautery. Once freed the gallbladder was placed in an endocatch bag and removed through the epigastric trocar site. A small amount of bleeding on the liver bed was controlled with cautery.  Hemostasis was once again confirmed, and reinspection of the abdomen revealed no injuries. The clips were well opposed without any bile leak from the duct or the liver bed. I elected to reinforce the clips with a PDS endoloop proximally. The 35mm trocar site in the epigastrium was closed with a 0 vicryl in the fascia under direct visualization using a PMI device. The abdomen was desufflated and all trocars removed. The skin incisions were closed with running subcuticular monocryl and Dermabond. The patient was awakened, extubated and transported to the recovery room in stable condition.   All counts were correct at the completion of the case.

## 2016-04-01 NOTE — Progress Notes (Signed)
Note lap chole, no IOC, this AM.  GB adhesions and inflammation noted.   preop LFTs further improved in last 24 hour  Assessment *  Biliary pancreatitis.  Choledocholithiasis.   12/19 ERCP with sphinc and removal of extensive collection of CBD stones followed by stent placement. Multiple stones remain, many large.  *  Cholecystitis, cholelithiasis.   S/p lap chole 12/21  Plan.   *  Will follow pt tomorrow.

## 2016-04-01 NOTE — H&P (View-Only) (Signed)
[facility] Surgery Consult/Admission Note  Bruce Franklin [birth year]  [id number].    Requesting MD: Dr. Owens Loffler, Gastroenterology Chief Complaint/Reason for Consult: Cholelithiasis   HPI:   Bruce Franklin is a 70 y.o. male with medical history significant for PVD, tobacco use, diabetes, prostate cancer status post prostatectomy, anxiety came from [address] with chief complaint of abdominal pain and "hurting all over". Initial evaluation in [place] revealed cholelithiasis,choledocholithiasis, hyponatremia, elevated transaminases. Transferred [facility] consult  Information is obtained from the patient. He states he started getting abdominal pain a few days ago. He describes it as progressively worsening, severe, more than a 10/10, in the RUQ that intermittently radiates into his back. He takes 96m percocet 4 times daily and this did not help his pain. Nothing makes it better. Associated symptoms include nausea without emesis and subjective fevers. Pt states he has been having intermittent green emesis for around a year without any abdominal pain. He denies abdominal distention, diarrhea, cough, SOB, CP, headache, dizziness, syncope, falls, dysuria, or hematuria. Pt states he was prescribed home O2 but weaned himself off of it. He smokes roughly one pack of cigarettes per day.  GI course: 12/19 ERCP with sphinc and removal of extensive collection of CBD stones followed by stent placement. Multiple stones remain, many large.  LFTs greatly improved, afebrile, leukocytosis resolved, VSS. Day 3 abx. Biliary pancreatitis. Lipase improving, CT scan does not show changes of pancreatitis.   ROS:  Review of Systems  Constitutional: Positive for fever. Negative for chills and diaphoresis.  HENT: Negative for sore throat.   Eyes: Negative for discharge.  Respiratory: Negative for cough and shortness of breath.   Cardiovascular: Negative for chest pain and leg swelling.   Gastrointestinal: Positive for abdominal pain, nausea and vomiting. Negative for blood in stool and diarrhea.  Genitourinary: Negative for dysuria and hematuria.  Musculoskeletal: Negative for falls.  Skin: Negative for itching and rash.  Neurological: Negative for dizziness, loss of consciousness and headaches.  All other systems reviewed and are negative.    Family History  Problem Relation Age of Onset  . Heart attack Mother   . COPD Father     Past Medical History:  Diagnosis Date  . Acute biliary pancreatitis   . Anxiety   . Cholelithiasis   . COPD (chronic obstructive pulmonary disease) ([place]   . Diabetes mellitus without complication ([place]   . Pneumonia   . Prostate cancer ([place]   . Smokes 1 pack of cigarettes per day     Past Surgical History:  Procedure Laterality Date  . LUMBAR FUSION    . PROSTATE SURGERY    . ROTATOR CUFF REPAIR      Social History:  reports that he has been smoking Cigarettes.  He has a 52.00 pack-year smoking history. He does not have any smokeless tobacco history on file. He reports that he does not drink alcohol or use drugs.  Allergies:  Allergies  Allergen Reactions  . Ibuprofen Rash    Had no trouble breathing    Medications Prior to Admission  Medication Sig Dispense Refill  . ALPRAZolam (XANAX) 1 MG tablet Take 1 mg by mouth 3 (three) times daily as needed for anxiety.    .Marland Kitchenaspirin EC 81 MG tablet Take 81 mg by mouth daily.    . metFORMIN (GLUCOPHAGE) 500 MG tablet Take 2 tablets (1,000 mg total) by mouth 2 (two) times daily. 120 tablet 1  . oxyCODONE-acetaminophen (PERCOCET) 10-325 MG per tablet  Take 1 tablet by mouth 3 (three) times daily.      Blood pressure 121/60, pulse 87, temperature 98.8 F (37.1 C), temperature source Oral, resp. rate 18, height _0  (1.905 m), weight 164 lb 3.9 oz (74.5 kg), SpO2 95 %.  Physical Exam: General: pleasant, WD/WN thin, elderly man who is laying in bed in NAD HEENT: head is  normocephalic, atraumatic.  Sclera are noninjected and no scleral icterus.  Oral mucosa is pink and moist Heart: regular, rate, and rhythm.  No obvious murmurs, gallops, or rubs noted.  2+ radial and PT pulses bilaterally Lungs: CTAB, no wheezes, rhonchi, or rales noted. Decreased breath sounds throughout all lung fileds. Respiratory effort nonlabored Abd: soft, ND, +BS, TTP in the RUQ, no masses or hernias appreciated, old incisional scar noted midline below umbilicus MS: all 4 extremities are symmetrical, full ROM, no edema Skin: warm and dry with no rashes noted Psych: A&Ox3 with an appropriate affect. Neuro: CM grossly 2-12 intact, normal speech  Results for orders placed or performed during the hospital encounter of 03/30/16 (from the past 48 hour(s))  Hemoglobin A1c     Status: Abnormal   Collection Time: 03/30/16  6:39 AM  Result Value Ref Range   Hgb A1c MFr Bld 11.2 (H) 4.8 - 5.6 %    Comment: (NOTE)         Pre-diabetes: 5.7 - 6.4         Diabetes: >6.4         Glycemic control for adults with diabetes: <7.0    Mean Plasma Glucose 275 mg/dL    Comment: (NOTE) Performed At: [facility] [place], [place] [id number] Lindon Romp MD HT:[id number]   Comprehensive metabolic panel     Status: Abnormal   Collection Time: 03/30/16  7:39 AM  Result Value Ref Range   Sodium 134 (L) 135 - 145 mmol/L   Potassium 4.1 3.5 - 5.1 mmol/L   Chloride 102 101 - 111 mmol/L   CO2 24 22 - 32 mmol/L   Glucose, Bld 186 (H) 65 - 99 mg/dL   BUN 12 6 - 20 mg/dL   Creatinine, Ser 0.90 0.61 - 1.24 mg/dL   Calcium 8.6 (L) 8.9 - 10.3 mg/dL   Total Protein 5.9 (L) 6.5 - 8.1 g/dL   Albumin 3.3 (L) 3.5 - 5.0 g/dL   AST 196 (H) 15 - 41 U/L   ALT 248 (H) 17 - 63 U/L   Alkaline Phosphatase 117 38 - 126 U/L   Total Bilirubin 3.3 (H) 0.3 - 1.2 mg/dL   GFR calc non Af Amer >60 >60 mL/min   GFR calc Af Amer >60 >60 mL/min    Comment: (NOTE) The eGFR has been calculated using  the CKD EPI equation. This calculation has not been validated in all clinical situations. eGFR's persistently <60 mL/min signify possible Chronic Kidney Disease.    Anion gap 8 5 - 15  CBC with Differential/Platelet     Status: Abnormal   Collection Time: 03/30/16  7:39 AM  Result Value Ref Range   WBC 11.6 (H) 4.0 - 10.5 K/uL   RBC 4.52 4.22 - 5.81 MIL/uL   Hemoglobin 14.0 13.0 - 17.0 g/dL   HCT 40.5 39.0 - 52.0 %   MCV 89.6 78.0 - 100.0 fL   MCH 31.0 26.0 - 34.0 pg   MCHC 34.6 30.0 - 36.0 g/dL   RDW 13.3 11.5 - 15.5 %   Platelets 187 150 -  400 K/uL   Neutrophils Relative % 86 %   Neutro Abs 10.0 (H) 1.7 - 7.7 K/uL   Lymphocytes Relative 8 %   Lymphs Abs 0.9 0.7 - 4.0 K/uL   Monocytes Relative 6 %   Monocytes Absolute 0.6 0.1 - 1.0 K/uL   Eosinophils Relative 0 %   Eosinophils Absolute 0.0 0.0 - 0.7 K/uL   Basophils Relative 0 %   Basophils Absolute 0.0 0.0 - 0.1 K/uL  Troponin I     Status: None   Collection Time: 03/30/16  9:19 AM  Result Value Ref Range   Troponin I <0.03 <0.03 ng/mL  Glucose, capillary     Status: Abnormal   Collection Time: 03/30/16 11:30 AM  Result Value Ref Range   Glucose-Capillary 158 (H) 65 - 99 mg/dL  Troponin I     Status: None   Collection Time: 03/30/16  3:52 PM  Result Value Ref Range   Troponin I <0.03 <0.03 ng/mL  Glucose, capillary     Status: Abnormal   Collection Time: 03/30/16  5:02 PM  Result Value Ref Range   Glucose-Capillary 195 (H) 65 - 99 mg/dL  Glucose, capillary     Status: Abnormal   Collection Time: 03/30/16  9:20 PM  Result Value Ref Range   Glucose-Capillary 237 (H) 65 - 99 mg/dL  CBC     Status: None   Collection Time: 03/31/16  3:56 AM  Result Value Ref Range   WBC 5.5 4.0 - 10.5 K/uL   RBC 4.40 4.22 - 5.81 MIL/uL   Hemoglobin 13.5 13.0 - 17.0 g/dL   HCT 40.3 39.0 - 52.0 %   MCV 91.6 78.0 - 100.0 fL   MCH 30.7 26.0 - 34.0 pg   MCHC 33.5 30.0 - 36.0 g/dL   RDW 13.9 11.5 - 15.5 %   Platelets 162 150 - 400  K/uL  Comprehensive metabolic panel     Status: Abnormal   Collection Time: 03/31/16  3:56 AM  Result Value Ref Range   Sodium 135 135 - 145 mmol/L   Potassium 4.3 3.5 - 5.1 mmol/L   Chloride 105 101 - 111 mmol/L   CO2 26 22 - 32 mmol/L   Glucose, Bld 156 (H) 65 - 99 mg/dL   BUN 9 6 - 20 mg/dL   Creatinine, Ser 0.82 0.61 - 1.24 mg/dL   Calcium 8.3 (L) 8.9 - 10.3 mg/dL   Total Protein 5.5 (L) 6.5 - 8.1 g/dL   Albumin 2.9 (L) 3.5 - 5.0 g/dL   AST 99 (H) 15 - 41 U/L   ALT 170 (H) 17 - 63 U/L   Alkaline Phosphatase 108 38 - 126 U/L   Total Bilirubin 2.1 (H) 0.3 - 1.2 mg/dL   GFR calc non Af Amer >60 >60 mL/min   GFR calc Af Amer >60 >60 mL/min    Comment: (NOTE) The eGFR has been calculated using the CKD EPI equation. This calculation has not been validated in all clinical situations. eGFR's persistently <60 mL/min signify possible Chronic Kidney Disease.    Anion gap 4 (L) 5 - 15  Lipase, blood     Status: Abnormal   Collection Time: 03/31/16  3:56 AM  Result Value Ref Range   Lipase 52 (H) 11 - 51 U/L  Glucose, capillary     Status: Abnormal   Collection Time: 03/31/16  7:50 AM  Result Value Ref Range   Glucose-Capillary 166 (H) 65 - 99 mg/dL   Dg Ercp Biliary &  Pancreatic Ducts  Result Date: 03/30/2016 CLINICAL DATA:  70 year old male with a history of ERCP. EXAM: ERCP TECHNIQUE: Multiple spot images obtained with the fluoroscopic device and submitted for interpretation post-procedure. FLUOROSCOPY TIME:  7 minutes 35 second COMPARISON:  07/30/2013, CT 03/30/2016 FINDINGS: Endoscope projects over the upper abdomen. Cannulation of the ampulla with retrograde infusion of contrast partially opacifying the extrahepatic biliary system. Multiple filling defects within the ductal system. Final image demonstrates plastic stent in place. IMPRESSION: Limited fluoroscopic spot images during ERCP demonstrates evidence of choledocholithiasis, with placement of biliary stent. Please refer to  the dictated operative report for full details of intraoperative findings and procedure. Signed, Dulcy Fanny. Earleen Newport, DO [facility] Radiology Electronically Signed   By: Corrie Mckusick D.O.   On: 03/30/2016 15:11      Assessment/Plan  Choledocholithiasis  S/P ERCP 12/19 with sphinc and removal of extensive collection of CBD stones followed by stent placement. Multiple stones remain, many large  Biliary pancreatitis: lipase improving Elevated LFT's and bilirubin: Improving Dm 2 Emphysema  FEN: clears VTE: SCD's & lovenox  Plan: Will discuss with Dr. Kae Heller to determine if this pt would benefit from transfer to [facility] or surgery here at [facility]. We will follow this patient. Thank you for the consult.   Kalman Drape, [facility] Surgery 03/31/2016, 11:04 AM Pager: [phone] Consults: [phone] Mon-Fri 7:00 am-4:30 pm Sat-Sun 7:00 am-11:30 am

## 2016-04-01 NOTE — Progress Notes (Addendum)
Pt to preop via bed. NPO maint, report given to [facility].   1200 Received pt back from PACU. Pt with 4 lap sites, w/ dermabond dry and intact. Abd soft, c/o surgical pain. Pain meds given as needed.

## 2016-04-01 NOTE — Interval H&P Note (Signed)
History and Physical Interval Note:  04/01/2016 8:42 AM  Bruce Franklin  has presented today for surgery, with the diagnosis of Gallstone Pancreatitis  The various methods of treatment have been discussed with the patient and family. After consideration of risks, benefits and other options for treatment, the patient has consented to  Procedure(s): LAPAROSCOPIC CHOLECYSTECTOMY (N/A) as a surgical intervention .  The patient's history has been reviewed, patient examined, no change in status, stable for surgery.  I have reviewed the patient's chart and labs.  Questions were answered to the patient's satisfaction.     Karver Fadden Rich Brave

## 2016-04-01 NOTE — Transfer of Care (Signed)
Immediate Anesthesia Transfer of Care Note  Patient: Bruce Franklin  Procedure(s) Performed: Procedure(s): LAPAROSCOPIC CHOLECYSTECTOMY (N/A)  Patient Location: PACU  Anesthesia Type:General  Level of Consciousness: awake, alert  and oriented  Airway & Oxygen Therapy: Patient Spontanous Breathing and Patient connected to nasal cannula oxygen  Post-op Assessment: Report given to RN, Post -op Vital signs reviewed and stable and Patient moving all extremities X 4  Post vital signs: Reviewed and stable  Last Vitals:  Vitals:   03/31/16 2225 04/01/16 0553  BP: 122/67 (!) 145/72  Pulse: 72 69  Resp: 18 18  Temp: 36.7 C 36.6 C    Last Pain:  Vitals:   04/01/16 0625  TempSrc:   PainSc: Asleep      Patients Stated Pain Goal: 2 ([id number])  Complications: No apparent anesthesia complications

## 2016-04-01 NOTE — Anesthesia Postprocedure Evaluation (Signed)
Anesthesia Post Note  Patient: Bruce Franklin  Procedure(s) Performed: Procedure(s) (LRB): LAPAROSCOPIC CHOLECYSTECTOMY (N/A)  Patient location during evaluation: PACU Anesthesia Type: General Level of consciousness: oriented, patient cooperative and sedated Pain management: pain level controlled Vital Signs Assessment: post-procedure vital signs reviewed and stable Respiratory status: spontaneous breathing, nonlabored ventilation, respiratory function stable and patient connected to nasal cannula oxygen Cardiovascular status: blood pressure returned to baseline and stable Postop Assessment: no signs of nausea or vomiting Anesthetic complications: no       Last Vitals:  Vitals:   04/01/16 1150 04/01/16 1212  BP: 111/75 134/65  Pulse: 80 87  Resp: 14   Temp: 36.7 C 37.1 C    Last Pain:  Vitals:   04/01/16 1222  TempSrc:   PainSc: 8                  Naevia Unterreiner,E. Shantina Chronister

## 2016-04-01 NOTE — Anesthesia Preprocedure Evaluation (Addendum)
Anesthesia Evaluation  Patient identified by MRN, date of birth, ID band Patient awake    Reviewed: Allergy & Precautions, NPO status , Patient's Chart, lab work & pertinent test results  History of Anesthesia Complications Negative for: history of anesthetic complications  Airway Mallampati: I  TM Distance: >3 FB Neck ROM: Full    Dental  (+) Edentulous Upper, Edentulous Lower   Pulmonary COPD,  COPD inhaler, Current Smoker,    breath sounds clear to auscultation       Cardiovascular (-) anginanegative cardio ROS   Rhythm:Regular Rate:Normal     Neuro/Psych    GI/Hepatic negative GI ROS, Elevated LFTs   Endo/Other  diabetes (glu 131), Oral Hypoglycemic Agents, Insulin Dependent  Renal/GU stones     Musculoskeletal  (+) Arthritis ,   Abdominal   Peds  Hematology negative hematology ROS (+)   Anesthesia Other Findings   Reproductive/Obstetrics                            Anesthesia Physical Anesthesia Plan  ASA: II  Anesthesia Plan: General   Post-op Pain Management:    Induction: Intravenous  Airway Management Planned: Oral ETT  Additional Equipment:   Intra-op Plan:   Post-operative Plan: Extubation in OR  Informed Consent: I have reviewed the patients History and Physical, chart, labs and discussed the procedure including the risks, benefits and alternatives for the proposed anesthesia with the patient or authorized representative who has indicated his/her understanding and acceptance.     Plan Discussed with: CRNA and Surgeon  Anesthesia Plan Comments: (Plan routine monitors, GETA)        Anesthesia Quick Evaluation

## 2016-04-02 ENCOUNTER — Encounter (HOSPITAL_COMMUNITY): Payer: Self-pay | Admitting: Surgery

## 2016-04-02 DIAGNOSIS — E871 Hypo-osmolality and hyponatremia: Secondary | ICD-10-CM

## 2016-04-02 DIAGNOSIS — R740 Nonspecific elevation of levels of transaminase and lactic acid dehydrogenase [LDH]: Secondary | ICD-10-CM

## 2016-04-02 DIAGNOSIS — Z794 Long term (current) use of insulin: Secondary | ICD-10-CM

## 2016-04-02 DIAGNOSIS — K8071 Calculus of gallbladder and bile duct without cholecystitis with obstruction: Secondary | ICD-10-CM

## 2016-04-02 DIAGNOSIS — K8020 Calculus of gallbladder without cholecystitis without obstruction: Secondary | ICD-10-CM

## 2016-04-02 DIAGNOSIS — E119 Type 2 diabetes mellitus without complications: Secondary | ICD-10-CM

## 2016-04-02 DIAGNOSIS — C61 Malignant neoplasm of prostate: Secondary | ICD-10-CM

## 2016-04-02 DIAGNOSIS — E875 Hyperkalemia: Secondary | ICD-10-CM

## 2016-04-02 DIAGNOSIS — K8043 Calculus of bile duct with acute cholecystitis with obstruction: Secondary | ICD-10-CM

## 2016-04-02 LAB — COMPREHENSIVE METABOLIC PANEL
ALK PHOS: 93 U/L (ref 38–126)
ALT: 83 U/L — AB (ref 17–63)
ANION GAP: 8 (ref 5–15)
AST: 33 U/L (ref 15–41)
Albumin: 2.7 g/dL — ABNORMAL LOW (ref 3.5–5.0)
BUN: <5 mg/dL — ABNORMAL LOW (ref 6–20)
CALCIUM: 8.5 mg/dL — AB (ref 8.9–10.3)
CO2: 26 mmol/L (ref 22–32)
CREATININE: 0.79 mg/dL (ref 0.61–1.24)
Chloride: 103 mmol/L (ref 101–111)
Glucose, Bld: 136 mg/dL — ABNORMAL HIGH (ref 65–99)
Potassium: 3.7 mmol/L (ref 3.5–5.1)
SODIUM: 137 mmol/L (ref 135–145)
TOTAL PROTEIN: 5.3 g/dL — AB (ref 6.5–8.1)
Total Bilirubin: 0.8 mg/dL (ref 0.3–1.2)

## 2016-04-02 LAB — GLUCOSE, CAPILLARY
GLUCOSE-CAPILLARY: 155 mg/dL — AB (ref 65–99)
Glucose-Capillary: 193 mg/dL — ABNORMAL HIGH (ref 65–99)

## 2016-04-02 LAB — CBC
HCT: 39.3 % (ref 39.0–52.0)
HEMOGLOBIN: 13.1 g/dL (ref 13.0–17.0)
MCH: 29.9 pg (ref 26.0–34.0)
MCHC: 33.3 g/dL (ref 30.0–36.0)
MCV: 89.7 fL (ref 78.0–100.0)
PLATELETS: 163 10*3/uL (ref 150–400)
RBC: 4.38 MIL/uL (ref 4.22–5.81)
RDW: 13.4 % (ref 11.5–15.5)
WBC: 5.4 10*3/uL (ref 4.0–10.5)

## 2016-04-02 LAB — PROTIME-INR
INR: 0.99
PROTHROMBIN TIME: 13.1 s (ref 11.4–15.2)

## 2016-04-02 MED ORDER — OXYCODONE HCL 5 MG/5ML PO SOLN: 5.0000 mg | ORAL | 0 refills | Status: DC | PRN

## 2016-04-02 MED ORDER — AMOXICILLIN-POT CLAVULANATE 875-125 MG PO TABS: 1.0000 | ORAL_TABLET | Freq: Two times a day (BID) | ORAL | 0 refills | Status: DC

## 2016-04-02 MED ORDER — POLYETHYLENE GLYCOL 3350 17 G PO PACK: 17.0000 g | PACK | Freq: Two times a day (BID) | ORAL | 0 refills | Status: DC

## 2016-04-02 MED ORDER — NICOTINE 21 MG/24HR TD PT24: 21.0000 mg | MEDICATED_PATCH | Freq: Every day | TRANSDERMAL | 0 refills | Status: DC

## 2016-04-02 NOTE — Discharge Summary (Signed)
Physician Discharge Summary  Bruce Franklin [id number] DOB: [birth year] DOA: 03/30/2016  PCP: Tamsen Roers, MD  Admit date: 03/30/2016 Discharge date: 04/02/2016  Admitted From: home  Disposition:  home   Recommendations for Outpatient Follow-up:  1. F/u PCP in 2 wk- need further nicotine patches if he is adherent with them 2. Patient is aware that he needs to f/u with Dr Ardis Hughs in 4 wks.    Discharge Condition:  stable   CODE STATUS:  Full code   Diet recommendation:  Heart healthy, low fat Consultations:  GI  Gen surgery    Discharge Diagnoses:  Principal Problem:   Acute biliary pancreatitis Active Problems:   Bile duct stone   Cholelithiases   TOBACCO DEPENDENCE   Malnutrition of moderate degree (HCC)   Elevated transaminase level   COPD (chronic obstructive pulmonary disease) (HCC)   Diabetes mellitus without complication (HCC)   Prostate cancer (HCC)   Hyponatremia   Hyperkalemia    Subjective: Mild abdominal discomfort due to surgery. No nausea, vomiting, constipation, diarrhea fever or chills.   HPI: Bruce Franklin is a 70 y.o. male with medical history significant for PVD, tobacco use, diabetes, prostate cancer status post prostatectomy, anxiety is a direct admit from [facility] on 12/19 with a chief complaint of abdominal pain and "hurting all over". Initial evaluation in [place] reveals cholelithiasis,choledocholithiasis, hyponatremia, elevated transaminases and Lipase. Transferred [facility].    Hospital Course:  Gallstone pancreatitis/choledocholithiasis - ERCP 12/19- sphincterotomy and removal of extensive collection of CBD stones followed by stent placement.  - Multiple stones remain- LFTs trending down -  plan per GI is to allow remaining stones to soften and easier to remove with repeat ERCP in 4-5 wks - also recommended to complete 3-4 days of antibiotics- I have transitioned him from Zosyn to Augmentin  today  Cholelithiasis  - Status post cholecystectomy 12/20- stable- Oxycodone with Miralax prescribed  Hyponatremia/hyperkalemia.  - Sodium level 127 at [facility], potassium 5.8 likely related to #1 in the setting of decreased oral intake and elevated serum glucose.  - resolved   IDDM - Serum glucose at [place]. Home medications include Humalog 75/25 twice a day and metformin-A1c11.2 with poor control - can resume home meds tomorrow  COPD - Not on home oxygen. Appears stable -- imaging [facility] reveals emphysema changes in bases.  Tobacco use. -Cessation counseling offered -Nicotine patch prescribed  - if he is adherent with treatment, further patches can be prescribed at f/u with PCP  Discharge Instructions  Discharge Instructions    Diet - low sodium heart healthy    Complete by:  As directed    Low fat   Increase activity slowly    Complete by:  As directed      Allergies as of 04/02/2016      Reactions   Ibuprofen Rash   Had no trouble breathing      Medication List    STOP taking these medications   oxyCODONE-acetaminophen 10-325 MG tablet Commonly known as:  PERCOCET     TAKE these medications   ALPRAZolam 1 MG tablet Commonly known as:  XANAX Take 1 mg by mouth 3 (three) times daily as needed for anxiety.   amoxicillin-clavulanate 875-125 MG tablet Commonly known as:  AUGMENTIN Take 1 tablet by mouth 2 (two) times daily.   aspirin EC 81 MG tablet Take 81 mg by mouth daily.   metFORMIN 500 MG tablet Commonly known as:  GLUCOPHAGE Take 2 tablets (  1,000 mg total) by mouth 2 (two) times daily.   nicotine 21 mg/24hr patch Commonly known as:  NICODERM CQ - dosed in mg/24 hours Place 1 patch (21 mg total) onto the skin daily.   oxyCODONE 5 MG/5ML solution Commonly known as:  ROXICODONE Take 5-10 mLs (5-10 mg total) by mouth every 4 (four) hours as needed for severe pain.   polyethylene glycol packet Commonly known as:  MIRALAX /  GLYCOLAX Take 17 g by mouth 2 (two) times daily.      Follow-up [facility] Follow up.   Specialty:  General Surgery Why:  for post-operative follow up in 2-3 weeks.  please call to confirm appointment date/time. Contact information: [address] [place] [phone]       Milus Banister, MD Follow up in 4 day(s).   Specialty:  Gastroenterology Why:  4-5 wks Contact information: [address] [place] [address] [phone]        Tamsen Roers, MD Follow up.   Specialty:  Family Medicine Contact information: [address] [place] [address] [phone]          Allergies  Allergen Reactions  . Ibuprofen Rash    Had no trouble breathing     Procedures/Studies: Lap cholecystectomy 12/21  Dg Ercp Biliary & Pancreatic Ducts  Result Date: 03/30/2016 CLINICAL DATA:  70 year old male with a history of ERCP. EXAM: ERCP TECHNIQUE: Multiple spot images obtained with the fluoroscopic device and submitted for interpretation post-procedure. FLUOROSCOPY TIME:  7 minutes 35 second COMPARISON:  07/30/2013, CT 03/30/2016 FINDINGS: Endoscope projects over the upper abdomen. Cannulation of the ampulla with retrograde infusion of contrast partially opacifying the extrahepatic biliary system. Multiple filling defects within the ductal system. Final image demonstrates plastic stent in place. IMPRESSION: Limited fluoroscopic spot images during ERCP demonstrates evidence of choledocholithiasis, with placement of biliary stent. Please refer to the dictated operative report for full details of intraoperative findings and procedure. Signed, Dulcy Fanny. Earleen Newport, DO [facility] Electronically Signed   By: Corrie Mckusick D.O.   On: 03/30/2016 15:11       Discharge Exam: Vitals:   04/02/16 0558 04/02/16 0948  BP: 124/66 136/63  Pulse: 69 70   Resp: 16   Temp: 98.6 F (37 C) 98 F (36.7 C)   Vitals:   04/01/16 2120 04/02/16 0244 04/02/16 0558 04/02/16 0948  BP: 127/79 130/74 124/66 136/63  Pulse: 70 73 69 70  Resp:   16   Temp: 99 F (37.2 C) 98.8 F (37.1 C) 98.6 F (37 C) 98 F (36.7 C)  TempSrc: Oral Oral Oral Oral  SpO2: 94% 93% 97% 92%  Weight:      Height:        General: Pt is alert, awake, not in acute distress Cardiovascular: RRR, S1/S2 +, no rubs, no gallops Respiratory: CTA bilaterally, no wheezing, no rhonchi Abdominal: Soft, NT, ND, bowel sounds + Extremities: no edema, no cyanosis   - The results of significant diagnostics from this hospitalization (including imaging, microbiology, ancillary and laboratory) are listed below for reference.     Microbiology: Recent Results (from the past 240 hour(s))  Surgical pcr screen     Status: None   Collection Time: 03/31/16  8:23 PM  Result Value Ref Range Status   MRSA, PCR NEGATIVE NEGATIVE Final   Staphylococcus aureus NEGATIVE NEGATIVE Final    Comment:  The Xpert SA Assay (FDA approved for NASAL specimens in patients over 74 years of age), is one component of a comprehensive surveillance program.  Test performance has been validated by [facility] for patients greater than or equal to 13 year old. It is not intended to diagnose infection nor to guide or monitor treatment.      Labs: BNP (last 3 results) No results for input(s): BNP in the last 8760 hours. Basic Metabolic Panel:  Recent Labs Lab 03/30/16 0739 03/31/16 0356 04/01/16 0410 04/02/16 0407  NA 134* 135 137 137  K 4.1 4.3 3.8 3.7  CL 102 105 107 103  CO2 24 26 24 26   GLUCOSE 186* 156* 116* 136*  BUN 12 9 5* <5*  CREATININE 0.90 0.82 0.73 0.79  CALCIUM 8.6* 8.3* 8.4* 8.5*   Liver Function Tests:  Recent Labs Lab 03/30/16 0739 03/31/16 0356 04/01/16 0410 04/02/16 0407  AST 196* 99* 45* 33  ALT 248* 170* 116* 83*  ALKPHOS 117 108 103 93  BILITOT 3.3*  2.1* 0.9 0.8  PROT 5.9* 5.5* 5.3* 5.3*  ALBUMIN 3.3* 2.9* 2.8* 2.7*    Recent Labs Lab 03/31/16 0356  LIPASE 52*   No results for input(s): AMMONIA in the last 168 hours. CBC:  Recent Labs Lab 03/30/16 0739 03/31/16 0356 04/01/16 0410 04/02/16 0407  WBC 11.6* 5.5 4.6 5.4  NEUTROABS 10.0*  --   --   --   HGB 14.0 13.5 12.7* 13.1  HCT 40.5 40.3 37.9* 39.3  MCV 89.6 91.6 90.5 89.7  PLT 187 162 143* 163   Cardiac Enzymes:  Recent Labs Lab 03/30/16 0919 03/30/16 1552  TROPONINI <0.03 <0.03   BNP: Invalid input(s): POCBNP CBG:  Recent Labs Lab 04/01/16 1216 04/01/16 1629 04/01/16 2120 04/02/16 0734 04/02/16 1121  GLUCAP 161* 167* 156* 155* 193*   D-Dimer No results for input(s): DDIMER in the last 72 hours. Hgb A1c No results for input(s): HGBA1C in the last 72 hours. Lipid Profile No results for input(s): CHOL, HDL, LDLCALC, TRIG, CHOLHDL, LDLDIRECT in the last 72 hours. Thyroid function studies No results for input(s): TSH, T4TOTAL, T3FREE, THYROIDAB in the last 72 hours.  Invalid input(s): FREET3 Anemia work up No results for input(s): VITAMINB12, FOLATE, FERRITIN, TIBC, IRON, RETICCTPCT in the last 72 hours. Urinalysis    Component Value Date/Time   COLORURINE YELLOW 11/14/2014 [place] 11/14/2014 1851   LABSPEC 1.037 (H) 11/14/2014 1851   PHURINE 5.0 11/14/2014 1851   GLUCOSEU >1000 (A) 11/14/2014 1851   HGBUR SMALL (A) 11/14/2014 1851   BILIRUBINUR NEGATIVE 11/14/2014 1851   KETONESUR 40 (A) 11/14/2014 1851   PROTEINUR 30 (A) 11/14/2014 1851   UROBILINOGEN 0.2 11/14/2014 1851   NITRITE NEGATIVE 11/14/2014 1851   LEUKOCYTESUR NEGATIVE 11/14/2014 1851   Sepsis Labs Invalid input(s): PROCALCITONIN,  WBC,  LACTICIDVEN Microbiology Recent Results (from the past 240 hour(s))  Surgical pcr screen     Status: None   Collection Time: 03/31/16  8:23 PM  Result Value Ref Range Status   MRSA, PCR NEGATIVE NEGATIVE Final    Staphylococcus aureus NEGATIVE NEGATIVE Final    Comment:        The Xpert SA Assay (FDA approved for NASAL specimens in patients over 60 years of age), is one component of a comprehensive surveillance program.  Test performance has been validated by [facility] for patients greater than or equal to 20 year old. It is not intended to diagnose infection nor to  guide or monitor treatment.      Time coordinating discharge: Over 30 minutes  SIGNED:   Debbe Odea, MD  [facility] 04/02/2016, 2:20 PM Pager   If 7PM-7AM, please contact night-coverage [email or web address] Password [email or web address]

## 2016-04-02 NOTE — Discharge Instructions (Signed)
Please take all your medications with you for your next visit with your Primary MD. Please request your Primary MD to go over all hospital test results at the follow up. Please ask your Primary MD to get all Hospital records sent to his/her office.  If you experience worsening of your admission symptoms, develop shortness of breath, chest pain, suicidal or homicidal thoughts or a life threatening emergency, you must seek medical attention immediately by calling 911 or calling your MD.  Dennis Bast must read the complete instructions/literature along with all the possible adverse reactions/side effects for all the medicines you take including new medications that have been prescribed to you. Take new medicines after you have completely understood and accpet all the possible adverse reactions/side effects.   Do not drive when taking pain medications or sedatives.    Do not take more than prescribed Pain, Sleep and Anxiety Medications  If you have smoked or chewed Tobacco in the last 2 yrs please stop. Stop any regular alcohol and or recreational drug use.  Wear Seat belts while driving.    General Surgery:  please arrive at least 30 min before your appointment to complete your check in paperwork.  If you are unable to arrive 30 min prior to your appointment time we may have to cancel or reschedule you.  LAPAROSCOPIC SURGERY: POST OP INSTRUCTIONS  1. DIET: Follow a light bland diet the first 24 hours after arrival home, such as soup, liquids, crackers, etc. Be sure to include lots of fluids daily. Avoid fast food or heavy meals as your are more likely to get nauseated. Eat a low fat the next few days after surgery.  2. Take your usually prescribed home medications unless otherwise directed. 3. PAIN CONTROL:  1. Pain is best controlled by a usual combination of three different methods TOGETHER:  1. Ice/Heat 2. Over the counter pain medication 3. Prescription pain medication 2. Most patients  will experience some swelling and bruising around the incisions. Ice packs or heating pads (30-60 minutes up to 6 times a day) will help. Use ice for the first few days to help decrease swelling and bruising, then switch to heat to help relax tight/sore spots and speed recovery. Some people prefer to use ice alone, heat alone, alternating between ice & heat. Experiment to what works for you. Swelling and bruising can take several weeks to resolve.  3. It is helpful to take an over-the-counter pain medication regularly for the first few weeks. Choose one of the following that works best for you:  1. Naproxen (Aleve, etc) Two 220mg  tabs twice a day 2. Ibuprofen (Advil, etc) Three 200mg  tabs four times a day (every meal & bedtime) 3. Acetaminophen (Tylenol, etc) 500-650mg  four times a day (every meal & bedtime) 4. A prescription for pain medication (such as oxycodone, hydrocodone, etc) should be given to you upon discharge. Take your pain medication as prescribed.  1. If you are having problems/concerns with the prescription medicine (does not control pain, nausea, vomiting, rash, itching, etc), please call us [phone] to see if we need to switch you to a different pain medicine that will work better for you and/or control your side effect better. 2. If you need a refill on your pain medication, please contact your pharmacy. They will contact our office to request authorization. Prescriptions will not be filled after 5 pm or on week-ends. 4. Avoid getting constipated. Between the surgery and the pain medications, it is common to experience some constipation.  Increasing fluid intake and taking a fiber supplement (such as Metamucil, Citrucel, FiberCon, MiraLax, etc) 1-2 times a day regularly will usually help prevent this problem from occurring. A mild laxative (prune juice, Milk of Magnesia, MiraLax, etc) should be taken according to package directions if there are no bowel movements after 48 hours.   5. Watch out for diarrhea. If you have many loose bowel movements, simplify your diet to bland foods & liquids for a few days. Stop any stool softeners and decrease your fiber supplement. Switching to mild anti-diarrheal medications (Kayopectate, Pepto Bismol) can help. If this worsens or does not improve, please call us. 6. Wash / shower every day. You may shower over the dressings as they are waterproof. Continue to shower over incision(s) after the dressing is off. 7. Remove your waterproof bandages 5 days after surgery. You may leave the incision open to air. You may replace a dressing/Band-Aid to cover the incision for comfort if you wish.  8. ACTIVITIES as tolerated:  1. You may resume regular (light) daily activities beginning the next day--such as daily self-care, walking, climbing stairs--gradually increasing activities as tolerated. If you can walk 30 minutes without difficulty, it is safe to try more intense activity such as jogging, treadmill, bicycling, low-impact aerobics, swimming, etc. 2. Save the most intensive and strenuous activity for last such as sit-ups, heavy lifting, contact sports, etc Refrain from any heavy lifting or straining until you are off narcotics for pain control.  3. DO NOT PUSH THROUGH PAIN. Let pain be your guide: If it hurts to do something, don't do it. Pain is your body warning you to avoid that activity for another week until the pain goes down. 4. You may drive when you are no longer taking prescription pain medication, you can comfortably wear a seatbelt, and you can safely maneuver your car and apply brakes. 5. You may have sexual intercourse when it is comfortable.  9. FOLLOW UP in our office  1. Please call [facility] at [phone] to set up an appointment to see your surgeon in the office for a follow-up appointment approximately 2-3 weeks after your surgery. 2. Make sure that you call for this appointment the day you arrive home to insure a convenient  appointment time.      10. IF YOU HAVE DISABILITY OR FAMILY LEAVE FORMS, BRING THEM TO THE               OFFICE FOR PROCESSING.   WHEN TO CALL us [phone]:  1. Poor pain control 2. Reactions / problems with new medications (rash/itching, nausea, etc)  3. Fever over 101.5 F (38.5 C) 4. Inability to urinate 5. Nausea and/or vomiting 6. Worsening swelling or bruising 7. Continued bleeding from incision. 8. Increased pain, redness, or drainage from the incision  The clinic staff is available to answer your questions during regular business hours (8:30am-5pm). Please dont hesitate to call and ask to speak to one of our nurses for clinical concerns.  If you have a medical emergency, go to the nearest emergency room or call 911.  A surgeon from [facility] is always on call at the [facility], [place], [address], [place], [place] [address] ?  MAIN: [phone] ? TOLL FREE: [phone] ?  FAX [phone]  [email or web address]

## 2016-04-02 NOTE — Progress Notes (Signed)
[facility] Progress Note    Since last GI note: Underwent lap chole yesterday, sounds like a pretty inflamed GB. No IOC.  He has some expected incisional pain but overall feels much better.  Objective: Vital signs in last 24 hours: Temp:  [98.1 F (36.7 C)-99 F (37.2 C)] 98.6 F (37 C) (12/22 0558) Pulse Rate:  [69-87] 69 (12/22 0558) Resp:  [13-17] 16 (12/22 0558) BP: (111-155)/(60-98) 124/66 (12/22 0558) SpO2:  [91 %-97 %] 97 % (12/22 0558) Last BM Date: 03/29/16 General: alert and oriented times 3 Heart: regular rate and rythm Abdomen: soft, mildly tender, non-distended, normal bowel sounds   Lab Results:  Recent Labs  03/31/16 0356 04/01/16 0410 04/02/16 0407  WBC 5.5 4.6 5.4  HGB 13.5 12.7* 13.1  PLT 162 143* 163  MCV 91.6 90.5 89.7    Recent Labs  03/31/16 0356 04/01/16 0410 04/02/16 0407  NA 135 137 137  K 4.3 3.8 3.7  CL 105 107 103  CO2 26 24 26   GLUCOSE 156* 116* 136*  BUN 9 5* <5*  CREATININE 0.82 0.73 0.79  CALCIUM 8.3* 8.4* 8.5*    Recent Labs  03/31/16 0356 04/01/16 0410 04/02/16 0407  PROT 5.5* 5.3* 5.3*  ALBUMIN 2.9* 2.8* 2.7*  AST 99* 45* 33  ALT 170* 116* 83*  ALKPHOS 108 103 93  BILITOT 2.1* 0.9 0.8    Recent Labs  04/02/16 0407  INR 0.99    Medications: Scheduled Meds: . docusate sodium  100 mg Oral BID  . insulin aspart  0-5 Units Subcutaneous QHS  . insulin aspart  0-9 Units Subcutaneous TID WC  . nicotine  21 mg Transdermal Daily  . piperacillin-tazobactam (ZOSYN)  IV  3.375 g Intravenous Q8H   Continuous Infusions: . sodium chloride 50 mL/hr at 04/02/16 0553   PRN Meds:.HYDROmorphone (DILAUDID) injection, ondansetron **OR** ondansetron (ZOFRAN) IV, oxyCODONE-acetaminophen, polyvinyl alcohol    Assessment/Plan: 70 y.o. male with gallstone disease  Cholecystitis; uneventful lap chole without IOC yesterday.  Choledocholiathisis; partially treated with ERCP 3 days ago, currently has plastic  biliary stent in place. His LFT are normalizing (t bili normal now) and so I think the stent is functioning well.  My hope is that the stent will allow the remaining stones to soften and be easier to remove.  We will schedule repeat ERCP in about 4-5 weeks time (will plan to have Spy Glass direct cholangioscopy available at that time for contact lithotripsy if needed).  He is safe for d/c home from my perspective. He should complete another 3-4 days of oral antibiotics.  He understands the plan.  Please call or page with any further questions or concerns.    Milus Banister, MD  04/02/2016, 9:04 AM  Gastroenterology Pager [phone]

## 2016-04-02 NOTE — Care Management Important Message (Signed)
Important Message  Patient Details  Name: Bruce Franklin MRN: [id number] Date of Birth: 05-Jan-1946   Medicare Important Message Given:  Yes    Ranger Petrich [address] 04/02/2016, 11:06 AM

## 2016-04-02 NOTE — Progress Notes (Signed)
[facility] Progress Note  1 Day Post-Op  Subjective: C/o of a lot of abdominal tenderness around incisions. Tolerated a liquid diet and is being advanced to regular today. +flatus and a BM this morning. Denies fever, chills, nausea, or vomiting.   Hepatic Function Latest Ref Rng & Units 04/02/2016 04/01/2016 03/31/2016  Total Protein 6.5 - 8.1 g/dL 5.3(L) 5.3(L) 5.5(L)  Albumin 3.5 - 5.0 g/dL 2.7(L) 2.8(L) 2.9(L)  AST 15 - 41 U/L 33 45(H) 99(H)  ALT 17 - 63 U/L 83(H) 116(H) 170(H)  Alk Phosphatase 38 - 126 U/L 93 103 108  Total Bilirubin 0.3 - 1.2 mg/dL 0.8 0.9 2.1(H)  Bilirubin, Direct 0.1 - 0.5 mg/dL - - -   Objective: Vital signs in last 24 hours: Temp:  [98.1 F (36.7 C)-99 F (37.2 C)] 98.6 F (37 C) (12/22 0558) Pulse Rate:  [69-87] 69 (12/22 0558) Resp:  [13-17] 16 (12/22 0558) BP: (111-155)/(60-98) 124/66 (12/22 0558) SpO2:  [91 %-97 %] 97 % (12/22 0558) Last BM Date: 03/29/16  Intake/Output from previous day: 12/21 0701 - 12/22 0700 In: 2359.2 [P.O.:240; I.V.:2069.2; IV Piggyback:50] Out: 2675 [Urine:2645; Blood:30] I PE: Gen:  Alert, NAD, cooperative Pulm:  Unlabored, CTA, no W/R/R  Abd: Soft, appropriately tender, mild distention, +BS, incisions C/D/I  BMET  Recent Labs  04/01/16 0410 04/02/16 0407  NA 137 137  K 3.8 3.7  CL 107 103  CO2 24 26  GLUCOSE 116* 136*  BUN 5* <5*  CREATININE 0.73 0.79  CALCIUM 8.4* 8.5*      Studies/Results: No results found.  Anti-infectives: Anti-infectives    Start     Dose/Rate Route Frequency Ordered Stop   04/01/16 0600  ceFAZolin (ANCEF) IVPB 2g/100 mL premix     2 g 200 mL/hr over 30 Minutes Intravenous On call to O.R. 03/31/16 1928 04/01/16 0908   03/30/16 1200  piperacillin-tazobactam (ZOSYN) IVPB 3.375 g  Status:  Discontinued     3.375 g 12.5 mL/hr over 240 Minutes Intravenous Every 6 hours 03/30/16 0914 03/30/16 0914   03/30/16 0930  Ampicillin-Sulbactam (UNASYN) 3 g in sodium  chloride 0.9 % 100 mL IVPB  Status:  Discontinued     3 g 200 mL/hr over 30 Minutes Intravenous  Once 03/30/16 0928 03/30/16 0949   03/30/16 0930  piperacillin-tazobactam (ZOSYN) IVPB 3.375 g     3.375 g 12.5 mL/hr over 240 Minutes Intravenous Every 8 hours 03/30/16 0915         Assessment/Plan Acute biliary pancreatitis choledocholithiasis S/P ERCP w/ sphincterotomy and removal of CBD stones, stent placement - multiple stones remain. 03/30/16 S/P Laparoscopic cholecystectomy 04/01/16 Dr. Romana Juniper  LFT's continue to normalize, total bilirubin is normal  WBC 5.4 from 11.6 on admission - Patient is on day #4 IV zosyn  Tolerating diet and having bowel movements  Ambulate and IS   COPD Diabetes Mellitus Tobacco use  Plan: Clinically improving - encourage PO pain control and ambulation. LFT's continue to normalize. Total bilirubin and WBC normal. From a surgical standpoint the patient is stable for discharge once he tolerates a regular diet and is cleared by gastroenterology. He will need to follow up in the [place] office in 2-3 weeks.   LOS: 3 days    Jill Alexanders , [facility] 04/02/2016, 8:20 AM Pager: [phone] Consults: [phone] Mon-Fri 7:00 am-4:30 pm Sat-Sun 7:00 am-11:30 am

## 2016-04-02 NOTE — Plan of Care (Signed)
Problem: Safety: Goal: Ability to remain free from injury will improve Outcome: Progressing Patient remains free from falls and injury.   Problem: Pain Managment: Goal: General experience of comfort will improve Outcome: Not Progressing Patient complains of sharp pain to abdomen and frequently ask for pain medication. Will continue to assess patient.   Problem: Skin Integrity: Goal: Risk for impaired skin integrity will decrease Outcome: Progressing Patient is able to turn self in bed and patient has been getting out of bed to ambulate throughout shift.

## 2016-04-06 ENCOUNTER — Telehealth: Payer: Self-pay

## 2016-04-06 NOTE — Telephone Encounter (Signed)
Dr Ardis Hughs I received the note to set this pt up for ERCP in 4-5 weeks, however, he was D/C from this practice.

## 2016-04-06 NOTE — Telephone Encounter (Signed)
-----   Message from Milus Banister, MD sent at 04/02/2016  9:12 AM EST ----- Bruce Franklin,  He needs repeat ERCP in 4-5 weeks at [facility] to remove existing stent, removed remaining CBD stones.  Tell end that I'll want spyglass available with rep to possible contact lithotrispy during the case.  He's going home today or tomorrow.    Thanks

## 2016-04-12 NOTE — Telephone Encounter (Signed)
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    Can you please refer him to [place] for extensive choledocholithiasis that was partially treated with ERCP, sphincterotomy, stone removal and bile duct stenting.  Given the extensive stone burden he is probably best served by biliary endoscopist with more expertise anyway.  Thanks

## 2016-05-03 NOTE — Telephone Encounter (Signed)
Yes, see my response on this phone note from 04/12/16 about referring him to [place]

## 2016-05-03 NOTE — Telephone Encounter (Signed)
Message left for pt to contact [facility] for follow up

## 2016-05-03 NOTE — Telephone Encounter (Signed)
Dr Ardis Hughs did you see my note on this pt, he has been discharged from this practice and you asked for repeat ERCP. Please advise

## 2017-07-18 DIAGNOSIS — J440 Chronic obstructive pulmonary disease with acute lower respiratory infection: Secondary | ICD-10-CM | POA: Diagnosis not present

## 2017-07-18 DIAGNOSIS — F419 Anxiety disorder, unspecified: Secondary | ICD-10-CM | POA: Diagnosis not present

## 2017-07-18 DIAGNOSIS — G8929 Other chronic pain: Secondary | ICD-10-CM | POA: Diagnosis not present

## 2017-07-18 DIAGNOSIS — J181 Lobar pneumonia, unspecified organism: Secondary | ICD-10-CM | POA: Diagnosis not present

## 2017-07-18 DIAGNOSIS — K8050 Calculus of bile duct without cholangitis or cholecystitis without obstruction: Secondary | ICD-10-CM | POA: Diagnosis not present

## 2017-07-18 DIAGNOSIS — E1165 Type 2 diabetes mellitus with hyperglycemia: Secondary | ICD-10-CM | POA: Diagnosis not present

## 2017-07-18 DIAGNOSIS — J9691 Respiratory failure, unspecified with hypoxia: Secondary | ICD-10-CM | POA: Diagnosis not present

## 2017-07-19 DIAGNOSIS — J440 Chronic obstructive pulmonary disease with acute lower respiratory infection: Secondary | ICD-10-CM | POA: Diagnosis not present

## 2017-07-19 DIAGNOSIS — J9691 Respiratory failure, unspecified with hypoxia: Secondary | ICD-10-CM | POA: Diagnosis not present

## 2017-07-19 DIAGNOSIS — E1165 Type 2 diabetes mellitus with hyperglycemia: Secondary | ICD-10-CM | POA: Diagnosis not present

## 2017-07-19 DIAGNOSIS — K8050 Calculus of bile duct without cholangitis or cholecystitis without obstruction: Secondary | ICD-10-CM | POA: Diagnosis not present

## 2017-07-19 DIAGNOSIS — F419 Anxiety disorder, unspecified: Secondary | ICD-10-CM | POA: Diagnosis not present

## 2017-07-19 DIAGNOSIS — G8929 Other chronic pain: Secondary | ICD-10-CM | POA: Diagnosis not present

## 2017-07-19 DIAGNOSIS — J181 Lobar pneumonia, unspecified organism: Secondary | ICD-10-CM | POA: Diagnosis not present

## 2017-07-20 DIAGNOSIS — F419 Anxiety disorder, unspecified: Secondary | ICD-10-CM | POA: Diagnosis not present

## 2017-07-20 DIAGNOSIS — K8050 Calculus of bile duct without cholangitis or cholecystitis without obstruction: Secondary | ICD-10-CM | POA: Diagnosis not present

## 2017-07-20 DIAGNOSIS — J440 Chronic obstructive pulmonary disease with acute lower respiratory infection: Secondary | ICD-10-CM | POA: Diagnosis not present

## 2017-07-20 DIAGNOSIS — E1165 Type 2 diabetes mellitus with hyperglycemia: Secondary | ICD-10-CM | POA: Diagnosis not present

## 2017-07-20 DIAGNOSIS — J181 Lobar pneumonia, unspecified organism: Secondary | ICD-10-CM | POA: Diagnosis not present

## 2017-07-20 DIAGNOSIS — G8929 Other chronic pain: Secondary | ICD-10-CM | POA: Diagnosis not present

## 2017-07-20 DIAGNOSIS — J9691 Respiratory failure, unspecified with hypoxia: Secondary | ICD-10-CM | POA: Diagnosis not present

## 2017-12-25 ENCOUNTER — Emergency Department (HOSPITAL_COMMUNITY): Payer: Medicare HMO

## 2017-12-25 ENCOUNTER — Encounter (HOSPITAL_COMMUNITY): Payer: Self-pay | Admitting: Emergency Medicine

## 2017-12-25 ENCOUNTER — Emergency Department (HOSPITAL_COMMUNITY)
Admission: EM | Admit: 2017-12-25 | Discharge: 2017-12-25 | Disposition: A | Discharge status: Home / Self Care | Payer: Medicare HMO | Attending: Emergency Medicine | Admitting: Emergency Medicine

## 2017-12-25 ENCOUNTER — Other Ambulatory Visit: Payer: Self-pay

## 2017-12-25 DIAGNOSIS — W19XXXA Unspecified fall, initial encounter: Secondary | ICD-10-CM

## 2017-12-25 DIAGNOSIS — W0110XA Fall on same level from slipping, tripping and stumbling with subsequent striking against unspecified object, initial encounter: Secondary | ICD-10-CM | POA: Insufficient documentation

## 2017-12-25 DIAGNOSIS — J181 Lobar pneumonia, unspecified organism: Secondary | ICD-10-CM | POA: Diagnosis not present

## 2017-12-25 DIAGNOSIS — Y998 Other external cause status: Secondary | ICD-10-CM | POA: Diagnosis not present

## 2017-12-25 DIAGNOSIS — Z8546 Personal history of malignant neoplasm of prostate: Secondary | ICD-10-CM | POA: Diagnosis not present

## 2017-12-25 DIAGNOSIS — J189 Pneumonia, unspecified organism: Secondary | ICD-10-CM

## 2017-12-25 DIAGNOSIS — E119 Type 2 diabetes mellitus without complications: Secondary | ICD-10-CM | POA: Insufficient documentation

## 2017-12-25 DIAGNOSIS — S299XXA Unspecified injury of thorax, initial encounter: Secondary | ICD-10-CM | POA: Diagnosis present

## 2017-12-25 DIAGNOSIS — Y939 Activity, unspecified: Secondary | ICD-10-CM | POA: Insufficient documentation

## 2017-12-25 DIAGNOSIS — Y929 Unspecified place or not applicable: Secondary | ICD-10-CM | POA: Diagnosis not present

## 2017-12-25 DIAGNOSIS — J449 Chronic obstructive pulmonary disease, unspecified: Secondary | ICD-10-CM | POA: Insufficient documentation

## 2017-12-25 DIAGNOSIS — S2231XA Fracture of one rib, right side, initial encounter for closed fracture: Secondary | ICD-10-CM | POA: Diagnosis not present

## 2017-12-25 DIAGNOSIS — F17210 Nicotine dependence, cigarettes, uncomplicated: Secondary | ICD-10-CM | POA: Insufficient documentation

## 2017-12-25 LAB — BASIC METABOLIC PANEL
Anion gap: 13 (ref 5–15)
BUN: 18 mg/dL (ref 8–23)
CALCIUM: 9 mg/dL (ref 8.9–10.3)
CHLORIDE: 100 mmol/L (ref 98–111)
CO2: 22 mmol/L (ref 22–32)
CREATININE: 0.94 mg/dL (ref 0.61–1.24)
GFR calc non Af Amer: >60 mL/min (ref >60)
GLUCOSE: 220 mg/dL — AB (ref 70–99)
Potassium: 4.2 mmol/L (ref 3.5–5.1)
Sodium: 135 mmol/L (ref 135–145)

## 2017-12-25 LAB — CBC
HCT: 44.9 % (ref 39.0–52.0)
Hemoglobin: 14.9 g/dL (ref 13.0–17.0)
MCH: 31.2 pg (ref 26.0–34.0)
MCHC: 33.2 g/dL (ref 30.0–36.0)
MCV: 93.9 fL (ref 78.0–100.0)
PLATELETS: 305 10*3/uL (ref 150–400)
RBC: 4.78 MIL/uL (ref 4.22–5.81)
RDW: 12.7 % (ref 11.5–15.5)
WBC: 13.2 10*3/uL — ABNORMAL HIGH (ref 4.0–10.5)

## 2017-12-25 LAB — CBG MONITORING, ED: GLUCOSE-CAPILLARY: 190 mg/dL — AB (ref 70–99)

## 2017-12-25 MED ORDER — LEVOFLOXACIN 750 MG PO TABS: 750.0000 mg | ORAL_TABLET | Freq: Every day | ORAL | 0 refills | Status: AC

## 2017-12-25 MED ORDER — LEVOFLOXACIN 750 MG PO TABS
750.0000 mg | ORAL_TABLET | Freq: Once | ORAL | Status: AC
  Administered 2017-12-25: 750 mg via ORAL
  Filled 2017-12-25: qty 1

## 2017-12-25 MED ORDER — OXYCODONE-ACETAMINOPHEN 5-325 MG PO TABS
2.0000 | ORAL_TABLET | Freq: Once | ORAL | Status: AC
  Administered 2017-12-25: 2 via ORAL
  Filled 2017-12-25: qty 2

## 2017-12-25 MED ORDER — OXYCODONE-ACETAMINOPHEN 5-325 MG PO TABS
1.0000 | ORAL_TABLET | Freq: Once | ORAL | Status: AC
  Administered 2017-12-25: 1 via ORAL
  Filled 2017-12-25: qty 1

## 2017-12-25 MED ORDER — LIDOCAINE 5 % EX PTCH: 1.0000 | MEDICATED_PATCH | CUTANEOUS | 0 refills | Status: DC

## 2017-12-25 MED ORDER — LIDOCAINE 5 % EX PTCH
1.0000 | MEDICATED_PATCH | CUTANEOUS | Status: DC
  Administered 2017-12-25: 1 via TRANSDERMAL
  Filled 2017-12-25: qty 1

## 2017-12-25 NOTE — ED Notes (Signed)
Patient is resting comfortably. 

## 2017-12-25 NOTE — ED Provider Notes (Signed)
Emergency Department Provider Note   I have reviewed the triage vital signs and the nursing notes.   HISTORY  Chief Complaint Fall (rib injury)   HPI Bruce Franklin is a 72 y.o. male with PMH of DM, COPD, tobacco use, and h/o prostate cancer presents to the emergency department for evaluation of right lateral chest wall pain after fall.  Patient fell 3 days ago.  He states he became lightheaded and when turning to try and sit down he fell striking his right lateral chest when falling.  He has had pain in that area but has been severe and constant.  Worse with breathing.  He is experiencing some slight productive cough but denies fevers.  With continued symptoms he presents to the emergency department.  He has been trying Percocet at home with only mild relief. No radiation of symptoms.    Past Medical History:  Diagnosis Date  . Acute biliary pancreatitis   . Anxiety   . Cholelithiasis   . COPD (chronic obstructive pulmonary disease) ([place])   . Diabetes mellitus without complication ([place])   . Pneumonia   . Prostate cancer ([place])   . Smokes 1 pack of cigarettes per day     Patient Active Problem List   Diagnosis Date Noted  . Cholelithiases 04/02/2016  . Bile duct stone 03/30/2016  . Elevated transaminase level 03/30/2016  . Hyponatremia 03/30/2016  . Hyperkalemia 03/30/2016  . Acute biliary pancreatitis 03/30/2016  . COPD (chronic obstructive pulmonary disease) ([place])   . Diabetes mellitus without complication ([place])   . Prostate cancer ([place])   . Hyperglycemia   . Abscess of upper back excluding scapular region   . Type 2 diabetes mellitus without complication ([place])   . Malnutrition of moderate degree ([place]) 11/15/2014  . Sepsis affecting skin 11/14/2014  . Sepsis ([place]) 11/14/2014  . TOBACCO DEPENDENCE 06/09/2006  . NEPHROLITHIASIS 06/09/2006  . BACK PAIN, LOW 06/09/2006    Past Surgical History:  Procedure Laterality Date  . CHOLECYSTECTOMY N/A 04/01/2016   Procedure: LAPAROSCOPIC CHOLECYSTECTOMY;  Surgeon: Clovis Riley, MD;  Location: [facility];  Service: General;  Laterality: N/A;  . ERCP N/A 03/30/2016   Procedure: ENDOSCOPIC RETROGRADE CHOLANGIOPANCREATOGRAPHY (ERCP);  Surgeon: Milus Banister, MD;  Location: [facility];  Service: Endoscopy;  Laterality: N/A;  . LUMBAR FUSION    . PROSTATE SURGERY    . ROTATOR CUFF REPAIR      Allergies Ibuprofen  Family History  Problem Relation Age of Onset  . Heart attack Mother   . COPD Father     Social History Social History   Tobacco Use  . Smoking status: Current Every Day Smoker    Packs/day: 1.00    Years: 52.00    Pack years: 52.00    Types: Cigarettes  Substance Use Topics  . Alcohol use: No  . Drug use: No    Review of Systems  Constitutional: No fever/chills Eyes: No visual changes. ENT: No sore throat. Cardiovascular: Positive right lateral chest wall pain.  Respiratory: Denies shortness of breath. Positive productive cough.  Gastrointestinal: No abdominal pain.  No nausea, no vomiting.  No diarrhea.  No constipation. Genitourinary: Negative for dysuria. Musculoskeletal: Negative for back pain. Skin: Negative for rash. Neurological: Negative for headaches, focal weakness or numbness.  10-point ROS otherwise negative.  ____________________________________________   PHYSICAL EXAM:  VITAL SIGNS: ED Triage Vitals  Enc Vitals Group     BP 12/25/17 1510 (!) 128/51     Pulse Rate  12/25/17 1510 (!) 118     Resp 12/25/17 1510 12     Temp 12/25/17 1510 98 F (36.7 C)     Temp Source 12/25/17 1510 Oral     SpO2 --      Weight 12/25/17 1511 160 lb (72.6 kg)     Height 12/25/17 1511 6\' 3"  (1.905 m)     Pain Score 12/25/17 1514 10   Constitutional: Alert and oriented. Well appearing and in no acute distress. Eyes: Conjunctivae are normal.  Head: Atraumatic. Nose: No congestion/rhinnorhea. Mouth/Throat: Mucous membranes are moist.  Neck: No stridor. No cervical  spine tenderness to palpation. Cardiovascular: Normal rate, regular rhythm. Good peripheral circulation. Grossly normal heart sounds.   Respiratory: Normal respiratory effort.  No retractions. Lungs CTAB. Gastrointestinal: Soft and nontender. No distention.  Musculoskeletal: No lower extremity tenderness nor edema. No gross deformities of extremities. Positive right lateral chest wall.  Neurologic:  Normal speech and language. No gross focal neurologic deficits are appreciated.  Skin:  Skin is warm, dry and intact. No rash noted.  ____________________________________________   LABS (all labs ordered are listed, but only abnormal results are displayed)  Labs Reviewed  BASIC METABOLIC PANEL - Abnormal; Notable for the following components:      Result Value   Glucose, Bld 220 (*)    All other components within normal limits  CBC - Abnormal; Notable for the following components:   WBC 13.2 (*)    All other components within normal limits  CBG MONITORING, ED - Abnormal; Notable for the following components:   Glucose-Capillary 190 (*)    All other components within normal limits   ____________________________________________  EKG   EKG Interpretation  Date/Time:  Sunday December 25 2017 15:56:30 EDT Ventricular Rate:  100 PR Interval:  142 QRS Duration: 104 QT Interval:  360 QTC Calculation: 464 R Axis:   87 Text Interpretation:  Normal sinus rhythm Normal ECG No STEMI.  Confirmed by Nanda Quinton [phone]) on 12/25/2017 4:00:06 PM       ____________________________________________  RADIOLOGY  Dg Ribs Unilateral W/chest Right  Result Date: 12/25/2017 CLINICAL DATA:  Fall last week with right lower anterior rib pain. EXAM: RIGHT RIBS AND CHEST - 3+ VIEW COMPARISON:  Chest x-ray 07/18/2017 and chest CT 07/18/2017 FINDINGS: Lungs are adequately inflated with right base opacification likely atelectasis although infection is possible. Possible small amount right pleural fluid. No  pneumothorax. Left lung is clear. Cardiomediastinal silhouette is within normal. Evidence of a subtle anterior right seventh rib fracture. IMPRESSION: Findings suggesting a subtle anterior right seventh rib fracture. Associated opacification of the right base likely atelectasis with possible small amount of pleural fluid. No pneumothorax. Infection in the right base is possible. Electronically Signed   By: Marin Olp M.D.   On: 12/25/2017 17:20    ____________________________________________   PROCEDURES  Procedure(s) performed:   Procedures  None ____________________________________________   INITIAL IMPRESSION / ASSESSMENT AND PLAN / ED COURSE  Pertinent labs & imaging results that were available during my care of the patient were reviewed by me and considered in my medical decision making (see chart for details).  Patient presents to the emergency department for evaluation after fall 3 days ago.  He has tenderness over the right lateral chest wall.  No bruising, crepitus, or paradoxical movement.  Patient is afebrile.  He is somewhat tachycardic on arrival which I suspect is related to pain.  Very low suspicion for PE as the patient is exquisitely tender to  palpation of the right chest.  Went for chest x-ray, labs, EKG with the patient feeling lightheaded prior to fall.  5:52 PM Patient chest x-ray shows concern for possible rib fracture on the right.  This correlates well clinically.  Also has some possible developing pneumonia near this area.  Patient has had some productive cough but no fevers or chills.  No hypoxemia here.  His pain is reasonably well controlled with Lidoderm patch and Percocet which he has at home.  I offered the patient admission for antibiotics, chest PT, pain control.  He states he prefer to manage this at home if possible with oral antibiotics.  He has a primary care physician appointment scheduled for later this week and lives with family.  I told him I think  this is reasonable but if he worsens at all he will need to return to the emergency department immediately.  Provided an incentive spirometer. Family at bedside for conversation.  ____________________________________________  FINAL CLINICAL IMPRESSION(S) / ED DIAGNOSES  Final diagnoses:  Closed fracture of one rib of right side, initial encounter  Community acquired pneumonia of right lower lobe of lung ([place])  Fall, initial encounter     MEDICATIONS GIVEN DURING THIS VISIT:  Medications  lidocaine (LIDODERM) 5 % 1 patch (1 patch Transdermal Patch Applied 12/25/17 1601)  levofloxacin (LEVAQUIN) tablet 750 mg (has no administration in time range)  oxyCODONE-acetaminophen (PERCOCET/ROXICET) 5-325 MG per tablet 2 tablet (2 tablets Oral Given 12/25/17 1600)     NEW OUTPATIENT MEDICATIONS STARTED DURING THIS VISIT:  New Prescriptions   LEVOFLOXACIN (LEVAQUIN) 750 MG TABLET    Take 1 tablet (750 mg total) by mouth daily for 4 days.   LIDOCAINE (LIDODERM) 5 %    Place 1 patch onto the skin daily. Remove & Discard patch within 12 hours or as directed by MD    Note:  This document was prepared using Dragon voice recognition software and may include unintentional dictation errors.  Nanda Quinton, MD Emergency Medicine    Sanii Kukla, [facility], MD 12/26/17 [phone]

## 2017-12-25 NOTE — Discharge Instructions (Signed)
Your workup today showed that you have a fracture to one or more ribs.  Unfortunately this type of injury hurts but there is no way to fix it immediately; it must heal over time.  Be sure to take plenty of deep breaths so that you get rid of the "bad air" in your lungs.  If you are given a device called an incentive spirometer, please use it as recommended.  Unless you have been told by your doctor not to do so, we recommend you take ibuprofen 600 mg 3 times daily with meals for no more than 5 days.  You can also take Tylenol 1000 mg every 6 hours for pain.  Follow-up at the clinics or with the doctors described in this paperwork.  Return to the emergency department if he develop new or worsening symptoms that concern you.   Rib Fracture A rib fracture is a break or crack in one of the bones of the ribs. The ribs are a group of Korver Graybeal, curved bones that wrap around your chest and attach to your spine. They protect your lungs and other organs in the chest cavity. A broken or cracked rib is often painful, but most do not cause other problems. Most rib fractures heal on their own over time. However, rib fractures can be more serious if multiple ribs are broken or if broken ribs move out of place and push against other structures. CAUSES  A direct blow to the chest. For example, this could happen during contact sports, a car accident, or a fall against a hard object. Repetitive movements with high force, such as pitching a baseball or having severe coughing spells. SYMPTOMS  Pain when you breathe in or cough. Pain when someone presses on the injured area. DIAGNOSIS  Your caregiver will perform a physical exam. Various imaging tests may be ordered to confirm the diagnosis and to look for related injuries. These tests may include a chest X-ray, computed tomography (CT), magnetic resonance imaging (MRI), or a bone scan. TREATMENT  Rib fractures usually heal on their own in 1-3 months. The longer healing  period is often associated with a continued cough or other aggravating activities. During the healing period, pain control is very important. Medication is usually given to control pain. Hospitalization or surgery may be needed for more severe injuries, such as those in which multiple ribs are broken or the ribs have moved out of place.  HOME CARE INSTRUCTIONS  Avoid strenuous activity and any activities or movements that cause pain. Be careful during activities and avoid bumping the injured rib. Gradually increase activity as directed by your caregiver. Only take over-the-counter or prescription medications as directed by your caregiver. Do not take other medications without asking your caregiver first. Apply ice to the injured area for the first 1-2 days after you have been treated or as directed by your caregiver. Applying ice helps to reduce inflammation and pain. Put ice in a plastic bag. Place a towel between your skin and the bag.   Leave the ice on for 15-20 minutes at a time, every 2 hours while you are awake. Perform deep breathing as directed by your caregiver. This will help prevent pneumonia, which is a common complication of a broken rib. Your caregiver may instruct you to: Take deep breaths several times a day. Try to cough several times a day, holding a pillow against the injured area. Use a device called an incentive spirometer to practice deep breathing several times a day. Drink   enough fluids to keep your urine clear or pale yellow. This will help you avoid constipation.   Do not wear a rib belt or binder. These restrict breathing, which can lead to pneumonia.   SEEK IMMEDIATE MEDICAL CARE IF:  You have a fever.   You have difficulty breathing or shortness of breath.   You develop a continual cough, or you cough up thick or bloody sputum. You feel sick to your stomach (nausea), throw up (vomit), or have abdominal pain.   You have worsening pain not controlled with medications.    MAKE SURE YOU: Understand these instructions. Will watch your condition. Will get help right away if you are not doing well or get worse. Document Released: 03/29/2005 Document Revised: 11/29/2012 Document Reviewed: 05/31/2012 ExitCare Patient Information 2015 [employer]. This information is not intended to replace advice given to you by your health care provider. Make sure you discuss any questions you have with your health care provider.   

## 2017-12-25 NOTE — ED Notes (Signed)
Discharge paperwork reviewed with pt and family at bedside. Instructed on use of incentive spirometer.

## 2017-12-25 NOTE — ED Triage Notes (Signed)
Pt reports falling last Friday, states that he has had right rib pain since he fell, now having a congested cough. resp e/u, nad.

## 2017-12-25 NOTE — ED Notes (Signed)
Patient transported to X-ray 

## 2018-04-04 MED ORDER — ATORVASTATIN CALCIUM 40 MG PO TABS
80.00 | ORAL_TABLET | ORAL | Status: DC
Start: 2018-04-04 — End: 2018-04-04

## 2018-04-04 MED ORDER — ALPRAZOLAM 0.5 MG PO TABS
1.00 | ORAL_TABLET | ORAL | Status: DC
Start: ? — End: 2018-04-04

## 2018-04-04 MED ORDER — METOPROLOL SUCCINATE ER 50 MG PO TB24
50.00 | ORAL_TABLET | ORAL | Status: DC
Start: 2018-04-05 — End: 2018-04-04

## 2018-04-04 MED ORDER — IPRATROPIUM BROMIDE 0.02 % IN SOLN
0.50 | RESPIRATORY_TRACT | Status: DC
Start: ? — End: 2018-04-04

## 2018-04-04 MED ORDER — ACETAMINOPHEN 325 MG PO TABS
650.00 | ORAL_TABLET | ORAL | Status: DC
Start: ? — End: 2018-04-04

## 2018-04-04 MED ORDER — METFORMIN HCL ER 500 MG PO TB24
1000.00 | ORAL_TABLET | ORAL | Status: DC
Start: 2018-04-05 — End: 2018-04-04

## 2018-04-04 MED ORDER — FLUTICASONE FUROATE-VILANTEROL 200-25 MCG/INH IN AEPB
1.00 | INHALATION_SPRAY | RESPIRATORY_TRACT | Status: DC
Start: 2018-04-05 — End: 2018-04-04

## 2018-04-04 MED ORDER — ONDANSETRON HCL 4 MG/2ML IJ SOLN
4.00 | INTRAMUSCULAR | Status: DC
Start: ? — End: 2018-04-04

## 2018-04-04 MED ORDER — DOCUSATE SODIUM 100 MG PO CAPS
100.00 | ORAL_CAPSULE | ORAL | Status: DC
Start: 2018-04-04 — End: 2018-04-04

## 2018-04-04 MED ORDER — BISACODYL 5 MG PO TBEC
10.00 | DELAYED_RELEASE_TABLET | ORAL | Status: DC
Start: ? — End: 2018-04-04

## 2018-04-04 MED ORDER — OXYCODONE HCL 5 MG PO TABS
10.00 | ORAL_TABLET | ORAL | Status: DC
Start: ? — End: 2018-04-04

## 2018-04-04 MED ORDER — CLOPIDOGREL BISULFATE 75 MG PO TABS
75.00 | ORAL_TABLET | ORAL | Status: DC
Start: 2018-04-05 — End: 2018-04-04

## 2018-04-04 MED ORDER — ALBUTEROL SULFATE HFA 108 (90 BASE) MCG/ACT IN AERS
2.00 | INHALATION_SPRAY | RESPIRATORY_TRACT | Status: DC
Start: ? — End: 2018-04-04

## 2018-04-04 MED ORDER — NICOTINE 14 MG/24HR TD PT24
1.00 | MEDICATED_PATCH | TRANSDERMAL | Status: DC
Start: 2018-04-05 — End: 2018-04-04

## 2018-04-04 MED ORDER — INSULIN GLARGINE 100 UNIT/ML ~~LOC~~ SOLN
15.00 | SUBCUTANEOUS | Status: DC
Start: 2018-04-05 — End: 2018-04-04

## 2018-04-04 MED ORDER — FUROSEMIDE 20 MG PO TABS
20.00 | ORAL_TABLET | ORAL | Status: DC
Start: 2018-04-05 — End: 2018-04-04

## 2018-04-04 MED ORDER — BISACODYL 10 MG RE SUPP
10.00 | RECTAL | Status: DC
Start: ? — End: 2018-04-04

## 2018-04-04 MED ORDER — RIVAROXABAN 20 MG PO TABS
20.00 | ORAL_TABLET | ORAL | Status: DC
Start: 2018-04-04 — End: 2018-04-04

## 2018-04-04 MED ORDER — LISINOPRIL 5 MG PO TABS
5.00 | ORAL_TABLET | ORAL | Status: DC
Start: 2018-04-05 — End: 2018-04-04

## 2018-08-15 DIAGNOSIS — I10 Essential (primary) hypertension: Secondary | ICD-10-CM | POA: Insufficient documentation

## 2023-08-02 ENCOUNTER — Other Ambulatory Visit: Payer: Self-pay

## 2023-08-02 ENCOUNTER — Emergency Department (HOSPITAL_COMMUNITY)

## 2023-08-02 ENCOUNTER — Inpatient Hospital Stay (HOSPITAL_COMMUNITY)
Admission: EM | Admit: 2023-08-02 | Discharge: 2023-08-12 | DRG: 871 | Disposition: A | Discharge status: Skilled Nursing Facility | Attending: Internal Medicine | Admitting: Internal Medicine

## 2023-08-02 DIAGNOSIS — L89151 Pressure ulcer of sacral region, stage 1: Secondary | ICD-10-CM | POA: Diagnosis present

## 2023-08-02 DIAGNOSIS — Z91199 Patient's noncompliance with other medical treatment and regimen due to unspecified reason: Secondary | ICD-10-CM

## 2023-08-02 DIAGNOSIS — F17210 Nicotine dependence, cigarettes, uncomplicated: Secondary | ICD-10-CM | POA: Diagnosis present

## 2023-08-02 DIAGNOSIS — J449 Chronic obstructive pulmonary disease, unspecified: Secondary | ICD-10-CM | POA: Diagnosis present

## 2023-08-02 DIAGNOSIS — I252 Old myocardial infarction: Secondary | ICD-10-CM

## 2023-08-02 DIAGNOSIS — Z7985 Long-term (current) use of injectable non-insulin antidiabetic drugs: Secondary | ICD-10-CM

## 2023-08-02 DIAGNOSIS — Z7984 Long term (current) use of oral hypoglycemic drugs: Secondary | ICD-10-CM

## 2023-08-02 DIAGNOSIS — Z886 Allergy status to analgesic agent status: Secondary | ICD-10-CM

## 2023-08-02 DIAGNOSIS — I5022 Chronic systolic (congestive) heart failure: Secondary | ICD-10-CM | POA: Diagnosis present

## 2023-08-02 DIAGNOSIS — Z79899 Other long term (current) drug therapy: Secondary | ICD-10-CM

## 2023-08-02 DIAGNOSIS — F419 Anxiety disorder, unspecified: Secondary | ICD-10-CM | POA: Diagnosis present

## 2023-08-02 DIAGNOSIS — Z825 Family history of asthma and other chronic lower respiratory diseases: Secondary | ICD-10-CM

## 2023-08-02 DIAGNOSIS — I2510 Atherosclerotic heart disease of native coronary artery without angina pectoris: Secondary | ICD-10-CM | POA: Diagnosis present

## 2023-08-02 DIAGNOSIS — Z751 Person awaiting admission to adequate facility elsewhere: Secondary | ICD-10-CM

## 2023-08-02 DIAGNOSIS — E861 Hypovolemia: Secondary | ICD-10-CM | POA: Diagnosis present

## 2023-08-02 DIAGNOSIS — A419 Sepsis, unspecified organism: Principal | ICD-10-CM | POA: Diagnosis present

## 2023-08-02 DIAGNOSIS — E872 Acidosis, unspecified: Secondary | ICD-10-CM | POA: Diagnosis present

## 2023-08-02 DIAGNOSIS — J189 Pneumonia, unspecified organism: Principal | ICD-10-CM | POA: Diagnosis present

## 2023-08-02 DIAGNOSIS — I110 Hypertensive heart disease with heart failure: Secondary | ICD-10-CM | POA: Diagnosis present

## 2023-08-02 DIAGNOSIS — Z882 Allergy status to sulfonamides status: Secondary | ICD-10-CM

## 2023-08-02 DIAGNOSIS — I4891 Unspecified atrial fibrillation: Secondary | ICD-10-CM | POA: Diagnosis not present

## 2023-08-02 DIAGNOSIS — Z79891 Long term (current) use of opiate analgesic: Secondary | ICD-10-CM

## 2023-08-02 DIAGNOSIS — Z91148 Patient's other noncompliance with medication regimen for other reason: Secondary | ICD-10-CM

## 2023-08-02 DIAGNOSIS — Z1152 Encounter for screening for COVID-19: Secondary | ICD-10-CM

## 2023-08-02 DIAGNOSIS — Z604 Social exclusion and rejection: Secondary | ICD-10-CM | POA: Diagnosis present

## 2023-08-02 DIAGNOSIS — Z7982 Long term (current) use of aspirin: Secondary | ICD-10-CM

## 2023-08-02 DIAGNOSIS — Z5982 Transportation insecurity: Secondary | ICD-10-CM

## 2023-08-02 DIAGNOSIS — R531 Weakness: Secondary | ICD-10-CM

## 2023-08-02 DIAGNOSIS — Z66 Do not resuscitate: Secondary | ICD-10-CM

## 2023-08-02 DIAGNOSIS — Z8249 Family history of ischemic heart disease and other diseases of the circulatory system: Secondary | ICD-10-CM

## 2023-08-02 DIAGNOSIS — Z8546 Personal history of malignant neoplasm of prostate: Secondary | ICD-10-CM

## 2023-08-02 DIAGNOSIS — I255 Ischemic cardiomyopathy: Secondary | ICD-10-CM | POA: Diagnosis present

## 2023-08-02 DIAGNOSIS — J9601 Acute respiratory failure with hypoxia: Secondary | ICD-10-CM | POA: Diagnosis present

## 2023-08-02 DIAGNOSIS — Z91128 Patient's intentional underdosing of medication regimen for other reason: Secondary | ICD-10-CM

## 2023-08-02 DIAGNOSIS — I480 Paroxysmal atrial fibrillation: Secondary | ICD-10-CM | POA: Diagnosis present

## 2023-08-02 DIAGNOSIS — Z7901 Long term (current) use of anticoagulants: Secondary | ICD-10-CM

## 2023-08-02 DIAGNOSIS — E785 Hyperlipidemia, unspecified: Secondary | ICD-10-CM | POA: Diagnosis present

## 2023-08-02 DIAGNOSIS — Z981 Arthrodesis status: Secondary | ICD-10-CM

## 2023-08-02 DIAGNOSIS — Z794 Long term (current) use of insulin: Secondary | ICD-10-CM

## 2023-08-02 DIAGNOSIS — L8990 Pressure ulcer of unspecified site, unspecified stage: Secondary | ICD-10-CM | POA: Insufficient documentation

## 2023-08-02 DIAGNOSIS — J690 Pneumonitis due to inhalation of food and vomit: Secondary | ICD-10-CM | POA: Diagnosis present

## 2023-08-02 DIAGNOSIS — G9340 Encephalopathy, unspecified: Secondary | ICD-10-CM | POA: Diagnosis present

## 2023-08-02 DIAGNOSIS — E119 Type 2 diabetes mellitus without complications: Secondary | ICD-10-CM

## 2023-08-02 DIAGNOSIS — R1312 Dysphagia, oropharyngeal phase: Secondary | ICD-10-CM

## 2023-08-02 LAB — COMPREHENSIVE METABOLIC PANEL WITH GFR
ALT: 15 U/L (ref 0–44)
AST: 17 U/L (ref 15–41)
Albumin: 3.7 g/dL (ref 3.5–5.0)
Alkaline Phosphatase: 41 U/L (ref 38–126)
Anion gap: 13 (ref 5–15)
BUN: 14 mg/dL (ref 8–23)
CO2: 21 mmol/L — ABNORMAL LOW (ref 22–32)
Calcium: 9.7 mg/dL (ref 8.9–10.3)
Chloride: 100 mmol/L (ref 98–111)
Creatinine, Ser: 0.97 mg/dL (ref 0.61–1.24)
GFR, Estimated: >60 mL/min (ref >60)
Glucose, Bld: 333 mg/dL — ABNORMAL HIGH (ref 70–99)
Potassium: 4.4 mmol/L (ref 3.5–5.1)
Sodium: 134 mmol/L — ABNORMAL LOW (ref 135–145)
Total Bilirubin: 1.2 mg/dL (ref 0.0–1.2)
Total Protein: 7.3 g/dL (ref 6.5–8.1)

## 2023-08-02 LAB — CBC WITH DIFFERENTIAL/PLATELET
Abs Immature Granulocytes: 0.06 10*3/uL (ref 0.00–0.07)
Basophils Absolute: 0.1 10*3/uL (ref 0.0–0.1)
Basophils Relative: 0 %
Eosinophils Absolute: 0 10*3/uL (ref 0.0–0.5)
Eosinophils Relative: 0 %
HCT: 47.9 % (ref 39.0–52.0)
Hemoglobin: 16.5 g/dL (ref 13.0–17.0)
Immature Granulocytes: 0 %
Lymphocytes Relative: 3 %
Lymphs Abs: 0.5 10*3/uL — ABNORMAL LOW (ref 0.7–4.0)
MCH: 31.3 pg (ref 26.0–34.0)
MCHC: 34.4 g/dL (ref 30.0–36.0)
MCV: 90.7 fL (ref 80.0–100.0)
Monocytes Absolute: 0.9 10*3/uL (ref 0.1–1.0)
Monocytes Relative: 6 %
Neutro Abs: 14.2 10*3/uL — ABNORMAL HIGH (ref 1.7–7.7)
Neutrophils Relative %: 91 %
Platelets: 214 10*3/uL (ref 150–400)
RBC: 5.28 MIL/uL (ref 4.22–5.81)
RDW: 13.2 % (ref 11.5–15.5)
WBC: 15.7 10*3/uL — ABNORMAL HIGH (ref 4.0–10.5)
nRBC: 0 % (ref 0.0–0.2)

## 2023-08-02 LAB — LACTIC ACID, PLASMA: Lactic Acid, Venous: 2.1 mmol/L (ref 0.5–1.9)

## 2023-08-02 LAB — CBG MONITORING, ED: Glucose-Capillary: 370 mg/dL — ABNORMAL HIGH (ref 70–99)

## 2023-08-02 NOTE — ED Notes (Signed)
 PA Edwina Gram requested for [facility]

## 2023-08-02 NOTE — ED Provider Triage Note (Signed)
 Emergency Medicine Provider Triage Evaluation Note  Bruce Franklin , a 78 y.o. male  was evaluated in triage.  Pt complains of increased weakness, confusion, productive cough.    Review of Systems  Positive: Cough, confusion Negative: vomiting  Physical Exam  BP 114/84 (BP Location: Right Arm)   Pulse (!) 126   Temp 99.1 F (37.3 C)   Resp (!) 24   SpO2 90%  Gen:   Awake, no distress   Resp:  Normal effort  MSK:   Moves extremities without difficulty  Other:  Wet cough on exam  Medical Decision Making  Medically screening exam initiated at 11:37 PM.  Appropriate orders placed.  Yuriy Cui Giraud was informed that the remainder of the evaluation will be completed by another provider, this initial triage assessment does not replace that evaluation, and the importance of remaining in the ED until their evaluation is complete.  Meets sepsis criteria.  Work-up pursued.  CXR appears to have PNA.  Anticipate admission.     Coretha Dew, PA-C 08/02/23 2340

## 2023-08-02 NOTE — ED Triage Notes (Signed)
 Pt BIB wife Dorris d/t concern for increase weakness that started today. Weakness is associated with hyperglycemia in the 200s, productive cough, and increased confusion.

## 2023-08-03 ENCOUNTER — Encounter (HOSPITAL_COMMUNITY): Payer: Self-pay | Admitting: Family Medicine

## 2023-08-03 DIAGNOSIS — I2510 Atherosclerotic heart disease of native coronary artery without angina pectoris: Secondary | ICD-10-CM | POA: Diagnosis present

## 2023-08-03 DIAGNOSIS — L89151 Pressure ulcer of sacral region, stage 1: Secondary | ICD-10-CM | POA: Diagnosis present

## 2023-08-03 DIAGNOSIS — I110 Hypertensive heart disease with heart failure: Secondary | ICD-10-CM | POA: Diagnosis present

## 2023-08-03 DIAGNOSIS — I5022 Chronic systolic (congestive) heart failure: Secondary | ICD-10-CM | POA: Diagnosis present

## 2023-08-03 DIAGNOSIS — F17210 Nicotine dependence, cigarettes, uncomplicated: Secondary | ICD-10-CM | POA: Diagnosis present

## 2023-08-03 DIAGNOSIS — I4891 Unspecified atrial fibrillation: Secondary | ICD-10-CM | POA: Diagnosis present

## 2023-08-03 DIAGNOSIS — J410 Simple chronic bronchitis: Secondary | ICD-10-CM | POA: Diagnosis not present

## 2023-08-03 DIAGNOSIS — Z79891 Long term (current) use of opiate analgesic: Secondary | ICD-10-CM

## 2023-08-03 DIAGNOSIS — J9601 Acute respiratory failure with hypoxia: Secondary | ICD-10-CM | POA: Diagnosis present

## 2023-08-03 DIAGNOSIS — I255 Ischemic cardiomyopathy: Secondary | ICD-10-CM | POA: Diagnosis present

## 2023-08-03 DIAGNOSIS — E119 Type 2 diabetes mellitus without complications: Secondary | ICD-10-CM | POA: Diagnosis present

## 2023-08-03 DIAGNOSIS — A419 Sepsis, unspecified organism: Secondary | ICD-10-CM | POA: Diagnosis present

## 2023-08-03 DIAGNOSIS — R1312 Dysphagia, oropharyngeal phase: Secondary | ICD-10-CM | POA: Diagnosis present

## 2023-08-03 DIAGNOSIS — J690 Pneumonitis due to inhalation of food and vomit: Secondary | ICD-10-CM | POA: Diagnosis present

## 2023-08-03 DIAGNOSIS — E872 Acidosis, unspecified: Secondary | ICD-10-CM | POA: Diagnosis present

## 2023-08-03 DIAGNOSIS — F419 Anxiety disorder, unspecified: Secondary | ICD-10-CM | POA: Diagnosis present

## 2023-08-03 DIAGNOSIS — Z79899 Other long term (current) drug therapy: Secondary | ICD-10-CM | POA: Diagnosis not present

## 2023-08-03 DIAGNOSIS — Z1152 Encounter for screening for COVID-19: Secondary | ICD-10-CM | POA: Diagnosis not present

## 2023-08-03 DIAGNOSIS — Z66 Do not resuscitate: Secondary | ICD-10-CM | POA: Diagnosis present

## 2023-08-03 DIAGNOSIS — Z886 Allergy status to analgesic agent status: Secondary | ICD-10-CM | POA: Diagnosis not present

## 2023-08-03 DIAGNOSIS — I480 Paroxysmal atrial fibrillation: Secondary | ICD-10-CM | POA: Diagnosis present

## 2023-08-03 DIAGNOSIS — J189 Pneumonia, unspecified organism: Secondary | ICD-10-CM

## 2023-08-03 DIAGNOSIS — G9340 Encephalopathy, unspecified: Secondary | ICD-10-CM | POA: Diagnosis present

## 2023-08-03 DIAGNOSIS — E861 Hypovolemia: Secondary | ICD-10-CM | POA: Diagnosis present

## 2023-08-03 DIAGNOSIS — Z794 Long term (current) use of insulin: Secondary | ICD-10-CM | POA: Diagnosis not present

## 2023-08-03 DIAGNOSIS — E785 Hyperlipidemia, unspecified: Secondary | ICD-10-CM | POA: Diagnosis present

## 2023-08-03 DIAGNOSIS — J449 Chronic obstructive pulmonary disease, unspecified: Secondary | ICD-10-CM | POA: Diagnosis present

## 2023-08-03 DIAGNOSIS — I252 Old myocardial infarction: Secondary | ICD-10-CM | POA: Diagnosis not present

## 2023-08-03 LAB — URINALYSIS, W/ REFLEX TO CULTURE (INFECTION SUSPECTED)
Bacteria, UA: NONE SEEN
Bilirubin Urine: NEGATIVE
Glucose, UA: >=500 mg/dL — AB
Hgb urine dipstick: NEGATIVE
Ketones, ur: 5 mg/dL — AB
Leukocytes,Ua: NEGATIVE
Nitrite: NEGATIVE
Protein, ur: 30 mg/dL — AB
Specific Gravity, Urine: 1.01 (ref 1.005–1.030)
pH: 5 (ref 5.0–8.0)

## 2023-08-03 LAB — BASIC METABOLIC PANEL WITH GFR
Anion gap: 13 (ref 5–15)
BUN: 13 mg/dL (ref 8–23)
CO2: 22 mmol/L (ref 22–32)
Calcium: 9 mg/dL (ref 8.9–10.3)
Chloride: 103 mmol/L (ref 98–111)
Creatinine, Ser: 0.95 mg/dL (ref 0.61–1.24)
GFR, Estimated: >60 mL/min (ref >60)
Glucose, Bld: 294 mg/dL — ABNORMAL HIGH (ref 70–99)
Potassium: 4 mmol/L (ref 3.5–5.1)
Sodium: 138 mmol/L (ref 135–145)

## 2023-08-03 LAB — CBC
HCT: 43.6 % (ref 39.0–52.0)
Hemoglobin: 14.5 g/dL (ref 13.0–17.0)
MCH: 30.5 pg (ref 26.0–34.0)
MCHC: 33.3 g/dL (ref 30.0–36.0)
MCV: 91.6 fL (ref 80.0–100.0)
Platelets: 204 10*3/uL (ref 150–400)
RBC: 4.76 MIL/uL (ref 4.22–5.81)
RDW: 13.2 % (ref 11.5–15.5)
WBC: 11.9 10*3/uL — ABNORMAL HIGH (ref 4.0–10.5)
nRBC: 0 % (ref 0.0–0.2)

## 2023-08-03 LAB — RESP PANEL BY RT-PCR (RSV, FLU A&B, COVID)  RVPGX2
Influenza A by PCR: NEGATIVE
Influenza B by PCR: NEGATIVE
Resp Syncytial Virus by PCR: NEGATIVE
SARS Coronavirus 2 by RT PCR: NEGATIVE

## 2023-08-03 LAB — HEMOGLOBIN A1C
Hgb A1c MFr Bld: 9.4 % — ABNORMAL HIGH (ref 4.8–5.6)
Mean Plasma Glucose: 223.08 mg/dL

## 2023-08-03 LAB — APTT: aPTT: 26 s (ref 24–36)

## 2023-08-03 LAB — CBG MONITORING, ED
Glucose-Capillary: 192 mg/dL — ABNORMAL HIGH (ref 70–99)
Glucose-Capillary: 204 mg/dL — ABNORMAL HIGH (ref 70–99)
Glucose-Capillary: 213 mg/dL — ABNORMAL HIGH (ref 70–99)
Glucose-Capillary: 242 mg/dL — ABNORMAL HIGH (ref 70–99)
Glucose-Capillary: 292 mg/dL — ABNORMAL HIGH (ref 70–99)
Glucose-Capillary: 323 mg/dL — ABNORMAL HIGH (ref 70–99)

## 2023-08-03 LAB — LACTIC ACID, PLASMA
Lactic Acid, Venous: 3 mmol/L (ref 0.5–1.9)
Lactic Acid, Venous: 1.5 mmol/L (ref 0.5–1.9)

## 2023-08-03 LAB — STREP PNEUMONIAE URINARY ANTIGEN: Strep Pneumo Urinary Antigen: NEGATIVE

## 2023-08-03 MED ORDER — SODIUM CHLORIDE 0.9 % IV SOLN
1.0000 g | Freq: Once | INTRAVENOUS | Status: AC
  Administered 2023-08-03: 1 g via INTRAVENOUS
  Filled 2023-08-03: qty 10

## 2023-08-03 MED ORDER — IPRATROPIUM-ALBUTEROL 0.5-2.5 (3) MG/3ML IN SOLN: 3.0000 mL | RESPIRATORY_TRACT | Status: DC | PRN

## 2023-08-03 MED ORDER — SODIUM CHLORIDE 0.9 % IV SOLN
2.0000 g | INTRAVENOUS | Status: DC
  Administered 2023-08-03 – 2023-08-05 (×3): 2 g via INTRAVENOUS
  Filled 2023-08-03 (×3): qty 20

## 2023-08-03 MED ORDER — INSULIN ASPART 100 UNIT/ML IJ SOLN
0.0000 [IU] | Freq: Three times a day (TID) | INTRAMUSCULAR | Status: DC
  Administered 2023-08-03 – 2023-08-04 (×2): 5 [IU] via SUBCUTANEOUS
  Administered 2023-08-04 (×2): 3 [IU] via SUBCUTANEOUS
  Administered 2023-08-05: 2 [IU] via SUBCUTANEOUS
  Administered 2023-08-05: 4 [IU] via SUBCUTANEOUS

## 2023-08-03 MED ORDER — ONDANSETRON HCL 4 MG PO TABS: 4.0000 mg | ORAL_TABLET | Freq: Four times a day (QID) | ORAL | Status: DC | PRN

## 2023-08-03 MED ORDER — SENNOSIDES-DOCUSATE SODIUM 8.6-50 MG PO TABS: 1.0000 | ORAL_TABLET | Freq: Every evening | ORAL | Status: DC | PRN

## 2023-08-03 MED ORDER — ALPRAZOLAM 0.5 MG PO TABS
0.5000 mg | ORAL_TABLET | Freq: Three times a day (TID) | ORAL | Status: DC | PRN
  Administered 2023-08-03 – 2023-08-04 (×3): 0.5 mg via ORAL
  Filled 2023-08-03: qty 1
  Filled 2023-08-03 (×2): qty 2

## 2023-08-03 MED ORDER — ACETAMINOPHEN 325 MG PO TABS
650.0000 mg | ORAL_TABLET | Freq: Four times a day (QID) | ORAL | Status: DC | PRN
  Administered 2023-08-04 – 2023-08-07 (×4): 650 mg via ORAL
  Filled 2023-08-03 (×4): qty 2

## 2023-08-03 MED ORDER — DILTIAZEM HCL-DEXTROSE 125-5 MG/125ML-% IV SOLN (PREMIX)
5.0000 mg/h | INTRAVENOUS | Status: DC
  Administered 2023-08-03: 5 mg/h via INTRAVENOUS
  Administered 2023-08-04 (×2): 15 mg/h via INTRAVENOUS
  Filled 2023-08-03 (×4): qty 125

## 2023-08-03 MED ORDER — ACETAMINOPHEN 650 MG RE SUPP: 650.0000 mg | Freq: Four times a day (QID) | RECTAL | Status: DC | PRN

## 2023-08-03 MED ORDER — GUAIFENESIN 100 MG/5ML PO LIQD
5.0000 mL | ORAL | Status: DC | PRN
  Administered 2023-08-06: 5 mL via ORAL
  Filled 2023-08-03: qty 10

## 2023-08-03 MED ORDER — HEPARIN BOLUS VIA INFUSION
2000.0000 [IU] | Freq: Once | INTRAVENOUS | Status: AC
  Administered 2023-08-03: 2000 [IU] via INTRAVENOUS
  Filled 2023-08-03: qty 2000

## 2023-08-03 MED ORDER — SODIUM CHLORIDE 0.9 % IV BOLUS
500.0000 mL | Freq: Once | INTRAVENOUS | Status: AC
  Administered 2023-08-03: 500 mL via INTRAVENOUS

## 2023-08-03 MED ORDER — INSULIN ASPART 100 UNIT/ML IJ SOLN
0.0000 [IU] | INTRAMUSCULAR | Status: DC
  Administered 2023-08-03: 5 [IU] via SUBCUTANEOUS
  Administered 2023-08-03: 3 [IU] via SUBCUTANEOUS
  Administered 2023-08-03: 2 [IU] via SUBCUTANEOUS

## 2023-08-03 MED ORDER — INSULIN GLARGINE-YFGN 100 UNIT/ML ~~LOC~~ SOLN
5.0000 [IU] | Freq: Every day | SUBCUTANEOUS | Status: DC
  Administered 2023-08-03 – 2023-08-06 (×4): 5 [IU] via SUBCUTANEOUS
  Filled 2023-08-03 (×4): qty 0.05

## 2023-08-03 MED ORDER — SODIUM CHLORIDE 0.9 % IV SOLN
500.0000 mg | Freq: Once | INTRAVENOUS | Status: AC
  Administered 2023-08-03: 500 mg via INTRAVENOUS
  Filled 2023-08-03: qty 5

## 2023-08-03 MED ORDER — SODIUM CHLORIDE 0.9% FLUSH
3.0000 mL | Freq: Two times a day (BID) | INTRAVENOUS | Status: DC
  Administered 2023-08-03 – 2023-08-12 (×16): 3 mL via INTRAVENOUS

## 2023-08-03 MED ORDER — ENOXAPARIN SODIUM 40 MG/0.4ML IJ SOSY
40.0000 mg | PREFILLED_SYRINGE | Freq: Every day | INTRAMUSCULAR | Status: DC
  Administered 2023-08-03: 40 mg via SUBCUTANEOUS
  Filled 2023-08-03: qty 0.4

## 2023-08-03 MED ORDER — OXYCODONE HCL 5 MG PO TABS
10.0000 mg | ORAL_TABLET | Freq: Four times a day (QID) | ORAL | Status: DC | PRN
  Administered 2023-08-03 – 2023-08-04 (×3): 10 mg via ORAL
  Filled 2023-08-03 (×4): qty 2

## 2023-08-03 MED ORDER — HEPARIN (PORCINE) 25000 UT/250ML-% IV SOLN
1200.0000 [IU]/h | INTRAVENOUS | Status: DC
  Administered 2023-08-03: 1000 [IU]/h via INTRAVENOUS
  Filled 2023-08-03: qty 250

## 2023-08-03 MED ORDER — OXYCODONE HCL 5 MG PO TABS
10.0000 mg | ORAL_TABLET | Freq: Once | ORAL | Status: AC
  Administered 2023-08-03: 10 mg via ORAL
  Filled 2023-08-03: qty 2

## 2023-08-03 MED ORDER — AZITHROMYCIN 250 MG PO TABS
500.0000 mg | ORAL_TABLET | Freq: Every day | ORAL | Status: DC
  Administered 2023-08-03 – 2023-08-05 (×3): 500 mg via ORAL
  Filled 2023-08-03 (×3): qty 2

## 2023-08-03 MED ORDER — HALOPERIDOL LACTATE 5 MG/ML IJ SOLN
3.0000 mg | Freq: Once | INTRAMUSCULAR | Status: AC | PRN
  Administered 2023-08-03: 3 mg via INTRAVENOUS
  Filled 2023-08-03: qty 1

## 2023-08-03 MED ORDER — SODIUM CHLORIDE 0.9 % IV BOLUS
1000.0000 mL | Freq: Once | INTRAVENOUS | Status: AC
  Administered 2023-08-03: 1000 mL via INTRAVENOUS

## 2023-08-03 MED ORDER — ONDANSETRON HCL 4 MG/2ML IJ SOLN
4.0000 mg | Freq: Four times a day (QID) | INTRAMUSCULAR | Status: DC | PRN
Start: 2023-08-03 — End: 2023-08-12

## 2023-08-03 MED ORDER — INSULIN ASPART 100 UNIT/ML IJ SOLN
0.0000 [IU] | Freq: Every day | INTRAMUSCULAR | Status: DC
  Administered 2023-08-03: 2 [IU] via SUBCUTANEOUS

## 2023-08-03 MED ORDER — DILTIAZEM LOAD VIA INFUSION
10.0000 mg | Freq: Once | INTRAVENOUS | Status: AC
  Administered 2023-08-03: 10 mg via INTRAVENOUS
  Filled 2023-08-03: qty 10

## 2023-08-03 NOTE — Progress Notes (Signed)
 PROGRESS NOTE    Bruce Franklin  WUJ:[id number] DOB: 1945/10/10 DOA: 08/02/2023 PCP: [facility]  Subjective: Pt seen and examined. On RA currently. No distress. No family at bedside.   Hospital Course: HPI: Bruce Franklin is a 78 y.o. male with medical history significant for hypertension, hyperlipidemia, CAD, chronic HFmrEF, COPD, and diabetes mellitus who presents with productive cough, fatigue, and confusion.   Patient's wife has become concerned due to the patient's worsening fatigue, confusion, and productive cough.  Symptoms have been worsening over the course of several days.  No fevers have been noted at home but his blood sugar has been higher than usual.     ED Course: Upon arrival to the ED, patient is found to be afebrile and saturating 90% on room air with tachypnea, tachycardia, and stable BP.  Labs are most notable for glucosuria 33, WBC 15,700, lactic acid 2.1, and negative respiratory virus panel.  Chest x-ray is concerning for pneumonia.   Blood cultures were collected and the patient was given a liter of saline, Rocephin , and azithromycin .  Significant Events: Admitted 08/02/2023 for pneumonia   Significant Labs: WBC 15.7, HgB 16.5, plt 214 Lactic acid 2.1 Na 134, k 4.4, CO2 of 21, BUN 32, Scr 0.97, glu 333 Covid/flu/RSV negative   Significant Imaging Studies: CXR Emphysema. Mild ground-glass infiltrate in the left lung base, probable pneumonia. Imaging follow-up to resolution is recommended  Antibiotic Therapy: Anti-infectives (From admission, onward)    Start     Dose/Rate Route Frequency Ordered Stop   08/03/23 1800  azithromycin  (ZITHROMAX ) tablet 500 mg        500 mg Oral Daily 08/03/23 0349 08/07/23 0959   08/03/23 1000  cefTRIAXone  (ROCEPHIN ) 2 g in sodium chloride  0.9 % 100 mL IVPB        2 g 200 mL/hr over 30 Minutes Intravenous Every 24 hours 08/03/23 0349 08/07/23 0959   08/03/23 0015  cefTRIAXone  (ROCEPHIN ) 1 g in sodium chloride   0.9 % 100 mL IVPB        1 g 200 mL/hr over 30 Minutes Intravenous  Once 08/03/23 0005 08/03/23 0123   08/03/23 0015  azithromycin  (ZITHROMAX ) 500 mg in sodium chloride  0.9 % 250 mL IVPB        500 mg 250 mL/hr over 60 Minutes Intravenous  Once 08/03/23 0005 08/03/23 0156       Procedures:   Consultants:     Assessment and Plan: * Sepsis due to pneumonia (HCC) 08-03-2023 on RA. Sepsis presents on admission due to HR 126, WBC 15.7, lactic acid of 2.1. on IV Rocephin , po zithromax .  Repeat labs in AM. Possible DC to home in AM. PT consult.  Chronic prescription opiate use 08-03-2023 goes to [facility] for opiate Rx. Continue prn oxycodone  10 mg q6h prn.  Chronic heart failure with mildly reduced ejection fraction (HFmrEF, 41-49%) (HCC) 08-03-2023 not on GDMT as outpatient.   CAD (coronary artery disease) 08-03-2023 stable. Cont asa.  COPD (chronic obstructive pulmonary disease) (HCC) 08-03-2023 continue prn duonebs. No need for steroids. Not exacerbated.  Type 2 diabetes mellitus without complication (HCC) 08-03-2023 continue SSI. Change to Ac/hs. Add lantus  5 units daily.  Lab Results  Component Value Date   HGBA1C 9.4 (H) 08/03/2023   HGBA1C 11.2 (H) 03/30/2016   HGBA1C 12.0 (H) 11/15/2014     DVT prophylaxis: enoxaparin  (LOVENOX ) injection 40 mg Start: 08/03/23 1000    Code Status: Full Code Family Communication: no family at bedside Disposition  Plan: return home Reason for continuing need for hospitalization: repeat labs in AM. Potential for DC in AM.  Objective: Vitals:   08/03/23 1430 08/03/23 1445 08/03/23 1500 08/03/23 1515  BP:      Pulse:      Resp: (!) 21 20 (!) 23 19  Temp:      TempSrc:      SpO2:        Intake/Output Summary (Last 24 hours) at 08/03/2023 1617 Last data filed at 08/03/2023 0206 Gross per 24 hour  Intake 1350.71 ml  Output --  Net 1350.71 ml   There were no vitals filed for this  visit.  Examination:  Physical Exam Vitals and nursing note reviewed.  Constitutional:      General: He is not in acute distress.    Appearance: He is not toxic-appearing.     Comments: Chronically ill appearing  HENT:     Head: Normocephalic and atraumatic.     Nose: Nose normal.  Cardiovascular:     Rate and Rhythm: Normal rate and regular rhythm.  Pulmonary:     Effort: Pulmonary effort is normal. No respiratory distress.     Breath sounds: No wheezing.  Abdominal:     General: Bowel sounds are normal.     Palpations: Abdomen is soft.  Musculoskeletal:     Right lower leg: No edema.     Left lower leg: No edema.  Skin:    General: Skin is warm and dry.     Capillary Refill: Capillary refill takes less than 2 seconds.  Neurological:     Mental Status: He is oriented to person, place, and time.     Data Reviewed: I have personally reviewed following labs and imaging studies  CBC: Recent Labs  Lab 08/02/23 2254 08/03/23 0423  WBC 15.7* 11.9*  NEUTROABS 14.2*  --   HGB 16.5 14.5  HCT 47.9 43.6  MCV 90.7 91.6  PLT 214 204   Basic Metabolic Panel: Recent Labs  Lab 08/02/23 2254 08/03/23 0423  NA 134* 138  K 4.4 4.0  CL 100 103  CO2 21* 22  GLUCOSE 333* 294*  BUN 14 13  CREATININE 0.97 0.95  CALCIUM  9.7 9.0   GFR: CrCl cannot be calculated (Unknown ideal weight.). Liver Function Tests: Recent Labs  Lab 08/02/23 2254  AST 17  ALT 15  ALKPHOS 41  BILITOT 1.2  PROT 7.3  ALBUMIN 3.7   HbA1C: Recent Labs    08/03/23 0423  HGBA1C 9.4*   CBG: Recent Labs  Lab 08/02/23 2254 08/03/23 0036 08/03/23 0427 08/03/23 0819 08/03/23 1253  GLUCAP 370* 323* 292* 192* 242*   Sepsis Labs: Recent Labs  Lab 08/02/23 2254 08/03/23 0040 08/03/23 0820  LATICACIDVEN 2.1* 3.0* 1.5    Recent Results (from the past 240 hours)  Resp panel by RT-PCR (RSV, Flu A&B, Covid) Anterior Nasal Swab     Status: None   Collection Time: 08/02/23 10:40 PM    Specimen: Anterior Nasal Swab  Result Value Ref Range Status   SARS Coronavirus 2 by RT PCR NEGATIVE NEGATIVE Final   Influenza A by PCR NEGATIVE NEGATIVE Final   Influenza B by PCR NEGATIVE NEGATIVE Final    Comment: (NOTE) The Xpert Xpress SARS-CoV-2/FLU/RSV plus assay is intended as an aid in the diagnosis of influenza from Nasopharyngeal swab specimens and should not be used as a sole basis for treatment. Nasal washings and aspirates are unacceptable for Xpert Xpress SARS-CoV-2/FLU/RSV testing.  Fact Sheet  for Patients: BloggerCourse.com  Fact Sheet for Healthcare Providers: SeriousBroker.it  This test is not yet approved or cleared by the [place]  FDA and has been authorized for detection and/or diagnosis of SARS-CoV-2 by FDA under an Emergency Use Authorization (EUA). This EUA will remain in effect (meaning this test can be used) for the duration of the COVID-19 declaration under Section 564(b)(1) of the Act, 21 U.S.C. section 360bbb-3(b)(1), unless the authorization is terminated or revoked.     Resp Syncytial Virus by PCR NEGATIVE NEGATIVE Final    Comment: (NOTE) Fact Sheet for Patients: BloggerCourse.com  Fact Sheet for Healthcare Providers: SeriousBroker.it  This test is not yet approved or cleared by the [place]  FDA and has been authorized for detection and/or diagnosis of SARS-CoV-2 by FDA under an Emergency Use Authorization (EUA). This EUA will remain in effect (meaning this test can be used) for the duration of the COVID-19 declaration under Section 564(b)(1) of the Act, 21 U.S.C. section 360bbb-3(b)(1), unless the authorization is terminated or revoked.  Performed at [facility], [address] [place], [place] [address]      Radiology Studies: DG Chest Portable 1 View Result Date: 08/02/2023 CLINICAL DATA:  Weakness EXAM: PORTABLE  CHEST 1 VIEW COMPARISON:  07/25/2019 FINDINGS: Emphysema. Mild ground-glass infiltrate in the left lung base. No pleural effusion. Normal cardiac size. No pneumothorax IMPRESSION: Emphysema. Mild ground-glass infiltrate in the left lung base, probable pneumonia. Imaging follow-up to resolution is recommended Electronically Signed   By: Esmeralda Hedge M.D.   On: 08/02/2023 23:04    Scheduled Meds:  azithromycin   500 mg Oral Daily   enoxaparin  (LOVENOX ) injection  40 mg Subcutaneous Daily   insulin  aspart  0-5 Units Subcutaneous QHS   insulin  aspart  0-9 Units Subcutaneous TID WC   insulin  glargine-yfgn  5 Units Subcutaneous Daily   sodium chloride  flush  3 mL Intravenous Q12H   Continuous Infusions:  cefTRIAXone  (ROCEPHIN )  IV Stopped (08/03/23 1230)     LOS: 0 days   Time spent: 45 minutes  Unk Garb, DO  [facility]  08/03/2023, 4:17 PM

## 2023-08-03 NOTE — ED Notes (Signed)
 Board says pt is being monitored by CCMD despite no active order, however when called CCMD denied monitoring pt.

## 2023-08-03 NOTE — ED Notes (Addendum)
 MD Farrel Hones contacted for sustained afib Hr 130-170s, crash cart at bedisde. See new orders.

## 2023-08-03 NOTE — Assessment & Plan Note (Addendum)
 08-03-2023 goes to [facility] for opiate Rx. Continue prn oxycodone  10 mg q6h prn.  08-05-2023 stable. Due to excessive somnolence, will stop oxycodone  and xanax  for now.  08-06-2023 continue to hold opiates due to excessive somnolence. He is more awake now that he has been off oxycodone .  08-07-2023 continue to hold oxycodone . He does not appear to be having any withdrawal symptoms. Pt is more awake now that he is off oxycodone .  08-08-2023 continue to hold oxycodone . Has prn morphine . Do not give unless absolutely necessary.  08-09-2023 do not schedule opiates. It is better for pt not be on opiates in the future.

## 2023-08-03 NOTE — Assessment & Plan Note (Addendum)
 08-03-2023 stable. Cont asa.  08-05-2023 stable   08-06-2023 stable.  08-07-2023 stable.  08-08-2023 stable.  08-09-2023 stable.

## 2023-08-03 NOTE — Subjective & Objective (Addendum)
 Pt seen and examined.  Today's MBS reviewed. Discussed with ST. Discussed results with pt's wife [name]. She is aware that it is absolutely unsafe for pt to take anything by mouth. He is at high risk for aspiration and death due to aspiration pneumonia.   After much discussion, wife is agreeable to PEG tube to allow for enteral nutrition at home. She is still refusing to allow pt to go to SNF.   Will stop lovenox . Get PEG tomorrow. Wife will need education on bolus tube feeds. Plan for DC to home on Thursday.   Also discussed code status. Wife does not want pt to have CPR or be intubated for cardiac or respiratory arrest. Code status changed to DNR/DNI.  After further discussion, pt's wife is concerned about the family finances. She continues to refuse for patient to go to SNF. But she would allow home health therapy to see patient. Will order home health PT/OT/ST.

## 2023-08-03 NOTE — Assessment & Plan Note (Addendum)
 08-03-2023 continue SSI. Change to Ac/hs. Add lantus  5 units daily.  Lab Results  Component Value Date   HGBA1C 9.4 (H) 08/03/2023   08-05-2023 CBG adequate control. Continue with lantus  and SSI.  08-06-2023 now that he is on tube feeds. Will increase lantus  to 10 units daily. On SSI QID  08-07-2023 lantus  increased to 10 units daily yesterday. Continue with SSI.

## 2023-08-03 NOTE — Assessment & Plan Note (Addendum)
 08-03-2023 not on GDMT as outpatient.   08-05-2023 stable. LVEF 50-55%. Pt will need f/u with cardiology as outpatient.  08-06-2023 stable.  08-07-2023 stable.  08-08-2023 stable.  08-09-2023 stable.

## 2023-08-03 NOTE — Progress Notes (Signed)
 PHARMACY - ANTICOAGULATION CONSULT NOTE  Pharmacy Consult for heparin  Indication: atrial fibrillation  Allergies  Allergen Reactions   Ibuprofen Hives and Rash   Sulfa Antibiotics Hives    "makes my tongue break out"    Patient Measurements:    Vital Signs: Temp: 97.9 F (36.6 C) (04/23 1636) Temp Source: Oral (04/23 1636) BP: 137/64 (04/23 1636) Pulse Rate: 89 (04/23 1636)  Labs: Recent Labs    08/02/23 2254 08/03/23 0423  HGB 16.5 14.5  HCT 47.9 43.6  PLT 214 204  CREATININE 0.97 0.95    CrCl cannot be calculated (Unknown ideal weight.).   Medical History: Past Medical History:  Diagnosis Date   Acute biliary pancreatitis    Anxiety    Cholelithiasis    COPD (chronic obstructive pulmonary disease) (HCC)    Diabetes mellitus without complication (HCC)    Pneumonia    Prostate cancer (HCC)    Smokes 1 pack of cigarettes per day      Assessment: 8 YOM presenting with fatigue and confusion, new EKG with afib RVR, he is not on anticoagulation PTA, CBC wnl, received DVT ppx lovenox  40 mg this AM @1100   Goal of Therapy:  Heparin  level 0.3-0.7 units/ml Monitor platelets by anticoagulation protocol: Yes   Plan:  D/c lovenox  Heparin  2000 units IV x 1 and gtt at 1000 units/hr F/u 8 hour heparin  level F/u long term AC plan   Trinidad Funk, PharmD, [facility] Clinical Pharmacist ED Pharmacist Phone # [phone] 08/03/2023 7:14 PM

## 2023-08-03 NOTE — ED Notes (Signed)
 Son Navraj Dreibelbis [phone] would like an update asap

## 2023-08-03 NOTE — Progress Notes (Signed)
  X-cover Note: Called about pt having sustained HR in 140s. EKG obtained shows rapid afib  Will start cardizem  gtts. IV heparin  gtts.     Unk Garb, DO [facility]

## 2023-08-03 NOTE — ED Provider Notes (Signed)
 [place] [facility] DEPARTMENT AT [facility] Provider Note   CSN: [id number] Arrival date & time: 08/02/23  2157     History  Chief Complaint  Patient presents with   Weakness    Bruce Franklin is a 78 y.o. male.   Weakness Associated symptoms: cough    78 year old male with history of COPD, diabetes, kidney stones, presenting to the ED with family for increased weakness, confusion, and cough.  This has been ongoing for a few days now.  Seems to be progressively worsening.  Cough has been productive with thick, yellow sputum.  He has not had any fevers at home.  Has also had some elevated blood sugar readings.  No sick contacts reported.  He has not had any falls or head trauma.  Home Medications Prior to Admission medications   Medication Sig Start Date End Date Taking? Authorizing Provider  ALPRAZolam  (XANAX ) 1 MG tablet Take 1 mg by mouth 3 (three) times daily as needed for anxiety. 03/05/16   [provider]  amoxicillin -clavulanate (AUGMENTIN ) 875-125 MG tablet Take 1 tablet by mouth 2 (two) times daily. 04/02/16   Rizwan, Saima, MD  aspirin EC 81 MG tablet Take 81 mg by mouth daily.    [provider]  lidocaine  (LIDODERM ) 5 % Place 1 patch onto the skin daily. Remove & Discard patch within 12 hours or as directed by MD 12/25/17   Long, Shereen Dike, MD  metFORMIN  (GLUCOPHAGE ) 500 MG tablet Take 2 tablets (1,000 mg total) by mouth 2 (two) times daily. 11/16/14   McKeag, Tommie Frame, MD  nicotine  (NICODERM CQ  - DOSED IN MG/24 HOURS) 21 mg/24hr patch Place 1 patch (21 mg total) onto the skin daily. 04/02/16   Rizwan, Saima, MD  oxyCODONE  (ROXICODONE ) 5 MG/5ML solution Take 5-10 mLs (5-10 mg total) by mouth every 4 (four) hours as needed for severe pain. 04/02/16   Rizwan, Saima, MD  polyethylene glycol (MIRALAX  / GLYCOLAX ) packet Take 17 g by mouth 2 (two) times daily. 04/02/16   Rizwan, Saima, MD      Allergies    Ibuprofen    Review of Systems   Review  of Systems  Respiratory:  Positive for cough.   Neurological:  Positive for weakness.  All other systems reviewed and are negative.   Physical Exam Updated Vital Signs BP 114/84 (BP Location: Right Arm)   Pulse (!) 126   Temp 99.1 F (37.3 C)   Resp (!) 24   SpO2 90%   Physical Exam Vitals and nursing note reviewed.  Constitutional:      Appearance: He is well-developed.     Comments: Elderly, seems weak  HENT:     Head: Normocephalic and atraumatic.  Eyes:     Conjunctiva/sclera: Conjunctivae normal.     Pupils: Pupils are equal, round, and reactive to light.  Cardiovascular:     Rate and Rhythm: Regular rhythm. Tachycardia present.     Heart sounds: Normal heart sounds.  Pulmonary:     Effort: Pulmonary effort is normal.     Breath sounds: Normal breath sounds. No rhonchi.     Comments: Wet, rattly cough on exam, non productive currently, some rhonchi noted Abdominal:     General: Bowel sounds are normal.     Palpations: Abdomen is soft.  Musculoskeletal:        General: Normal range of motion.     Cervical back: Normal range of motion.  Skin:    General: Skin is warm  and dry.  Neurological:     Mental Status: He is alert and oriented to person, place, and time.     ED Results / Procedures / Treatments   Labs (all labs ordered are listed, but only abnormal results are displayed) Labs Reviewed  COMPREHENSIVE METABOLIC PANEL WITH GFR - Abnormal; Notable for the following components:      Result Value   Sodium 134 (*)    CO2 21 (*)    Glucose, Bld 333 (*)    All other components within normal limits  CBC WITH DIFFERENTIAL/PLATELET - Abnormal; Notable for the following components:   WBC 15.7 (*)    Neutro Abs 14.2 (*)    Lymphs Abs 0.5 (*)    All other components within normal limits  URINALYSIS, W/ REFLEX TO CULTURE (INFECTION SUSPECTED) - Abnormal; Notable for the following components:   Glucose, UA >=500 (*)    Ketones, ur 5 (*)    Protein, ur 30 (*)     All other components within normal limits  LACTIC ACID, PLASMA - Abnormal; Notable for the following components:   Lactic Acid, Venous 2.1 (*)    All other components within normal limits  LACTIC ACID, PLASMA - Abnormal; Notable for the following components:   Lactic Acid, Venous 3.0 (*)    All other components within normal limits  CBG MONITORING, ED - Abnormal; Notable for the following components:   Glucose-Capillary 370 (*)    All other components within normal limits  CBG MONITORING, ED - Abnormal; Notable for the following components:   Glucose-Capillary 323 (*)    All other components within normal limits  RESP PANEL BY RT-PCR (RSV, FLU A&B, COVID)  [id number]  CULTURE, BLOOD (ROUTINE X 2)  CULTURE, BLOOD (ROUTINE X 2)  HEMOGLOBIN A1C  BASIC METABOLIC PANEL WITH GFR  CBC  LEGIONELLA PNEUMOPHILA SEROGP 1 UR AG  STREP PNEUMONIAE URINARY ANTIGEN  LACTIC ACID, PLASMA    EKG None  Radiology DG Chest Portable 1 View Result Date: 08/02/2023 CLINICAL DATA:  Weakness EXAM: PORTABLE CHEST 1 VIEW COMPARISON:  07/25/2019 FINDINGS: Emphysema. Mild ground-glass infiltrate in the left lung base. No pleural effusion. Normal cardiac size. No pneumothorax IMPRESSION: Emphysema. Mild ground-glass infiltrate in the left lung base, probable pneumonia. Imaging follow-up to resolution is recommended Electronically Signed   By: Esmeralda Hedge M.D.   On: 08/02/2023 23:04    Procedures Procedures    CRITICAL CARE Performed by: Coretha Dew   Total critical care time: 45 minutes  Critical care time was exclusive of separately billable procedures and treating other patients.  Critical care was necessary to treat or prevent imminent or life-threatening deterioration.  Critical care was time spent personally by me on the following activities: development of treatment plan with patient and/or surrogate as well as nursing, discussions with consultants, evaluation of patient's response to  treatment, examination of patient, obtaining history from patient or surrogate, ordering and performing treatments and interventions, ordering and review of laboratory studies, ordering and review of radiographic studies, pulse oximetry and re-evaluation of patient's condition.   Medications Ordered in ED Medications  cefTRIAXone  (ROCEPHIN ) 1 g in sodium chloride  0.9 % 100 mL IVPB (has no administration in time range)  azithromycin  (ZITHROMAX ) 500 mg in sodium chloride  0.9 % 250 mL IVPB (has no administration in time range)  sodium chloride  0.9 % bolus 1,000 mL (has no administration in time range)    ED Course/ Medical Decision Making/ A&P  Medical Decision Making Amount and/or Complexity of Data Reviewed Labs: ordered. Radiology: ordered and independent interpretation performed. ECG/medicine tests: ordered and independent interpretation performed.  Risk Decision regarding hospitalization.   78 year old male presenting to the ED with increased confusion, weakness, and productive cough.  Meets SIRS criteria on arrival with low-grade fever, tachycardia, and mild hypoxia.  Septic workup was pursued.  Labs as above-- lactate 2.1.  WBC count 15K.  Does have some hyperglycemia but no findings of DKA.  Chest x-ray is concerning for pneumonia.  This correlates clinically.  RVP is negative.  Started IV fluids, Rocephin  and azithromycin .  Blood culture pending.  He will require admission.  Discussed with Dr. Brice Campi-- will admit for ongoing care.  Final Clinical Impression(s) / ED Diagnoses Final diagnoses:  Pneumonia of left lower lobe due to infectious organism  Generalized weakness    Rx / DC Orders ED Discharge Orders     None         Coretha Dew, PA-C 08/03/23 0428    Eve Hinders, MD 08/03/23 [phone]

## 2023-08-03 NOTE — Assessment & Plan Note (Addendum)
 08-03-2023 on RA. Sepsis presents on admission due to HR 126, WBC 15.7, lactic acid of 2.1. on IV Rocephin , po zithromax .  Repeat labs in AM. Possible DC to home in AM. PT consult.  08-04-2023 procal slightly elevated at 0.23.  on RA. Continue with IV rocephin /zithromax . He is not wheezing. No role of steroids.  08-04-2023 stable. Change to po duricef and zithromax . On RA. From Pneumonia standpoint, pt is stable for DC  08-05-2023 due to dysphagia/aspiration yesterday on MBS. Back on IV abx.  08-07-2023 continue with IV abx due to NPO status. Pneumonia appears to be improved. On RA. No fevers.  08-08-2023 continue with IV ABX for now since his g-tube will not be placed until tomorrow.  08-08-2023 continue with IV ABX due to NPO status. After IR placed G-tube tomorrow, abx can be changed to g-tube.

## 2023-08-03 NOTE — H&P (Signed)
 History and Physical    HART HAAS PPI:[id number] DOB: December 23, 1945 DOA: 08/02/2023  PCP: [facility]   Patient coming from: Home   Chief Complaint: Productive cough, fatigue, confusion   HPI: Bruce Franklin is a 78 y.o. male with medical history significant for hypertension, hyperlipidemia, CAD, chronic HFmrEF, COPD, and diabetes mellitus who presents with productive cough, fatigue, and confusion.  Patient's wife has become concerned due to the patient's worsening fatigue, confusion, and productive cough.  Symptoms have been worsening over the course of several days.  No fevers have been noted at home but his blood sugar has been higher than usual.    ED Course: Upon arrival to the ED, patient is found to be afebrile and saturating 90% on room air with tachypnea, tachycardia, and stable BP.  Labs are most notable for glucosuria 33, WBC 15,700, lactic acid 2.1, and negative respiratory virus panel.  Chest x-ray is concerning for pneumonia.  Blood cultures were collected and the patient was given a liter of saline, Rocephin , and azithromycin .  Review of Systems:  ROS limited by patient's clinical condition.  Past Medical History:  Diagnosis Date   Acute biliary pancreatitis    Anxiety    Cholelithiasis    COPD (chronic obstructive pulmonary disease) (HCC)    Diabetes mellitus without complication (HCC)    Pneumonia    Prostate cancer (HCC)    Smokes 1 pack of cigarettes per day     Past Surgical History:  Procedure Laterality Date   CHOLECYSTECTOMY N/A 04/01/2016   Procedure: LAPAROSCOPIC CHOLECYSTECTOMY;  Surgeon: Adalberto Acton, MD;  Location: [facility] OR;  Service: General;  Laterality: N/A;   ERCP N/A 03/30/2016   Procedure: ENDOSCOPIC RETROGRADE CHOLANGIOPANCREATOGRAPHY (ERCP);  Surgeon: Janel Medford, MD;  Location: [facility] ENDOSCOPY;  Service: Endoscopy;  Laterality: N/A;   LUMBAR FUSION     PROSTATE SURGERY     ROTATOR CUFF REPAIR      Social History:    reports that he has been smoking cigarettes. He has a 52 pack-year smoking history. He does not have any smokeless tobacco history on file. He reports that he does not drink alcohol  and does not use drugs.  Allergies  Allergen Reactions   Ibuprofen Rash, Anaphylaxis and Hives    Had no trouble breathing   Sulfa Antibiotics Hives    "makes my tongue break out"    Family History  Problem Relation Age of Onset   Heart attack Mother    COPD Father      Prior to Admission medications   Medication Sig Start Date End Date Taking? Authorizing Provider  albuterol  (VENTOLIN  HFA) 108 (90 Base) MCG/ACT inhaler Inhale 2 puffs into the lungs every 6 (six) hours as needed for shortness of breath or wheezing. 07/04/23  Yes [provider]  ALPRAZolam  (XANAX ) 0.5 MG tablet Take 0.5 mg by mouth 3 (three) times daily as needed. 07/07/23  Yes [provider]  metFORMIN  (GLUCOPHAGE ) 1000 MG tablet Take 1,000 mg by mouth 2 (two) times daily. 06/03/23  Yes [provider]  oxyCODONE -acetaminophen  (PERCOCET) 10-325 MG tablet Take 1 tablet by mouth 4 (four) times daily as needed. 08/02/23  Yes [provider]  ALPRAZolam  (XANAX ) 1 MG tablet Take 1 mg by mouth 3 (three) times daily as needed for anxiety. 03/05/16   [provider]  aspirin EC 81 MG tablet Take 81 mg by mouth daily.    [provider]  lidocaine  (LIDODERM ) 5 % Place 1  patch onto the skin daily. Remove & Discard patch within 12 hours or as directed by MD 12/25/17   Long, Shereen Dike, MD  metFORMIN  (GLUCOPHAGE ) 500 MG tablet Take 2 tablets (1,000 mg total) by mouth 2 (two) times daily. 11/16/14   McKeag, Tommie Frame, MD  nicotine  (NICODERM CQ  - DOSED IN MG/24 HOURS) 21 mg/24hr patch Place 1 patch (21 mg total) onto the skin daily. 04/02/16   Rizwan, Saima, MD  oxyCODONE  (ROXICODONE ) 5 MG/5ML solution Take 5-10 mLs (5-10 mg total) by mouth every 4 (four) hours as needed for severe pain. 04/02/16   Rizwan, Saima,  MD  polyethylene glycol (MIRALAX  / GLYCOLAX ) packet Take 17 g by mouth 2 (two) times daily. 04/02/16   Sedalia Dacosta, MD    Physical Exam: Vitals:   08/03/23 0043 08/03/23 0100 08/03/23 0130 08/03/23 0200  BP: (!) 152/83 94/76 (!) 122/59 (!) 146/82  Pulse: (!) 109 (!) 105 (!) 105 (!) 110  Resp: 18 (!) 24 (!) 30 (!) 34  Temp: 97.8 F (36.6 C)     TempSrc: Oral     SpO2: 90% 94% 95% 95%    Constitutional: NAD, no diaphoresis   Eyes: PERTLA, lids and conjunctivae normal ENMT: Mucous membranes are moist. Posterior pharynx clear of any exudate or lesions.   Neck: supple, no masses  Respiratory: Mild tachypnea, rhonchi on left, no wheezing. No accessory muscle use.  Cardiovascular: S1 & S2 heard, regular rate and rhythm. No extremity edema.  Abdomen: No distension, no tenderness, soft. Bowel sounds active.  Musculoskeletal: no clubbing / cyanosis. No joint deformity upper and lower extremities.   Skin: no significant rashes, lesions, ulcers. Warm, dry, well-perfused. Neurologic: CN 2-12 grossly intact. Moving all extremities. Alert and oriented to person, and knows he is in a hospital.  Psychiatric: Calm. Cooperative.    Labs and Imaging on Admission: I have personally reviewed following labs and imaging studies  CBC: Recent Labs  Lab 08/02/23 2254  WBC 15.7*  NEUTROABS 14.2*  HGB 16.5  HCT 47.9  MCV 90.7  PLT 214   Basic Metabolic Panel: Recent Labs  Lab 08/02/23 2254  NA 134*  K 4.4  CL 100  CO2 21*  GLUCOSE 333*  BUN 14  CREATININE 0.97  CALCIUM  9.7   GFR: CrCl cannot be calculated (Unknown ideal weight.). Liver Function Tests: Recent Labs  Lab 08/02/23 2254  AST 17  ALT 15  ALKPHOS 41  BILITOT 1.2  PROT 7.3  ALBUMIN 3.7   No results for input(s): "LIPASE", "AMYLASE" in the last 168 hours. No results for input(s): "AMMONIA" in the last 168 hours. Coagulation Profile: No results for input(s): "INR", "PROTIME" in the last 168 hours. Cardiac  Enzymes: No results for input(s): "CKTOTAL", "CKMB", "CKMBINDEX", "TROPONINI" in the last 168 hours. BNP (last 3 results) No results for input(s): "PROBNP" in the last 8760 hours. HbA1C: No results for input(s): "HGBA1C" in the last 72 hours. CBG: Recent Labs  Lab 08/02/23 2254 08/03/23 0036  GLUCAP 370* 323*   Lipid Profile: No results for input(s): "CHOL", "HDL", "LDLCALC", "TRIG", "CHOLHDL", "LDLDIRECT" in the last 72 hours. Thyroid Function Tests: No results for input(s): "TSH", "T4TOTAL", "FREET4", "T3FREE", "THYROIDAB" in the last 72 hours. Anemia Panel: No results for input(s): "VITAMINB12", "FOLATE", "FERRITIN", "TIBC", "IRON", "RETICCTPCT" in the last 72 hours. Urine analysis:    Component Value Date/Time   COLORURINE YELLOW 08/03/2023 0300   APPEARANCEUR CLEAR 08/03/2023 0300   LABSPEC 1.010 08/03/2023 0300   PHURINE 5.0  08/03/2023 0300   GLUCOSEU >=500 (A) 08/03/2023 0300   HGBUR NEGATIVE 08/03/2023 0300   BILIRUBINUR NEGATIVE 08/03/2023 0300   KETONESUR 5 (A) 08/03/2023 0300   PROTEINUR 30 (A) 08/03/2023 0300   UROBILINOGEN 0.2 11/14/2014 1851   NITRITE NEGATIVE 08/03/2023 0300   LEUKOCYTESUR NEGATIVE 08/03/2023 0300   Sepsis Labs: @LABRCNTIP (procalcitonin:4,lacticidven:4) ) Recent Results (from the past 240 hours)  Resp panel by RT-PCR (RSV, Flu A&B, Covid) Anterior Nasal Swab     Status: None   Collection Time: 08/02/23 10:40 PM   Specimen: Anterior Nasal Swab  Result Value Ref Range Status   SARS Coronavirus 2 by RT PCR NEGATIVE NEGATIVE Final   Influenza A by PCR NEGATIVE NEGATIVE Final   Influenza B by PCR NEGATIVE NEGATIVE Final    Comment: (NOTE) The Xpert Xpress SARS-CoV-2/FLU/RSV plus assay is intended as an aid in the diagnosis of influenza from Nasopharyngeal swab specimens and should not be used as a sole basis for treatment. Nasal washings and aspirates are unacceptable for Xpert Xpress SARS-CoV-2/FLU/RSV testing.  Fact Sheet for  Patients: BloggerCourse.com  Fact Sheet for Healthcare Providers: SeriousBroker.it  This test is not yet approved or cleared by the [place]  FDA and has been authorized for detection and/or diagnosis of SARS-CoV-2 by FDA under an Emergency Use Authorization (EUA). This EUA will remain in effect (meaning this test can be used) for the duration of the COVID-19 declaration under Section 564(b)(1) of the Act, 21 U.S.C. section 360bbb-3(b)(1), unless the authorization is terminated or revoked.     Resp Syncytial Virus by PCR NEGATIVE NEGATIVE Final    Comment: (NOTE) Fact Sheet for Patients: BloggerCourse.com  Fact Sheet for Healthcare Providers: SeriousBroker.it  This test is not yet approved or cleared by the [place]  FDA and has been authorized for detection and/or diagnosis of SARS-CoV-2 by FDA under an Emergency Use Authorization (EUA). This EUA will remain in effect (meaning this test can be used) for the duration of the COVID-19 declaration under Section 564(b)(1) of the Act, 21 U.S.C. section 360bbb-3(b)(1), unless the authorization is terminated or revoked.  Performed at [facility], [address] [place], [place] [address]      Radiological Exams on Admission: DG Chest Portable 1 View Result Date: 08/02/2023 CLINICAL DATA:  Weakness EXAM: PORTABLE CHEST 1 VIEW COMPARISON:  07/25/2019 FINDINGS: Emphysema. Mild ground-glass infiltrate in the left lung base. No pleural effusion. Normal cardiac size. No pneumothorax IMPRESSION: Emphysema. Mild ground-glass infiltrate in the left lung base, probable pneumonia. Imaging follow-up to resolution is recommended Electronically Signed   By: Esmeralda Hedge M.D.   On: 08/02/2023 23:04    EKG: Independently reviewed. Sinus tachycardia, rate 115.   Assessment/Plan  1. Pneumonia; acute hypoxic respiratory failure  -  Check strep pneumo and legionella antigens, continue Rocephin  and azithromycin , supplemental O2 as-needed, trend lactate, follow cultures and clinical response to treatment    2. Acute encephalopathy  - Exam is non-focal, anticipate improvement with treatment of pneumonia  - Delirium precautions    3. CAD - No anginal complaints   4. Type II DM  - A1c was 8.1% in 2021  - Check CBGs, use SSI for now   5. Chronic HFmrEF  - EF was 40% on echo from June 2021  - Appears hypovolemic on admission  - Monitor weight and I/Os   6. COPD  - Not in exacerbation  - Short-acting bronchodilators as-needed    DVT prophylaxis: Lovenox   Code Status: Full  Level  of Care: Level of care: Progressive Family Communication: Wife updated from ED  Disposition Plan:  Patient is from: Home  Anticipated d/c is to: TBD Anticipated d/c date is: 08/06/23  Patient currently: Pending improved respiratory and mental status Consults called: None  Admission status: Inpatient     Walton Guppy, MD [facility]  08/03/2023, 3:49 AM

## 2023-08-03 NOTE — Assessment & Plan Note (Addendum)
 08-03-2023 continue prn duonebs. No need for steroids. Not exacerbated.  08-05-2023 stable.  08-06-2023 stable.  08-07-2023 stable. Not exacerbated. No need for steroids.  08-08-2023 stable.  08-09-2023 stable.

## 2023-08-03 NOTE — Inpatient Diabetes Management (Signed)
 Inpatient Diabetes Program Recommendations  AACE/ADA: New Consensus Statement on Inpatient Glycemic Control (2015)  Target Ranges:  Prepandial:   less than 140 mg/dL      Peak postprandial:   less than 180 mg/dL (1-2 hours)      Critically ill patients:  140 - 180 mg/dL   Lab Results  Component Value Date   GLUCAP 242 (H) 08/03/2023   HGBA1C 9.4 (H) 08/03/2023    Review of Glycemic Control  Latest Reference Range & Units 08/02/23 22:54 08/03/23 00:36 08/03/23 04:27 08/03/23 08:19 08/03/23 12:53  Glucose-Capillary 70 - 99 mg/dL 161 (H) 096 (H) 045 (H) 192 (H) 242 (H)   Diabetes history: DM 2 Outpatient Diabetes medications: Metformin  1000 mg bid Current orders for Inpatient glycemic control:  Novolog  0-9 units Q4 hours  A1c 9.4% on 4/23  Inpatient Diabetes Program Recommendations:    -   Increase Novolog  Correction to 0-15 units q4  Will follow pt.  Thanks,  Eloise Hake RN, MSN, BC-ADM Inpatient Diabetes Coordinator Team Pager [phone] (8a-5p)

## 2023-08-04 ENCOUNTER — Other Ambulatory Visit (HOSPITAL_COMMUNITY): Payer: Self-pay

## 2023-08-04 ENCOUNTER — Inpatient Hospital Stay (HOSPITAL_COMMUNITY)

## 2023-08-04 ENCOUNTER — Telehealth (HOSPITAL_COMMUNITY): Payer: Self-pay | Admitting: Pharmacy Technician

## 2023-08-04 DIAGNOSIS — I4891 Unspecified atrial fibrillation: Secondary | ICD-10-CM

## 2023-08-04 DIAGNOSIS — Z79891 Long term (current) use of opiate analgesic: Secondary | ICD-10-CM | POA: Diagnosis not present

## 2023-08-04 DIAGNOSIS — E785 Hyperlipidemia, unspecified: Secondary | ICD-10-CM | POA: Diagnosis not present

## 2023-08-04 DIAGNOSIS — I5022 Chronic systolic (congestive) heart failure: Secondary | ICD-10-CM | POA: Diagnosis not present

## 2023-08-04 DIAGNOSIS — J189 Pneumonia, unspecified organism: Secondary | ICD-10-CM | POA: Diagnosis not present

## 2023-08-04 DIAGNOSIS — I480 Paroxysmal atrial fibrillation: Secondary | ICD-10-CM | POA: Diagnosis not present

## 2023-08-04 LAB — MAGNESIUM: Magnesium: 1.5 mg/dL — ABNORMAL LOW (ref 1.7–2.4)

## 2023-08-04 LAB — ECHOCARDIOGRAM COMPLETE
AR max vel: 3.19 cm2
AV Area VTI: 3.39 cm2
AV Area mean vel: 3.3 cm2
AV Mean grad: 1 mmHg
AV Peak grad: 2.3 mmHg
Ao pk vel: 0.76 m/s
Area-P 1/2: 4.65 cm2
Calc EF: 64.9 %
MV VTI: 4.29 cm2
S' Lateral: 3.5 cm
Single Plane A2C EF: 71.4 %
Single Plane A4C EF: 57.4 %

## 2023-08-04 LAB — CBC WITH DIFFERENTIAL/PLATELET
Abs Immature Granulocytes: 0.04 10*3/uL (ref 0.00–0.07)
Basophils Absolute: 0.1 10*3/uL (ref 0.0–0.1)
Basophils Relative: 1 %
Eosinophils Absolute: 0.1 10*3/uL (ref 0.0–0.5)
Eosinophils Relative: 1 %
HCT: 46.9 % (ref 39.0–52.0)
Hemoglobin: 16.2 g/dL (ref 13.0–17.0)
Immature Granulocytes: 0 %
Lymphocytes Relative: 16 %
Lymphs Abs: 1.7 10*3/uL (ref 0.7–4.0)
MCH: 31.6 pg (ref 26.0–34.0)
MCHC: 34.5 g/dL (ref 30.0–36.0)
MCV: 91.6 fL (ref 80.0–100.0)
Monocytes Absolute: 0.8 10*3/uL (ref 0.1–1.0)
Monocytes Relative: 8 %
Neutro Abs: 7.8 10*3/uL — ABNORMAL HIGH (ref 1.7–7.7)
Neutrophils Relative %: 74 %
Platelets: 213 10*3/uL (ref 150–400)
RBC: 5.12 MIL/uL (ref 4.22–5.81)
RDW: 13.1 % (ref 11.5–15.5)
WBC: 10.4 10*3/uL (ref 4.0–10.5)
nRBC: 0 % (ref 0.0–0.2)

## 2023-08-04 LAB — BASIC METABOLIC PANEL WITH GFR
Anion gap: 13 (ref 5–15)
BUN: 9 mg/dL (ref 8–23)
CO2: 23 mmol/L (ref 22–32)
Calcium: 8.9 mg/dL (ref 8.9–10.3)
Chloride: 102 mmol/L (ref 98–111)
Creatinine, Ser: 0.72 mg/dL (ref 0.61–1.24)
GFR, Estimated: >60 mL/min (ref >60)
Glucose, Bld: 231 mg/dL — ABNORMAL HIGH (ref 70–99)
Potassium: 3.2 mmol/L — ABNORMAL LOW (ref 3.5–5.1)
Sodium: 138 mmol/L (ref 135–145)

## 2023-08-04 LAB — GLUCOSE, CAPILLARY
Glucose-Capillary: 232 mg/dL — ABNORMAL HIGH (ref 70–99)
Glucose-Capillary: 135 mg/dL — ABNORMAL HIGH (ref 70–99)

## 2023-08-04 LAB — HEPARIN LEVEL (UNFRACTIONATED)
Heparin Unfractionated: 0.12 [IU]/mL — ABNORMAL LOW (ref 0.30–0.70)
Heparin Unfractionated: 0.31 [IU]/mL (ref 0.30–0.70)

## 2023-08-04 LAB — CBG MONITORING, ED
Glucose-Capillary: 185 mg/dL — ABNORMAL HIGH (ref 70–99)
Glucose-Capillary: 246 mg/dL — ABNORMAL HIGH (ref 70–99)
Glucose-Capillary: 270 mg/dL — ABNORMAL HIGH (ref 70–99)

## 2023-08-04 LAB — PROCALCITONIN: Procalcitonin: 0.23 ng/mL

## 2023-08-04 MED ORDER — HEPARIN BOLUS VIA INFUSION
2000.0000 [IU] | Freq: Once | INTRAVENOUS | Status: AC
  Administered 2023-08-04: 2000 [IU] via INTRAVENOUS
  Filled 2023-08-04: qty 2000

## 2023-08-04 MED ORDER — POTASSIUM CHLORIDE CRYS ER 20 MEQ PO TBCR
40.0000 meq | EXTENDED_RELEASE_TABLET | ORAL | Status: AC
  Administered 2023-08-04 (×2): 40 meq via ORAL
  Filled 2023-08-04 (×2): qty 2

## 2023-08-04 MED ORDER — MAGNESIUM SULFATE 2 GM/50ML IV SOLN
2.0000 g | Freq: Once | INTRAVENOUS | Status: AC
  Administered 2023-08-04: 2 g via INTRAVENOUS
  Filled 2023-08-04: qty 50

## 2023-08-04 MED ORDER — METOPROLOL SUCCINATE ER 25 MG PO TB24
50.0000 mg | ORAL_TABLET | Freq: Every day | ORAL | Status: DC
  Administered 2023-08-04 – 2023-08-05 (×2): 50 mg via ORAL
  Filled 2023-08-04 (×2): qty 2

## 2023-08-04 MED ORDER — PERFLUTREN LIPID MICROSPHERE
1.0000 mL | INTRAVENOUS | Status: AC | PRN
  Administered 2023-08-04: 2 mL via INTRAVENOUS

## 2023-08-04 MED ORDER — POTASSIUM CHLORIDE 10 MEQ/100ML IV SOLN
10.0000 meq | INTRAVENOUS | Status: AC
  Administered 2023-08-04 (×2): 10 meq via INTRAVENOUS
  Filled 2023-08-04 (×2): qty 100

## 2023-08-04 MED ORDER — APIXABAN 5 MG PO TABS
5.0000 mg | ORAL_TABLET | Freq: Two times a day (BID) | ORAL | Status: DC
  Administered 2023-08-04 – 2023-08-05 (×3): 5 mg via ORAL
  Filled 2023-08-04 (×3): qty 1

## 2023-08-04 NOTE — ED Notes (Signed)
 PT was wet due to urine. Nurse gave pt a bed bath, and changed pt bedding. Nurse put barrier cream on pt bottom, roll on deodorant under his arm. PT placed on condom cath.

## 2023-08-04 NOTE — Plan of Care (Signed)

## 2023-08-04 NOTE — Assessment & Plan Note (Addendum)
 08-04-2023 went into rapid afib yesterday. Started on IV cardizem . Echo pending. On IV heparin . Will ask cardiology in utility of DCCV vs chemical cardioversion given his prior hx of ischemic cardiomyopathy. Echo in 2021 showed partially recovered LVEF of 40%.  08-05-2023 echo shows 50-55%. Cards changed pt over to po eliquis  and Toprol -XL 50 mg daily. Awaiting DC clearance from cardiology.  08-06-2023 back in NSR. On IV lopressor  due to dysphagia. On SQ lovenox  due to dysphagia.  08-07-2023 in NSR. On IV lopressor  due to NPO from dysphagia. On SQ lovenox  due to inability to swallow Eliquis .  08-08-2023 remains on IV lopressor  due to NPO from dysphagia. Will hold lovenox  to allow for g-tube placement tomorrow. Then can change to eliquid BID via g-tube.  08-09-2023 remains on IV lopressor . Continue to hold systemic anticoagulation until after his g-tube is placed tomorrow. Pt remains in NSR on telemetry. Can start Eliquis  via g-tube when timing is appropriate.

## 2023-08-04 NOTE — Progress Notes (Signed)
 PHARMACY - ANTICOAGULATION CONSULT NOTE  Pharmacy Consult for heparin  Indication: atrial fibrillation  Allergies  Allergen Reactions   Ibuprofen Hives and Rash   Sulfa Antibiotics Hives    "makes my tongue break out"    Patient Measurements: Height: 6\' 3"  (190.5 cm) Weight: 66.5 kg (146 lb 9.7 oz) IBW/kg (Calculated) : 84.5 HEPARIN  DW (KG): 66.5  Vital Signs: Temp: 97.9 F (36.6 C) (04/24 1318) Temp Source: Oral (04/24 1318) BP: 118/68 (04/24 1318) Pulse Rate: 88 (04/24 1318)  Labs: Recent Labs    08/02/23 2254 08/03/23 0423 08/03/23 2023 08/04/23 0337 08/04/23 1230  HGB 16.5 14.5  --  16.2  --   HCT 47.9 43.6  --  46.9  --   PLT 214 204  --  213  --   APTT  --   --  26  --   --   HEPARINUNFRC  --   --   --  0.12* 0.31  CREATININE 0.97 0.95  --  0.72  --     Estimated Creatinine Clearance: 72.7 mL/min (by C-G formula based on SCr of 0.72 mg/dL).   Medical History: Past Medical History:  Diagnosis Date   Acute biliary pancreatitis    Anxiety    Cholelithiasis    COPD (chronic obstructive pulmonary disease) (HCC)    Diabetes mellitus without complication (HCC)    Pneumonia    Prostate cancer (HCC)    Smokes 1 pack of cigarettes per day      Assessment: 85 YOM presenting with fatigue and confusion, new EKG with afib RVR, he is not on anticoagulation PTA, CBC wnl, received DVT ppx lovenox  40 mg this AM @1100   Heparin  level this afternoon within goal range, but then heparin  switched to po Eliquis .  Goal of Therapy:  Heparin  level 0.3-0.7 units/ml Monitor platelets by anticoagulation protocol: Yes   Plan:  Eliquis  5 mg po BID. Copay is $12.15. Will complete education prior to dc.  Joanell Mowers, Catheryn Cluck, BCPS, BCCP Clinical Pharmacist  08/04/2023 2:06 PM   [facility] pharmacy phone numbers are listed on [email or web address]

## 2023-08-04 NOTE — Progress Notes (Signed)
 PHARMACY - ANTICOAGULATION CONSULT NOTE  Pharmacy Consult for heparin  Indication: atrial fibrillation  Labs: Recent Labs    08/02/23 2254 08/03/23 0423 08/03/23 2023 08/04/23 0337  HGB 16.5 14.5  --  16.2  HCT 47.9 43.6  --  46.9  PLT 214 204  --  213  APTT  --   --  26  --   HEPARINUNFRC  --   --   --  0.12*  CREATININE 0.97 0.95  --   --    Assessment: 77yo male subtherapeutic on heparin  with initial dosing for new Afib; no infusion issues or signs of bleeding per RN.  Goal of Therapy:  Heparin  level 0.3-0.7 units/ml   Plan:  2000 units heparin  bolus. Increase heparin  infusion by 20% to 1200 units/hr. Check level in 8 hours.   Bruce Franklin, PharmD, BCPS 08/04/2023 4:28 AM

## 2023-08-04 NOTE — Evaluation (Signed)
 Physical Therapy Evaluation Patient Details Name: Bruce Franklin MRN: [id number] DOB: 01/02/1946 Today's Date: 08/04/2023  History of Present Illness  Pt is 78 yo presenting to [facility] ED on 4/22 due to worsening cough, fatigue and confusion. PMH: HTN, hyperlipidemia, CAD, chronic HFmrEF, COPD, DM, ERCP  Clinical Impression  Pt is presenting below baseline level of functioning per MD notes.  Per ED notes seems pt was living with spouse and becoming weak but able to get around. Currently pt is Mod A for bed mobility, Max A for sit to stand and unable to progress gait. Due to pt current functional status, home set up and available assistance at home recommending skilled physical therapy services < 3 hours/day in order to address strength, balance and functional mobility to decrease risk for falls, injury, immobility, skin break down and re-hospitalization.         If plan is discharge home, recommend the following: A lot of help with walking and/or transfers;Assist for transportation;Assistance with cooking/housework;Help with stairs or ramp for entrance   Can travel by private vehicle   No    Equipment Recommendations Wheelchair (measurements PT);Wheelchair cushion (measurements PT);BSC/3in1;Hospital bed     Functional Status Assessment Patient has had a recent decline in their functional status and demonstrates the ability to make significant improvements in function in a reasonable and predictable amount of time.     Precautions / Restrictions Precautions Precautions: Fall Recall of Precautions/Restrictions: Impaired Restrictions Weight Bearing Restrictions Per Provider Order: No      Mobility  Bed Mobility Overal bed mobility: Needs Assistance Bed Mobility: Supine to Sit, Sit to Supine     Supine to sit: Mod assist Sit to supine: Mod assist   General bed mobility comments: Mod A for trunk to mid line and bil LE to bed after sitting    Transfers Overall transfer level: Needs  assistance Equipment used: None Transfers: Sit to/from Stand Sit to Stand: Max assist           General transfer comment: Max A for standing edge of stretcher with 1 person in front of patient using gait belt.    Ambulation/Gait   Pre-gait activities: attempted to side step at EOB Max A to assist with wgt shifting with demonstration. Pt was able to take 5 small steps alternating side stepping with Mod A wgt shifting R/L and Max A maintaining balance.         Balance Overall balance assessment: Needs assistance Sitting-balance support: Bilateral upper extremity supported, Feet unsupported Sitting balance-Leahy Scale: Poor Sitting balance - Comments: CGA for safety   Standing balance support: Bilateral upper extremity supported Standing balance-Leahy Scale: Zero Standing balance comment: Relies on external support         Pertinent Vitals/Pain Pain Assessment Pain Assessment: Faces Faces Pain Scale: No hurt Breathing: normal Negative Vocalization: none Facial Expression: smiling or inexpressive Body Language: relaxed Consolability: no need to console PAINAD Score: 0 Pain Intervention(s): Monitored during session    Home Living Family/patient expects to be discharged to:: Skilled nursing facility Living Arrangements: Spouse/significant other Available Help at Discharge: (P) Family;Available 24 hours/day             Extremity/Trunk Assessment   Upper Extremity Assessment Upper Extremity Assessment: Defer to OT evaluation    Lower Extremity Assessment Lower Extremity Assessment: Generalized weakness    Cervical / Trunk Assessment Cervical / Trunk Assessment: Kyphotic  Communication   Communication Communication: Impaired Factors Affecting Communication: Reduced clarity of speech  Cognition Arousal: Alert Behavior During Therapy: WFL for tasks assessed/performed   PT - Cognitive impairments: Orientation, Awareness, Safety/Judgement, Initiation,  Sequencing, Attention, Memory   Orientation impairments: Time, Situation       Following commands: Impaired Following commands impaired: Follows one step commands with increased time     Cueing Cueing Techniques: Verbal cues, Gestural cues, Tactile cues, Visual cues     General Comments General comments (skin integrity, edema, etc.): Pt demonstrates no significant distress during session.        Assessment/Plan    PT Assessment Patient needs continued PT services  PT Problem List Decreased strength;Decreased mobility;Decreased safety awareness;Decreased balance;Decreased activity tolerance       PT Treatment Interventions DME instruction;Therapeutic exercise;Gait training;Balance training;Stair training;Neuromuscular re-education;Functional mobility training;Therapeutic activities;Patient/family education    PT Goals (Current goals can be found in the Care Plan section)  Acute Rehab PT Goals PT Goal Formulation: Patient unable to participate in goal setting Time For Goal Achievement: 08/18/23 Potential to Achieve Goals: Fair    Frequency Min 2X/week        AM-PAC PT "6 Clicks" Mobility  Outcome Measure Help needed turning from your back to your side while in a flat bed without using bedrails?: A Lot Help needed moving from lying on your back to sitting on the side of a flat bed without using bedrails?: A Lot Help needed moving to and from a bed to a chair (including a wheelchair)?: A Lot Help needed standing up from a chair using your arms (e.g., wheelchair or bedside chair)?: A Lot Help needed to walk in hospital room?: Total Help needed climbing 3-5 steps with a railing? : Total 6 Click Score: 10    End of Session Equipment Utilized During Treatment: Gait belt Activity Tolerance: Patient tolerated treatment well;Patient limited by fatigue Patient left: in bed;with call bell/phone within reach Nurse Communication: Mobility status PT Visit Diagnosis: Other  abnormalities of gait and mobility (R26.89);Unsteadiness on feet (R26.81);Muscle weakness (generalized) (M62.81)    Time: 1308-6578 PT Time Calculation (min) (ACUTE ONLY): 15 min   Charges:   PT Evaluation $PT Eval Low Complexity: 1 Low   PT General Charges $$ ACUTE PT VISIT: 1 Visit       Sloan Duncans, DPT, CLT  Acute Rehabilitation Services Office: [phone] (Secure chat preferred)   Jenice Mitts 08/04/2023, 12:24 PM

## 2023-08-04 NOTE — Consult Note (Addendum)
 Cardiology Consultation   Patient ID: Bruce Franklin MRN: [id number]; DOB: 1945-11-01  Admit date: 08/02/2023 Date of Consult: 08/04/2023  PCP:  [facility]   [facility] Providers Cardiologist:  [facility] in 2023, transferring care to [facility]  Patient Profile:   Bruce Franklin is a 78 y.o. male with a hx of CAD s/p STEMI 03/2018, chronic HFmrEF, ischemic cardiomyopathy, history of prior apical LV thrombus, diabetes, COPD, HTN, HLD, who is being seen 08/04/2023 for the evaluation of A. Fib with RVR at the request of Dr. Farrel Hones.  History of Present Illness:   [name] has past medical history as stated above. He presented to [facility] ED 08/03/2023 for weakness, confusion and cough. After initial workup he was found to have pneumonia and was admitted to the hospital. He was also found to be septic with elevated HR, lactic acidosis. He was started on IV antibiotics for treatment.   While in the hospital, he developed atrial fibrillation with RVR. He was started on IV diltiazem  as well as IV heparin . He has a past medical history of A. Fib that was first noted post-ERCP in 2021. He then saw [facility]ology 11/2021 for his CAD, chronic CHF, apical LV thrombus, HTN and HLD. His LV thrombus was treated with Plavix  + Xarelto  x 12 months and then was not seen on follow up echocardiogram. He was on ASA, lisinopril , metoprolol  and Crestor however the patient reported that he stopped taking them because he wanted to rely on his faith as opposed to medical therapy. It appears that he never followed up with [facility] cardiology or another cardiology practice since 2023.   Cardiology has been consulted in the setting of treatment of rapid A. Fib.  After speaking with the patient and his wife, they agree with the history stated above.  They state that he has stopped taking his medications around 2023 and has likely not been on any medication since.  I explained the  importance of taking these medications daily and not missing many doses.  Upon educating on the importance of these medications, him and his wife seem to be agreeable to be able to take medications at discharge.  Open to the idea of establishing with [facility] practice as opposed to returning to [facility].  Past Medical History:  Diagnosis Date   Acute biliary pancreatitis    Anxiety    Cholelithiasis    COPD (chronic obstructive pulmonary disease) (HCC)    Diabetes mellitus without complication (HCC)    Pneumonia    Prostate cancer (HCC)    Smokes 1 pack of cigarettes per day    Past Surgical History:  Procedure Laterality Date   CHOLECYSTECTOMY N/A 04/01/2016   Procedure: LAPAROSCOPIC CHOLECYSTECTOMY;  Surgeon: Adalberto Acton, MD;  Location: [facility] OR;  Service: General;  Laterality: N/A;   ERCP N/A 03/30/2016   Procedure: ENDOSCOPIC RETROGRADE CHOLANGIOPANCREATOGRAPHY (ERCP);  Surgeon: Janel Medford, MD;  Location: [facility] ENDOSCOPY;  Service: Endoscopy;  Laterality: N/A;   LUMBAR FUSION     PROSTATE SURGERY     ROTATOR CUFF REPAIR      Home Medications:  Prior to Admission medications   Medication Sig Start Date End Date Taking? Authorizing Provider  albuterol  (VENTOLIN  HFA) 108 (90 Base) MCG/ACT inhaler Inhale 2 puffs into the lungs every 6 (six) hours as needed for shortness of breath or wheezing. 07/04/23  Yes [provider]  ALPRAZolam  (XANAX ) 0.5 MG tablet Take 0.5 mg by  mouth 3 (three) times daily as needed for anxiety. 07/07/23  Yes [provider]  aspirin EC 81 MG tablet Take 81 mg by mouth daily.   Yes [provider]  gabapentin (NEURONTIN) 300 MG capsule Take 300 mg by mouth 2 (two) times daily as needed (pain). 06/05/23  Yes [provider]  metFORMIN  (GLUCOPHAGE ) 1000 MG tablet Take 1,000 mg by mouth 2 (two) times daily. 06/03/23  Yes [provider]  oxyCODONE -acetaminophen  (PERCOCET) 10-325 MG tablet Take 1 tablet by mouth 4  (four) times daily as needed for pain. 08/02/23  Yes [provider]  OZEMPIC, 1 MG/DOSE, 4 MG/3ML SOPN Inject 1 mg into the skin once a week. 04/30/23  Yes [provider]   Inpatient Medications: Scheduled Meds:  azithromycin   500 mg Oral Daily   insulin  aspart  0-5 Units Subcutaneous QHS   insulin  aspart  0-9 Units Subcutaneous TID WC   insulin  glargine-yfgn  5 Units Subcutaneous Daily   sodium chloride  flush  3 mL Intravenous Q12H   Continuous Infusions:  cefTRIAXone  (ROCEPHIN )  IV Stopped (08/04/23 1029)   diltiazem  (CARDIZEM ) infusion 15 mg/hr (08/04/23 1152)   heparin  1,200 Units/hr (08/04/23 1136)   PRN Meds: acetaminophen  **OR** acetaminophen , ALPRAZolam , guaiFENesin , ipratropium-albuterol , ondansetron  **OR** ondansetron  (ZOFRAN ) IV, oxyCODONE , senna-docusate  Allergies:    Allergies  Allergen Reactions   Ibuprofen Hives and Rash   Sulfa Antibiotics Hives    "makes my tongue break out"   Social History:   Social History   Socioeconomic History   Marital status: Married    Spouse name: Not on file   Number of children: Not on file   Years of education: Not on file   Highest education level: Not on file  Occupational History   Not on file  Tobacco Use   Smoking status: Every Day    Current packs/day: 1.00    Average packs/day: 1 pack/day for 52.0 years (52.0 ttl pk-yrs)    Types: Cigarettes   Smokeless tobacco: Not on file  Substance and Sexual Activity   Alcohol  use: No   Drug use: No   Sexual activity: Not on file  Other Topics Concern   Not on file  Social History Narrative   Not on file   Social Drivers of Health   Financial Resource Strain: Not on file  Food Insecurity: Not on file  Transportation Needs: Not on file  Physical Activity: Not on file  Stress: Not on file  Social Connections: Unknown (08/21/2021)   Received from [facility], [facility]   Social Network    Social Network: Not on file  Intimate Partner Violence:  Unknown (07/14/2021)   Received from [facility], [facility]   HITS    Physically Hurt: Not on file    Insult or Talk Down To: Not on file    Threaten Physical Harm: Not on file    Scream or Curse: Not on file    Family History:   Family History  Problem Relation Age of Onset   Heart attack Mother    COPD Father     ROS:  Please see the history of present illness.  All other ROS reviewed and negative.     Physical Exam/Data:   Vitals:   08/04/23 1100 08/04/23 1115 08/04/23 1130 08/04/23 1245  BP: 96/63 105/64 119/67 119/73  Pulse:   88 83  Resp: 19 19 14  (!) 23  Temp:   97.9 F (36.6 C) 97.6 F (36.4 C)  TempSrc:   Oral Oral  SpO2:   90% 91%   No intake or output data in the 24 hours ending 08/04/23 1247    12/25/2017    3:11 PM 03/30/2016    5:00 AM 11/14/2014    9:14 PM  Last 3 Weights  Weight (lbs) 160 lb 164 lb 3.9 oz 166 lb 14.2 oz  Weight (kg) 72.576 kg 74.5 kg 75.7 kg     There is no height or weight on file to calculate BMI.   General: chronically ill-appearing elderly man, on room air HEENT: normal Neck: no JVD Vascular: No carotid bruits; Distal pulses 2+ bilaterally Cardiac:  normal S1, S2; irregularly irregular rhythm; no murmur  Lungs:  clear to auscultation bilaterally, no wheezing, rhonchi or rales  Abd: soft, nontender, no hepatomegaly  Ext: no edema Musculoskeletal:  No deformities Skin: warm and dry  Neuro:  no focal abnormalities noted   EKG:  The EKG was personally reviewed and demonstrates: Atrial fibrillation with RVR, heart rate 142 bpm  Telemetry:  Telemetry was personally reviewed and demonstrates: Rate controlled atrial fibrillation, heart rate 80s  Relevant CV Studies:  Echocardiogram, 08/04/2023  IMPRESSIONS  Left ventricular ejection fraction, by estimation, is 50 to 55% . The left ventricle has low normal function. Left ventricular endocardial border not optimally defined to evaluate regional wall motion but suspect apical  hypokinesis. Left ventricular diastolic parameters are indeterminate.  Right ventricular systolic function is normal. The right ventricular size is normal. Tricuspid regurgitation signal is inadequate for assessing PA pressure.  The mitral valve is normal in structure. No evidence of mitral valve regurgitation. No evidence of mitral stenosis.  The aortic valve is tricuspid. There is mild calcification of the aortic valve. Aortic valve regurgitation is not visualized. No aortic stenosis is present.  The inferior vena cava is normal in size with < 50% respiratory variability, suggesting right atrial pressure of 8 mmHg. Technically difficult study with poor acoustic windows.  Laboratory Data:  High Sensitivity Troponin:  No results for input(s): "TROPONINIHS" in the last 720 hours.   Chemistry Recent Labs  Lab 08/02/23 2254 08/03/23 0423 08/04/23 0337  NA 134* 138 138  K 4.4 4.0 3.2*  CL 100 103 102  CO2 21* 22 23  GLUCOSE 333* 294* 231*  BUN 14 13 9   CREATININE 0.97 0.95 0.72  CALCIUM  9.7 9.0 8.9  MG  --   --  1.5*  GFRNONAA >60 >60 >60  ANIONGAP 13 13 13     Recent Labs  Lab 08/02/23 2254  PROT 7.3  ALBUMIN 3.7  AST 17  ALT 15  ALKPHOS 41  BILITOT 1.2   Lipids No results for input(s): "CHOL", "TRIG", "HDL", "LABVLDL", "LDLCALC", "CHOLHDL" in the last 168 hours.  Hematology Recent Labs  Lab 08/02/23 2254 08/03/23 0423 08/04/23 0337  WBC 15.7* 11.9* 10.4  RBC 5.28 4.76 5.12  HGB 16.5 14.5 16.2  HCT 47.9 43.6 46.9  MCV 90.7 91.6 91.6  MCH 31.3 30.5 31.6  MCHC 34.4 33.3 34.5  RDW 13.2 13.2 13.1  PLT 214 204 213   Thyroid No results for input(s): "TSH", "FREET4" in the last 168 hours.  BNPNo results for input(s): "BNP", "PROBNP" in the last 168 hours.  DDimer No results for input(s): "DDIMER" in the last 168 hours.  Radiology/Studies:  ECHOCARDIOGRAM COMPLETE Result Date: 08/04/2023    ECHOCARDIOGRAM REPORT   Patient Name:   Bruce Franklin Date of Exam: 08/04/2023  Medical Rec #:  [id number]  Height:       75.0 in Accession #:    [id number]     Weight:       160.0 lb Date of Birth:  [birth year]     BSA:          1.996 m Patient Age:    77 years       BP:           113/69 mmHg Patient Gender: M              HR:           115 bpm. Exam Location:  Inpatient Procedure: 2D Echo, Cardiac Doppler, Color Doppler and Intracardiac            Opacification Agent (Both Spectral and Color Flow Doppler were            utilized during procedure). Indications:    Atrial fibrillation  History:        Patient has no prior history of Echocardiogram examinations.                 CAD, COPD; Risk Factors:Diabetes and Hypertension.  Sonographer:    Aldon Ambrosia Referring Phys: [id number] ERIC CHEN IMPRESSIONS  1. Left ventricular ejection fraction, by estimation, is 50 to 55%. The left ventricle has low normal function. Left ventricular endocardial border not optimally defined to evaluate regional wall motion but suspect apical hypokinesis. Left ventricular diastolic parameters are indeterminate.  2. Right ventricular systolic function is normal. The right ventricular size is normal. Tricuspid regurgitation signal is inadequate for assessing PA pressure.  3. The mitral valve is normal in structure. No evidence of mitral valve regurgitation. No evidence of mitral stenosis.  4. The aortic valve is tricuspid. There is mild calcification of the aortic valve. Aortic valve regurgitation is not visualized. No aortic stenosis is present.  5. The inferior vena cava is normal in size with <50% respiratory variability, suggesting right atrial pressure of 8 mmHg.  6. Technically difficult study with poor acoustic windows. FINDINGS  Left Ventricle: Left ventricular ejection fraction, by estimation, is 50 to 55%. The left ventricle has low normal function. Left ventricular endocardial border not optimally defined to evaluate regional wall motion. The left ventricular internal cavity  size was normal in size. There  is no left ventricular hypertrophy. Left ventricular diastolic parameters are indeterminate. Right Ventricle: The right ventricular size is normal. No increase in right ventricular wall thickness. Right ventricular systolic function is normal. Tricuspid regurgitation signal is inadequate for assessing PA pressure. Left [facility]: Left atrial size was normal in size. Right [facility]: Right atrial size was normal in size. Pericardium: There is no evidence of pericardial effusion. Mitral Valve: The mitral valve is normal in structure. No evidence of mitral valve regurgitation. No evidence of mitral valve stenosis. MV peak gradient, 1.1 mmHg. The mean mitral valve gradient is 1.0 mmHg. Tricuspid Valve: The tricuspid valve is normal in structure. Tricuspid valve regurgitation is not demonstrated. Aortic Valve: The aortic valve is tricuspid. There is mild calcification of the aortic valve. Aortic valve regurgitation is not visualized. No aortic stenosis is present. Aortic valve mean gradient measures 1.0 mmHg. Aortic valve peak gradient measures 2.3 mmHg. Aortic valve area, by VTI measures 3.39 cm. Pulmonic Valve: The pulmonic valve was normal in structure. Pulmonic valve regurgitation is not visualized. Aorta: The aortic root is normal in size and structure. Venous: The inferior vena cava is normal in size with less than 50% respiratory variability, suggesting  right atrial pressure of 8 mmHg. IAS/Shunts: The interatrial septum was not well visualized.  LEFT VENTRICLE PLAX 2D LVIDd:         5.40 cm     Diastology LVIDs:         3.50 cm     LV e' medial:    6.85 cm/s LV PW:         0.70 cm     LV E/e' medial:  7.3 LV IVS:        0.80 cm     LV e' lateral:   11.90 cm/s LVOT diam:     2.10 cm     LV E/e' lateral: 4.2 LV SV:         33 LV SV Index:   17 LVOT Area:     3.46 cm  LV Volumes (MOD) LV vol d, MOD A2C: 83.1 ml LV vol d, MOD A4C: 63.1 ml LV vol s, MOD A2C: 23.8 ml LV vol s, MOD A4C: 26.9 ml LV SV MOD A2C:     59.3 ml LV  SV MOD A4C:     63.1 ml LV SV MOD BP:      50.3 ml RIGHT VENTRICLE             IVC RV Basal diam:  3.70 cm     IVC diam: 1.70 cm RV Mid diam:    2.40 cm RV S prime:     13.10 cm/s LEFT ATRIUM           Index        RIGHT ATRIUM           Index LA diam:      3.20 cm 1.60 cm/m   RA Area:     10.50 cm LA Vol (A4C): 30.4 ml 15.23 ml/m  RA Volume:   23.40 ml  11.72 ml/m  AORTIC VALVE                    PULMONIC VALVE AV Area (Vmax):    3.19 cm     PV Vmax:       0.99 m/s AV Area (Vmean):   3.30 cm     PV Peak grad:  3.9 mmHg AV Area (VTI):     3.39 cm AV Vmax:           76.45 cm/s AV Vmean:          54.550 cm/s AV VTI:            0.098 m AV Peak Grad:      2.3 mmHg AV Mean Grad:      1.0 mmHg LVOT Vmax:         70.40 cm/s LVOT Vmean:        52.000 cm/s LVOT VTI:          0.096 m LVOT/AV VTI ratio: 0.98  AORTA Ao Root diam: 3.50 cm Ao Asc diam:  2.70 cm MITRAL VALVE MV Area (PHT): 4.65 cm    SHUNTS MV Area VTI:   4.29 cm    Systemic VTI:  0.10 m MV Peak grad:  1.1 mmHg    Systemic Diam: 2.10 cm MV Mean grad:  1.0 mmHg MV Vmax:       0.52 m/s MV Vmean:      33.0 cm/s MV Decel Time: 163 msec MV E velocity: 50.30 cm/s Dalton McleanMD Electronically signed by Archer Bear Signature Date/Time: 08/04/2023/8:27:31 AM    Final  DG Chest Portable 1 View Result Date: 08/02/2023 CLINICAL DATA:  Weakness EXAM: PORTABLE CHEST 1 VIEW COMPARISON:  07/25/2019 FINDINGS: Emphysema. Mild ground-glass infiltrate in the left lung base. No pleural effusion. Normal cardiac size. No pneumothorax IMPRESSION: Emphysema. Mild ground-glass infiltrate in the left lung base, probable pneumonia. Imaging follow-up to resolution is recommended Electronically Signed   By: Esmeralda Hedge M.D.   On: 08/02/2023 23:04   Assessment and Plan:   Paroxysmal atrial fibrillation with RVR Patient has prior history of A. Fib, appears remote after an ERCP in 2021 Saw [facility] cardiology 11/2021, was in NSR Noted to have stopped all GDMT and  anticoagulation because he wanted to rely on his faith over medical therapy Presented to the hospital and was found to have sepsis due to PNA  Noted to be in rapid A. Fib this morning, started on IV diltiazem  and IV heparin  Currently in atrial fibrillation, with HR 80s CHA2DS2-VASc score of 6 (age, CHF, HTN, DM, history of MI) Consider stopping IV diltiazem  and switching to metoprolol  succinate 50 mg daily Discontinue IV heparin  and start Eliquis  5 mg BID Patient will be set to transfer care to [facility] cardiology -- if he demonstrates compliance with his medications, we can consider possible cardioversion in the future if he remains in A. Fib   Chronic HFmrEF Previously noted to have EF as low as 20-25% in 2019 in the setting of STEMI, EF then recovered to 50-55% since 2021, remains recovered now Echo this admission: EF 50-55%, normal RV function, normal IVC Patient was previously on GDMT but stopped taking them as he would like to focus on his faith over medical therapy  Patient has history of noncompliance with medications but states he would be willing to try them now Medication changes as above   Hyperlipidemia 08/02/2023: ALT 15  Lipid panel from 2021 showed LDL of 125 Patient was previously on Crestor and Lipitor but also stopped on his own  Consider starting Crestor 40 mg daily at discharge or at follow up appointment   Per primary Sepsis 2/2 to PNA CAD COPD Diabetes   Risk Assessment/Risk Scores:      New York  Heart Association (NYHA) Functional Class NYHA Class II  CHA2DS2-VASc Score = 6   This indicates a 9.7% annual risk of stroke. The patient's score is based upon: CHF History: 1 HTN History: 1 Diabetes History: 1 Stroke History: 0 Vascular Disease History: 1 Age Score: 2 Gender Score: 0   For questions or updates, please contact [facility] Please consult [email or web address] for contact info under  Signed, Jiles Mote, PA-C  08/04/2023 12:47  PM  Patient seen and examined, note reviewed with the signed Advanced Practice Provider. I personally reviewed laboratory data, imaging studies and relevant notes. I independently examined the patient and formulated the important aspects of the plan. I have personally discussed the plan with the patient and/or family. Comments or changes to the note/plan are indicated below.  Paroxysmal atrial fibrillation with RVR  Chronic heart failure with mid range Ejection fraction  Hyperlipidemia   Know hx of PAF - he stopped taking his medications for personal reasons and has not restarted. He is admitted for PNA now found to be in atrial fibrillation. Thankfully he is rate control today. For now though it will be best to stick with rate control strategy while inpatient and reasses the need for rhythm control since he is not reliable on continuing his medications.   He will benefit from transitioning  from heparin  gtt to Eliquis  5 mg BID.   Thankfully his EF is low normal which is an improvement compared to prior outside study.   Abx per primary team    Jerryl Morin DO, MS [facility] Attending Cardiologist [facility]  [address] [place], [place] [address] [phone] Website: [email or web address]

## 2023-08-04 NOTE — Telephone Encounter (Signed)
 Patient Product/process development scientist completed.    The patient is insured through [employer]. Patient has Medicare and is not eligible for a copay card, but may be able to apply for patient assistance or Medicare RX Payment Plan (Patient Must reach out to their plan, if eligible for payment plan), if available.    Ran test claim for Eliquis 5 mg and the current 30 day co-pay is $12.15.   This test claim was processed through [facility]- copay amounts may vary at other pharmacies due to pharmacy/plan contracts, or as the patient moves through the different stages of their insurance plan.     Roland Earl, CPHT Pharmacy Technician III Certified Patient Advocate [facility] Patient Advocate Team Direct Number: [phone]  Fax: [phone]

## 2023-08-04 NOTE — ED Notes (Signed)
 Help get patient straighten up in the bed patient has call bell in reach

## 2023-08-04 NOTE — TOC Initial Note (Signed)
 Transition of Care [facility]) - Initial/Assessment Note    Patient Details  Name: Bruce Franklin MRN: [id number] Date of Birth: March 08, 1946  Transition of Care [facility]) CM/SW Contact:    Arron Big, LCSWA Phone Number: 08/04/2023, 3:38 PM  Clinical Narrative:    CSW met patient and spouse at bedside to discuss PT recs for SNF. Patient was lethargic upon CSWs entry and fell asleep during the conversation. Patients spouse states that she is the caregiver and she will be taking care of patient at home. [name], pts spouse, stated that if she perceives they need assistance that she will reach out.  She reports patient has reliable transportation, a PCP, DME (walker, cane, shower chair, bedside commode), and [facility]. She reports they have no other needs at this time.   TOC will continue to follow.       Expected Discharge Plan: Home/Self Care Barriers to Discharge: Continued Medical Work up   Patient Goals and CMS Choice Patient states their goals for this hospitalization and ongoing recovery are:: To go home          Expected Discharge Plan and Services In-house Referral: Clinical Social Work     Living arrangements for the past 2 months: Single Family Home                                      Prior Living Arrangements/Services Living arrangements for the past 2 months: Single Family Home Lives with:: Spouse Patient language and need for interpreter reviewed:: Yes Do you feel safe going back to the place where you live?: Yes      Need for Family Participation in Patient Care: Yes (Comment) Care giver support system in place?: Yes (comment) Current home services: DME (RW, shower chair, bedside commode) Criminal Activity/Legal Involvement Pertinent to Current Situation/Hospitalization: No - Comment as needed  Activities of Daily Living      Permission Sought/Granted Permission sought to share information with : Family Supports    Share Information with  NAME: [name]     Permission granted to share info w Relationship: Spouse  Permission granted to share info w Contact Information: [phone] (Mobile)  Emotional Assessment Appearance:: Appears stated age Attitude/Demeanor/Rapport: Lethargic Affect (typically observed): Flat Orientation: : Oriented to Place, Oriented to Self, Oriented to  Time Alcohol  / Substance Use: Not Applicable Psych Involvement: No (comment)  Admission diagnosis:  Rapid atrial fibrillation (HCC) [I48.91] Generalized weakness [R53.1] Sepsis due to pneumonia (HCC) [J18.9, A41.9] Pneumonia of left lower lobe due to infectious organism [J18.9] Patient Active Problem List   Diagnosis Date Noted   Sepsis due to pneumonia (HCC) 08/03/2023   CAD (coronary artery disease) 08/03/2023   Chronic heart failure with mildly reduced ejection fraction (HFmrEF, 41-49%) (HCC) 08/03/2023   Chronic prescription opiate use 08/03/2023   Rapid atrial fibrillation (HCC) 08/03/2023   Benign essential hypertension 08/15/2018   COPD (chronic obstructive pulmonary disease) (HCC)    Diabetes mellitus without complication (HCC)    Prostate cancer (HCC)    Type 2 diabetes mellitus without complication (HCC)    Malnutrition of moderate degree (HCC) 11/15/2014   TOBACCO DEPENDENCE 06/09/2006   NEPHROLITHIASIS 06/09/2006   BACK PAIN, LOW 06/09/2006   PCP:  [facility]:   [facility] [place] [place] - [address] [place] [address] Phone: [phone] Fax: [phone]  [facility] [phone]  [place], [place] - [address] [place] [address] Phone: [phone] Fax: [phone]     Social Drivers of Health (SDOH) Social History: SDOH Screenings   Food Insecurity: No Food Insecurity (08/04/2023)  Housing: High Risk (08/04/2023)  Transportation Needs: Unmet Transportation Needs (08/04/2023)  Utilities: At Risk (08/04/2023)  Social Connections: Socially  Isolated (08/04/2023)  Tobacco Use: High Risk (08/03/2023)   SDOH Interventions:     Readmission Risk Interventions     No data to display

## 2023-08-04 NOTE — Progress Notes (Signed)
  Echocardiogram 2D Echocardiogram has been performed.  Bruce Franklin L Mariko Nowakowski RDCS 08/04/2023, 8:23 AM

## 2023-08-04 NOTE — Progress Notes (Addendum)
 PROGRESS NOTE    Bruce Franklin  WUJ:[id number] DOB: 10/06/1945 DOA: 08/02/2023 PCP: [facility]  Subjective: Pt seen and examined. Remains on RA. Pt went into rapid afib last night. Started on IV heparin  and IV cardizem .  In reviewing his EMR, pt did have episode of PAF when he was admitted to [facility] in 07-2019 when he had ERCP.  He did see cardiology with [facility] in 11-2021. He has a hx of chronic systolic CHF, apical LV thrombus. Pt self-discontinued all of his CHF meds and systemic anticoagulation. He was lost to cardiology follow up since 11-2021.  Echo tech at bedside for echo.  Telemetry continues to show rapid afib. On cardizem  IV gtts and IV heparin  gtts.   Hospital Course: HPI: Bruce Franklin is a 78 y.o. male with medical history significant for hypertension, hyperlipidemia, CAD, chronic HFmrEF, COPD, and diabetes mellitus who presents with productive cough, fatigue, and confusion.   Patient's wife has become concerned due to the patient's worsening fatigue, confusion, and productive cough.  Symptoms have been worsening over the course of several days.  No fevers have been noted at home but his blood sugar has been higher than usual.     ED Course: Upon arrival to the ED, patient is found to be afebrile and saturating 90% on room air with tachypnea, tachycardia, and stable BP.  Labs are most notable for glucosuria 33, WBC 15,700, lactic acid 2.1, and negative respiratory virus panel.  Chest x-ray is concerning for pneumonia.   Blood cultures were collected and the patient was given a liter of saline, Rocephin , and azithromycin .  Significant Events: Admitted 08/02/2023 for pneumonia   Significant Labs: WBC 15.7, HgB 16.5, plt 214 Lactic acid 2.1 Na 134, k 4.4, CO2 of 21, BUN 32, Scr 0.97, glu 333 Covid/flu/RSV negative   Significant Imaging Studies: CXR Emphysema. Mild ground-glass infiltrate in the left lung base, probable pneumonia. Imaging follow-up to  resolution is recommended  Antibiotic Therapy: Anti-infectives (From admission, onward)    Start     Dose/Rate Route Frequency Ordered Stop   08/03/23 1800  azithromycin  (ZITHROMAX ) tablet 500 mg        500 mg Oral Daily 08/03/23 0349 08/07/23 0959   08/03/23 1000  cefTRIAXone  (ROCEPHIN ) 2 g in sodium chloride  0.9 % 100 mL IVPB        2 g 200 mL/hr over 30 Minutes Intravenous Every 24 hours 08/03/23 0349 08/07/23 0959   08/03/23 0015  cefTRIAXone  (ROCEPHIN ) 1 g in sodium chloride  0.9 % 100 mL IVPB        1 g 200 mL/hr over 30 Minutes Intravenous  Once 08/03/23 0005 08/03/23 0123   08/03/23 0015  azithromycin  (ZITHROMAX ) 500 mg in sodium chloride  0.9 % 250 mL IVPB        500 mg 250 mL/hr over 60 Minutes Intravenous  Once 08/03/23 0005 08/03/23 0156       Procedures:   Consultants: cardiology    Assessment and Plan: * Sepsis due to pneumonia (HCC) 08-03-2023 on RA. Sepsis presents on admission due to HR 126, WBC 15.7, lactic acid of 2.1. on IV Rocephin , po zithromax .  Repeat labs in AM. Possible DC to home in AM. PT consult.  08-04-2023 procal slightly elevated at 0.23.  on RA. Continue with IV rocephin /zithromax . He is not wheezing. No role of steroids.  Rapid atrial fibrillation (HCC) 08-04-2023 went into rapid afib yesterday. Started on IV cardizem . Echo pending. On IV heparin . Will ask cardiology in  utility of DCCV vs chemical cardioversion given his prior hx of ischemic cardiomyopathy. Echo in 2021 showed partially recovered LVEF of 40%.  Chronic prescription opiate use 08-03-2023 goes to [facility] for opiate Rx. Continue prn oxycodone  10 mg q6h prn.  Chronic heart failure with mildly reduced ejection fraction (HFmrEF, 41-49%) (HCC) 08-03-2023 not on GDMT as outpatient.   CAD (coronary artery disease) 08-03-2023 stable. Cont asa.  COPD (chronic obstructive pulmonary disease) (HCC) 08-03-2023 continue prn duonebs. No need for steroids. Not  exacerbated.  Type 2 diabetes mellitus without complication (HCC) 08-03-2023 continue SSI. Change to Ac/hs. Add lantus  5 units daily.  Lab Results  Component Value Date   HGBA1C 9.4 (H) 08/03/2023    DVT prophylaxis:   IV Heparin    Code Status: Full Code Family Communication: called pt's son Jahmai Finelli [phone]. Gave son update.  Discussed that pt is chronically ill and has failed to follow cardiology recommendations on taking GDMT for his CHF. Disposition Plan: return home Reason for continuing need for hospitalization: remains on IV cardizem  gtts, IV heparin  gtts. Cardiology consulted today. Remains on IV ABX.  Objective: Vitals:   08/04/23 0724 08/04/23 0730 08/04/23 0745 08/04/23 0800  BP:  (!) 148/89 113/69 130/67  Pulse:  (!) 105 (!) 117 (!) 110  Resp:   17 (!) 21  Temp: 97.7 F (36.5 C)     TempSrc: Oral     SpO2:  94% 93% 92%   No intake or output data in the 24 hours ending 08/04/23 0830 There were no vitals filed for this visit.  Examination:  Physical Exam Vitals and nursing note reviewed.  HENT:     Head: Normocephalic and atraumatic.     Nose: Nose normal.  Eyes:     General: No scleral icterus. Cardiovascular:     Rate and Rhythm: Tachycardia present. Rhythm irregular.  Pulmonary:     Effort: No respiratory distress.     Breath sounds: No wheezing.  Abdominal:     General: Abdomen is flat. Bowel sounds are normal. There is no distension.  Musculoskeletal:     Right lower leg: No edema.     Left lower leg: No edema.  Skin:    General: Skin is warm and dry.     Capillary Refill: Capillary refill takes less than 2 seconds.  Neurological:     General: No focal deficit present.     Mental Status: He is alert.     Comments: Very soft spoken     Data Reviewed: I have personally reviewed following labs and imaging studies  CBC: Recent Labs  Lab 08/02/23 2254 08/03/23 0423 08/04/23 0337  WBC 15.7* 11.9* 10.4  NEUTROABS 14.2*  --  7.8*   HGB 16.5 14.5 16.2  HCT 47.9 43.6 46.9  MCV 90.7 91.6 91.6  PLT 214 204 213   Basic Metabolic Panel: Recent Labs  Lab 08/02/23 2254 08/03/23 0423 08/04/23 0337  NA 134* 138 138  K 4.4 4.0 3.2*  CL 100 103 102  CO2 21* 22 23  GLUCOSE 333* 294* 231*  BUN 14 13 9   CREATININE 0.97 0.95 0.72  CALCIUM  9.7 9.0 8.9  MG  --   --  1.5*   GFR: CrCl cannot be calculated (Unknown ideal weight.). Liver Function Tests: Recent Labs  Lab 08/02/23 2254  AST 17  ALT 15  ALKPHOS 41  BILITOT 1.2  PROT 7.3  ALBUMIN 3.7   HbA1C: Recent Labs    08/03/23 0423  HGBA1C 9.4*   CBG: Recent Labs  Lab 08/03/23 0819 08/03/23 1253 08/03/23 1703 08/03/23 2132 08/04/23 0344  GLUCAP 192* 242* 204* 213* 185*   Sepsis Labs: Recent Labs  Lab 08/02/23 2254 08/03/23 0040 08/03/23 0820 08/04/23 0337  PROCALCITON  --   --   --  0.23  LATICACIDVEN 2.1* 3.0* 1.5  --     Recent Results (from the past 240 hours)  Resp panel by RT-PCR (RSV, Flu A&B, Covid) Anterior Nasal Swab     Status: None   Collection Time: 08/02/23 10:40 PM   Specimen: Anterior Nasal Swab  Result Value Ref Range Status   SARS Coronavirus 2 by RT PCR NEGATIVE NEGATIVE Final   Influenza A by PCR NEGATIVE NEGATIVE Final   Influenza B by PCR NEGATIVE NEGATIVE Final    Comment: (NOTE) The Xpert Xpress SARS-CoV-2/FLU/RSV plus assay is intended as an aid in the diagnosis of influenza from Nasopharyngeal swab specimens and should not be used as a sole basis for treatment. Nasal washings and aspirates are unacceptable for Xpert Xpress SARS-CoV-2/FLU/RSV testing.  Fact Sheet for Patients: BloggerCourse.com  Fact Sheet for Healthcare Providers: SeriousBroker.it  This test is not yet approved or cleared by the [place]  FDA and has been authorized for detection and/or diagnosis of SARS-CoV-2 by FDA under an Emergency Use Authorization (EUA). This EUA will remain in  effect (meaning this test can be used) for the duration of the COVID-19 declaration under Section 564(b)(1) of the Act, 21 U.S.C. section 360bbb-3(b)(1), unless the authorization is terminated or revoked.     Resp Syncytial Virus by PCR NEGATIVE NEGATIVE Final    Comment: (NOTE) Fact Sheet for Patients: BloggerCourse.com  Fact Sheet for Healthcare Providers: SeriousBroker.it  This test is not yet approved or cleared by the [place]  FDA and has been authorized for detection and/or diagnosis of SARS-CoV-2 by FDA under an Emergency Use Authorization (EUA). This EUA will remain in effect (meaning this test can be used) for the duration of the COVID-19 declaration under Section 564(b)(1) of the Act, 21 U.S.C. section 360bbb-3(b)(1), unless the authorization is terminated or revoked.  Performed at [facility], [address], [place], [place] [address]      Radiology Studies: ECHOCARDIOGRAM COMPLETE Result Date: 08/04/2023    ECHOCARDIOGRAM REPORT   Patient Name:   Bruce Franklin Date of Exam: 08/04/2023 Medical Rec #:  [id number]      Height:       75.0 in Accession #:    [id number]     Weight:       160.0 lb Date of Birth:  01/26/1946     BSA:          1.996 m Patient Age:    77 years       BP:           113/69 mmHg Patient Gender: M              HR:           115 bpm. Exam Location:  Inpatient Procedure: 2D Echo, Cardiac Doppler, Color Doppler and Intracardiac            Opacification Agent (Both Spectral and Color Flow Doppler were            utilized during procedure). Indications:    Atrial fibrillation  History:        Patient has no prior history of Echocardiogram examinations.  CAD, COPD; Risk Factors:Diabetes and Hypertension.  Sonographer:    Aldon Ambrosia Referring Phys: [id number] Neidra Girvan IMPRESSIONS  1. Left ventricular ejection fraction, by estimation, is 50 to 55%. The left ventricle has low normal function.  Left ventricular endocardial border not optimally defined to evaluate regional wall motion but suspect apical hypokinesis. Left ventricular diastolic parameters are indeterminate.  2. Right ventricular systolic function is normal. The right ventricular size is normal. Tricuspid regurgitation signal is inadequate for assessing PA pressure.  3. The mitral valve is normal in structure. No evidence of mitral valve regurgitation. No evidence of mitral stenosis.  4. The aortic valve is tricuspid. There is mild calcification of the aortic valve. Aortic valve regurgitation is not visualized. No aortic stenosis is present.  5. The inferior vena cava is normal in size with <50% respiratory variability, suggesting right atrial pressure of 8 mmHg.  6. Technically difficult study with poor acoustic windows. FINDINGS  Left Ventricle: Left ventricular ejection fraction, by estimation, is 50 to 55%. The left ventricle has low normal function. Left ventricular endocardial border not optimally defined to evaluate regional wall motion. The left ventricular internal cavity  size was normal in size. There is no left ventricular hypertrophy. Left ventricular diastolic parameters are indeterminate. Right Ventricle: The right ventricular size is normal. No increase in right ventricular wall thickness. Right ventricular systolic function is normal. Tricuspid regurgitation signal is inadequate for assessing PA pressure. Left [facility]: Left atrial size was normal in size. Right [facility]: Right atrial size was normal in size. Pericardium: There is no evidence of pericardial effusion. Mitral Valve: The mitral valve is normal in structure. No evidence of mitral valve regurgitation. No evidence of mitral valve stenosis. MV peak gradient, 1.1 mmHg. The mean mitral valve gradient is 1.0 mmHg. Tricuspid Valve: The tricuspid valve is normal in structure. Tricuspid valve regurgitation is not demonstrated. Aortic Valve: The aortic valve is tricuspid. There  is mild calcification of the aortic valve. Aortic valve regurgitation is not visualized. No aortic stenosis is present. Aortic valve mean gradient measures 1.0 mmHg. Aortic valve peak gradient measures 2.3 mmHg. Aortic valve area, by VTI measures 3.39 cm. Pulmonic Valve: The pulmonic valve was normal in structure. Pulmonic valve regurgitation is not visualized. Aorta: The aortic root is normal in size and structure. Venous: The inferior vena cava is normal in size with less than 50% respiratory variability, suggesting right atrial pressure of 8 mmHg. IAS/Shunts: The interatrial septum was not well visualized.  LEFT VENTRICLE PLAX 2D LVIDd:         5.40 cm     Diastology LVIDs:         3.50 cm     LV e' medial:    6.85 cm/s LV PW:         0.70 cm     LV E/e' medial:  7.3 LV IVS:        0.80 cm     LV e' lateral:   11.90 cm/s LVOT diam:     2.10 cm     LV E/e' lateral: 4.2 LV SV:         33 LV SV Index:   17 LVOT Area:     3.46 cm  LV Volumes (MOD) LV vol d, MOD A2C: 83.1 ml LV vol d, MOD A4C: 63.1 ml LV vol s, MOD A2C: 23.8 ml LV vol s, MOD A4C: 26.9 ml LV SV MOD A2C:     59.3 ml LV SV MOD A4C:  63.1 ml LV SV MOD BP:      50.3 ml RIGHT VENTRICLE             IVC RV Basal diam:  3.70 cm     IVC diam: 1.70 cm RV Mid diam:    2.40 cm RV S prime:     13.10 cm/s LEFT ATRIUM           Index        RIGHT ATRIUM           Index LA diam:      3.20 cm 1.60 cm/m   RA Area:     10.50 cm LA Vol (A4C): 30.4 ml 15.23 ml/m  RA Volume:   23.40 ml  11.72 ml/m  AORTIC VALVE                    PULMONIC VALVE AV Area (Vmax):    3.19 cm     PV Vmax:       0.99 m/s AV Area (Vmean):   3.30 cm     PV Peak grad:  3.9 mmHg AV Area (VTI):     3.39 cm AV Vmax:           76.45 cm/s AV Vmean:          54.550 cm/s AV VTI:            0.098 m AV Peak Grad:      2.3 mmHg AV Mean Grad:      1.0 mmHg LVOT Vmax:         70.40 cm/s LVOT Vmean:        52.000 cm/s LVOT VTI:          0.096 m LVOT/AV VTI ratio: 0.98  AORTA Ao Root diam: 3.50 cm Ao  Asc diam:  2.70 cm MITRAL VALVE MV Area (PHT): 4.65 cm    SHUNTS MV Area VTI:   4.29 cm    Systemic VTI:  0.10 m MV Peak grad:  1.1 mmHg    Systemic Diam: 2.10 cm MV Mean grad:  1.0 mmHg MV Vmax:       0.52 m/s MV Vmean:      33.0 cm/s MV Decel Time: 163 msec MV E velocity: 50.30 cm/s Dalton McleanMD Electronically signed by Archer Bear Signature Date/Time: 08/04/2023/8:27:31 AM    Final    DG Chest Portable 1 View Result Date: 08/02/2023 CLINICAL DATA:  Weakness EXAM: PORTABLE CHEST 1 VIEW COMPARISON:  07/25/2019 FINDINGS: Emphysema. Mild ground-glass infiltrate in the left lung base. No pleural effusion. Normal cardiac size. No pneumothorax IMPRESSION: Emphysema. Mild ground-glass infiltrate in the left lung base, probable pneumonia. Imaging follow-up to resolution is recommended Electronically Signed   By: Esmeralda Hedge M.D.   On: 08/02/2023 23:04    Scheduled Meds:  azithromycin   500 mg Oral Daily   insulin  aspart  0-5 Units Subcutaneous QHS   insulin  aspart  0-9 Units Subcutaneous TID WC   insulin  glargine-yfgn  5 Units Subcutaneous Daily   potassium chloride   40 mEq Oral Q4H   sodium chloride  flush  3 mL Intravenous Q12H   Continuous Infusions:  cefTRIAXone  (ROCEPHIN )  IV Stopped (08/03/23 1230)   diltiazem  (CARDIZEM ) infusion 15 mg/hr (08/04/23 0311)   heparin  1,200 Units/hr (08/04/23 0506)   magnesium  sulfate bolus IVPB     potassium chloride        LOS: 1 day   Time spent: 45 minutes  Unk Garb, DO  [facility]  08/04/2023, 8:30 AM

## 2023-08-05 ENCOUNTER — Inpatient Hospital Stay (HOSPITAL_COMMUNITY)

## 2023-08-05 DIAGNOSIS — J189 Pneumonia, unspecified organism: Secondary | ICD-10-CM | POA: Diagnosis not present

## 2023-08-05 DIAGNOSIS — I2510 Atherosclerotic heart disease of native coronary artery without angina pectoris: Secondary | ICD-10-CM | POA: Diagnosis not present

## 2023-08-05 DIAGNOSIS — I480 Paroxysmal atrial fibrillation: Secondary | ICD-10-CM | POA: Diagnosis not present

## 2023-08-05 DIAGNOSIS — I4891 Unspecified atrial fibrillation: Secondary | ICD-10-CM | POA: Diagnosis not present

## 2023-08-05 DIAGNOSIS — E785 Hyperlipidemia, unspecified: Secondary | ICD-10-CM | POA: Diagnosis not present

## 2023-08-05 DIAGNOSIS — I5022 Chronic systolic (congestive) heart failure: Secondary | ICD-10-CM | POA: Diagnosis not present

## 2023-08-05 LAB — LEGIONELLA PNEUMOPHILA SEROGP 1 UR AG: L. pneumophila Serogp 1 Ur Ag: NEGATIVE

## 2023-08-05 LAB — CBC WITH DIFFERENTIAL/PLATELET
Abs Immature Granulocytes: 0.07 10*3/uL (ref 0.00–0.07)
Basophils Absolute: 0.1 10*3/uL (ref 0.0–0.1)
Basophils Relative: 1 %
Eosinophils Absolute: 0.2 10*3/uL (ref 0.0–0.5)
Eosinophils Relative: 1 %
HCT: 47.2 % (ref 39.0–52.0)
Hemoglobin: 16.2 g/dL (ref 13.0–17.0)
Immature Granulocytes: 1 %
Lymphocytes Relative: 13 %
Lymphs Abs: 1.6 10*3/uL (ref 0.7–4.0)
MCH: 31 pg (ref 26.0–34.0)
MCHC: 34.3 g/dL (ref 30.0–36.0)
MCV: 90.4 fL (ref 80.0–100.0)
Monocytes Absolute: 0.9 10*3/uL (ref 0.1–1.0)
Monocytes Relative: 7 %
Neutro Abs: 9.8 10*3/uL — ABNORMAL HIGH (ref 1.7–7.7)
Neutrophils Relative %: 77 %
Platelets: 235 10*3/uL (ref 150–400)
RBC: 5.22 MIL/uL (ref 4.22–5.81)
RDW: 13.2 % (ref 11.5–15.5)
WBC: 12.6 10*3/uL — ABNORMAL HIGH (ref 4.0–10.5)
nRBC: 0 % (ref 0.0–0.2)

## 2023-08-05 LAB — GLUCOSE, CAPILLARY
Glucose-Capillary: 199 mg/dL — ABNORMAL HIGH (ref 70–99)
Glucose-Capillary: 256 mg/dL — ABNORMAL HIGH (ref 70–99)
Glucose-Capillary: 160 mg/dL — ABNORMAL HIGH (ref 70–99)
Glucose-Capillary: 183 mg/dL — ABNORMAL HIGH (ref 70–99)

## 2023-08-05 LAB — BASIC METABOLIC PANEL WITH GFR
Anion gap: 12 (ref 5–15)
BUN: 9 mg/dL (ref 8–23)
CO2: 21 mmol/L — ABNORMAL LOW (ref 22–32)
Calcium: 8.8 mg/dL — ABNORMAL LOW (ref 8.9–10.3)
Chloride: 102 mmol/L (ref 98–111)
Creatinine, Ser: 0.77 mg/dL (ref 0.61–1.24)
GFR, Estimated: >60 mL/min (ref >60)
Glucose, Bld: 178 mg/dL — ABNORMAL HIGH (ref 70–99)
Potassium: 4 mmol/L (ref 3.5–5.1)
Sodium: 135 mmol/L (ref 135–145)

## 2023-08-05 LAB — PHOSPHORUS: Phosphorus: 3.6 mg/dL (ref 2.5–4.6)

## 2023-08-05 LAB — MAGNESIUM: Magnesium: 2 mg/dL (ref 1.7–2.4)

## 2023-08-05 MED ORDER — OSMOLITE 1.5 CAL PO LIQD
1000.0000 mL | ORAL | Status: AC
  Administered 2023-08-05 – 2023-08-08 (×4): 1000 mL
  Filled 2023-08-05 (×5): qty 1000

## 2023-08-05 MED ORDER — SODIUM CHLORIDE 0.9 % IV SOLN
1.0000 g | INTRAVENOUS | Status: DC
  Administered 2023-08-06 – 2023-08-11 (×6): 1 g via INTRAVENOUS
  Filled 2023-08-05 (×6): qty 10

## 2023-08-05 MED ORDER — INSULIN ASPART 100 UNIT/ML IJ SOLN
0.0000 [IU] | Freq: Four times a day (QID) | INTRAMUSCULAR | Status: DC
  Administered 2023-08-05 – 2023-08-06 (×3): 2 [IU] via SUBCUTANEOUS
  Administered 2023-08-06: 3 [IU] via SUBCUTANEOUS
  Administered 2023-08-06: 2 [IU] via SUBCUTANEOUS
  Administered 2023-08-06: 5 [IU] via SUBCUTANEOUS
  Administered 2023-08-07: 1 [IU] via SUBCUTANEOUS
  Administered 2023-08-07: 7 [IU] via SUBCUTANEOUS
  Administered 2023-08-07 – 2023-08-08 (×3): 3 [IU] via SUBCUTANEOUS
  Administered 2023-08-08: 2 [IU] via SUBCUTANEOUS
  Administered 2023-08-08 – 2023-08-09 (×2): 3 [IU] via SUBCUTANEOUS
  Administered 2023-08-09: 2 [IU] via SUBCUTANEOUS

## 2023-08-05 MED ORDER — THIAMINE MONONITRATE 100 MG PO TABS
100.0000 mg | ORAL_TABLET | Freq: Every day | ORAL | Status: AC
  Administered 2023-08-05 – 2023-08-11 (×6): 100 mg
  Filled 2023-08-05 (×6): qty 1

## 2023-08-05 MED ORDER — CEFADROXIL 500 MG PO CAPS
500.0000 mg | ORAL_CAPSULE | Freq: Two times a day (BID) | ORAL | Status: DC
  Administered 2023-08-05: 500 mg via ORAL
  Filled 2023-08-05 (×2): qty 1

## 2023-08-05 MED ORDER — ADULT MULTIVITAMIN W/MINERALS CH
1.0000 | ORAL_TABLET | Freq: Every day | ORAL | Status: DC
  Administered 2023-08-05 – 2023-08-12 (×7): 1
  Filled 2023-08-05 (×7): qty 1

## 2023-08-05 MED ORDER — FREE WATER
100.0000 mL | Freq: Four times a day (QID) | Status: DC
  Administered 2023-08-05 – 2023-08-12 (×23): 100 mL

## 2023-08-05 MED ORDER — PROSOURCE TF20 ENFIT COMPATIBL EN LIQD
60.0000 mL | Freq: Every day | ENTERAL | Status: DC
  Administered 2023-08-05 – 2023-08-12 (×7): 60 mL
  Filled 2023-08-05 (×7): qty 60

## 2023-08-05 MED ORDER — SODIUM CHLORIDE 0.9 % IV SOLN
500.0000 mg | INTRAVENOUS | Status: DC
  Administered 2023-08-06 – 2023-08-10 (×5): 500 mg via INTRAVENOUS
  Filled 2023-08-05 (×5): qty 5

## 2023-08-05 MED ORDER — METOPROLOL TARTRATE 5 MG/5ML IV SOLN
5.0000 mg | Freq: Four times a day (QID) | INTRAVENOUS | Status: DC
  Administered 2023-08-05 – 2023-08-11 (×22): 5 mg via INTRAVENOUS
  Filled 2023-08-05 (×22): qty 5

## 2023-08-05 MED ORDER — ENOXAPARIN SODIUM 80 MG/0.8ML IJ SOSY
65.0000 mg | PREFILLED_SYRINGE | Freq: Two times a day (BID) | INTRAMUSCULAR | Status: DC
  Administered 2023-08-05 – 2023-08-08 (×6): 65 mg via SUBCUTANEOUS
  Filled 2023-08-05 (×6): qty 0.8

## 2023-08-05 MED ORDER — MORPHINE SULFATE (PF) 2 MG/ML IV SOLN
2.0000 mg | INTRAVENOUS | Status: DC | PRN
  Administered 2023-08-06 – 2023-08-10 (×9): 2 mg via INTRAVENOUS
  Filled 2023-08-05 (×9): qty 1

## 2023-08-05 NOTE — Inpatient Diabetes Management (Addendum)
 Inpatient Diabetes Program Recommendations  AACE/ADA: New Consensus Statement on Inpatient Glycemic Control (2015)  Target Ranges:  Prepandial:   less than 140 mg/dL      Peak postprandial:   less than 180 mg/dL (1-2 hours)      Critically ill patients:  140 - 180 mg/dL   Lab Results  Component Value Date   GLUCAP 256 (H) 08/05/2023   HGBA1C 9.4 (H) 08/03/2023    Review of Glycemic Control  Latest Reference Range & Units 08/04/23 16:44 08/04/23 20:41 08/05/23 06:20 08/05/23 10:53  Glucose-Capillary 70 - 99 mg/dL 161 (H) 096 (H) 045 (H) 256 (H)  (H): Data is abnormally high Diabetes history: Type 2 DM Outpatient Diabetes medications: Metformin  1000 mg BID, Ozempic 1 mg qwk Current orders for Inpatient glycemic control: Novolog  0-9 units TID & HS, Semglee  5 units QD  Inpatient Diabetes Program Recommendations:    Consider increasing Semglee  8 units every day  Spoke with patient and wife regarding outpatient diabetes management. Patient is not appropriate for self care of diabetes; wife engaged in conversation. Verified home medications.  Reviewed patient's current A1c of 9.4%. Explained what a A1c is and what it measures. Also reviewed goal A1c with patient, importance of good glucose control @ home, and blood sugar goals. Reviewed patho of DM, need for continued improvement, follow up with PCP, target range goals, when to call MD, survival skills, interventions, checking blood sugar, frequency for checking, and to continue with medications prescribed.  Patient's wife reports no sugary beverages, however, has been eating more carbohydrates and could work to improve this. Reviewed foods that are higher in carbohydrates, importance of protein, and reading nutritional labels.  Secure chat sent to Dr Farrel Hones to discuss potential plan for discharging with insulin , however, MD to continue with outpatient medications.  At discharge, patient will need a meter.   Thanks, Marjo Sievert, MSN,  RNC-OB Diabetes Coordinator [phone] (8a-5p)

## 2023-08-05 NOTE — Progress Notes (Signed)
 Patient saturations 87-88% on 1LNC. RN increased patient's oxygen to [facility]. RN changed patient's gown and patient mentioned to RN "I have something in my throat". RN set up suction and suctioned oral cavity. A piece of sausage and egg was removed. RN made patient and MD aware of findings. Patient is NPO pending speech eval.

## 2023-08-05 NOTE — Progress Notes (Addendum)
 Brief Nutrition Note  Consult received for enteral/tube feeding initiation and management.  Adult Enteral Nutrition Protocol initiated. Full assessment to follow.  Monitor magnesium  and phosphorus daily x 4 occurrences, MD to replete as needed, as pt is at risk for refeeding syndrome. 100 mg thiamine  daily MVI with minerals daily 100 ml free water every 6 hours Advance TF by 10 ml every 12 hours to goal rate, spoke with RN.   Cortrak tube in place with tip gastric.   Admitting Dx: Rapid atrial fibrillation (HCC) [I48.91] Generalized weakness [R53.1] Sepsis due to pneumonia (HCC) [J18.9, A41.9] Pneumonia of left lower lobe due to infectious organism [J18.9]  Body mass index is 18.32 kg/m.   Labs:  Recent Labs  Lab 08/03/23 0423 08/04/23 0337 08/05/23 0402  NA 138 138 135  K 4.0 3.2* 4.0  CL 103 102 102  CO2 22 23 21*  BUN 13 9 9   CREATININE 0.95 0.72 0.77  CALCIUM  9.0 8.9 8.8*  MG  --  1.5* 2.0  GLUCOSE 294* 231* 178*    Taccara Bushnell P., RD, LDN, CNSC See AMiON for contact information

## 2023-08-05 NOTE — Evaluation (Signed)
 Modified Barium Swallow Study  Patient Details  Name: Bruce Franklin MRN: [id number] Date of Birth: 1946-01-26  Today's Date: 08/05/2023  Modified Barium Swallow completed.  Full report located under Chart Review in the Imaging Section.  History of Present Illness Bruce Franklin is a 78 y.o. male with medical history significant for hypertension, hyperlipidemia, CAD, chronic HFmrEF, COPD, and diabetes mellitus who presents with productive cough, fatigue, and confusion. Dx with sepsis due to PNA. REcent admission 08/02/23 for PNA. Note from 4/25 am reports "Patient saturations 87-88% on 1LNC. RN increased patient's oxygen to [facility]. RN changed patient's gown and patient mentioned to RN "I have something in my throat". RN set up suction and suctioned oral cavity. A piece of sausage and egg was removed. RN made patient and MD aware of findings. Patient is NPO pending speech eval."   Clinical Impression Pt has a significant oropharyngeal dysphagia with DIGEST score of 3 suggestive of severe dysfunction. Mentation seems to impact consistency of oral manipulation and transit, sometimes exhibiting brisk responses and other times more repetitive and disorganized. Boluses spill into his pyriform sinuses before the swallow, and sometimes spill over his arytenoids into his airway (PAS 8, silent aspiration). He also has incomplete laryngeal elevation, laryngeal vestibule closure, and epiglottic inversion. Reduced base of tongue retraction and pharyngeal squeeze also lead to residue througout the pharynx and this residue is also aspirated silently (PAS 8). Aspiration occurred regardless of consistency tested. Attempted compensatory strategies but cognitively he was not able to implement them. Recommend that he be NPO. Would place emphasis on oral hygiene to keep up with moisture as much as possible. SLP will follow up for trial of therapy and potential to resume PO diet.  DIGEST Swallow Severity  Rating*  Safety: 3  Efficiency:1  Overall Pharyngeal Swallow Severity: 3 1: mild; 2: moderate; 3: severe; 4: profound  *The Dynamic Imaging Grade of Swallowing Toxicity is standardized for the head and neck cancer population, however, demonstrates promising clinical applications across populations to standardize the clinical rating of pharyngeal swallow safety and severity.  Factors that may increase risk of adverse event in presence of aspiration Roderick Civatte & Jessy Morocco 2021): Poor general health and/or compromised immunity;Respiratory or GI disease;Reduced cognitive function;Frail or deconditioned;Weak cough;Aspiration of thick, dense, and/or acidic materials;Frequent aspiration of large volumes  Swallow Evaluation Recommendations Recommendations: NPO;Alternative means of nutrition - NG Tube Medication Administration: Via alternative means Oral care recommendations: Oral care QID (4x/day) Caregiver Recommendations: Have oral suction available      Beth Brooke., M.A. CCC-SLP Acute Rehabilitation Services Office: [phone]  Secure chat preferred  08/05/2023,4:10 PM

## 2023-08-05 NOTE — Plan of Care (Signed)
 ?  Problem: Elimination: ?Goal: Will not experience complications related to urinary retention ?Outcome: Progressing ?  ?

## 2023-08-05 NOTE — Progress Notes (Signed)
 PROGRESS NOTE    Bruce Franklin  JYN:[id number] DOB: 1945/09/19 DOA: 08/02/2023 PCP: [facility]  Subjective: Pt seen and examined.   Seen by cardiology yesterday. Rate control strategy chosen. On po eliquis  and po Toprol -XL. HR around 105-110.  Pt and wife seen by TOC. Wife refuses for pt to go to SNF.Aaron Aas   Hospital Course: HPI: Bruce Franklin is a 78 y.o. male with medical history significant for hypertension, hyperlipidemia, CAD, chronic HFmrEF, COPD, and diabetes mellitus who presents with productive cough, fatigue, and confusion.   Patient's wife has become concerned due to the patient's worsening fatigue, confusion, and productive cough.  Symptoms have been worsening over the course of several days.  No fevers have been noted at home but his blood sugar has been higher than usual.     ED Course: Upon arrival to the ED, patient is found to be afebrile and saturating 90% on room air with tachypnea, tachycardia, and stable BP.  Labs are most notable for glucosuria 33, WBC 15,700, lactic acid 2.1, and negative respiratory virus panel.  Chest x-ray is concerning for pneumonia.   Blood cultures were collected and the patient was given a liter of saline, Rocephin , and azithromycin .  Significant Events: Admitted 08/02/2023 for pneumonia   Significant Labs: WBC 15.7, HgB 16.5, plt 214 Lactic acid 2.1 Na 134, k 4.4, CO2 of 21, BUN 32, Scr 0.97, glu 333 Covid/flu/RSV negative   Significant Imaging Studies: CXR Emphysema. Mild ground-glass infiltrate in the left lung base, probable pneumonia. Imaging follow-up to resolution is recommended  Antibiotic Therapy: Anti-infectives (From admission, onward)    Start     Dose/Rate Route Frequency Ordered Stop   08/03/23 1800  azithromycin  (ZITHROMAX ) tablet 500 mg        500 mg Oral Daily 08/03/23 0349 08/07/23 0959   08/03/23 1000  cefTRIAXone  (ROCEPHIN ) 2 g in sodium chloride  0.9 % 100 mL IVPB        2 g 200 mL/hr over 30  Minutes Intravenous Every 24 hours 08/03/23 0349 08/07/23 0959   08/03/23 0015  cefTRIAXone  (ROCEPHIN ) 1 g in sodium chloride  0.9 % 100 mL IVPB        1 g 200 mL/hr over 30 Minutes Intravenous  Once 08/03/23 0005 08/03/23 0123   08/03/23 0015  azithromycin  (ZITHROMAX ) 500 mg in sodium chloride  0.9 % 250 mL IVPB        500 mg 250 mL/hr over 60 Minutes Intravenous  Once 08/03/23 0005 08/03/23 0156       Procedures:   Consultants: cardiology    Assessment and Plan: * Sepsis due to pneumonia (HCC) 08-03-2023 on RA. Sepsis presents on admission due to HR 126, WBC 15.7, lactic acid of 2.1. on IV Rocephin , po zithromax .  Repeat labs in AM. Possible DC to home in AM. PT consult.  08-04-2023 procal slightly elevated at 0.23.  on RA. Continue with IV rocephin /zithromax . He is not wheezing. No role of steroids.  08-04-2023 stable. Change to po duricef and zithromax . On RA. From Pneumonia standpoint, pt is stable for DC  Rapid atrial fibrillation (HCC) 08-04-2023 went into rapid afib yesterday. Started on IV cardizem . Echo pending. On IV heparin . Will ask cardiology in utility of DCCV vs chemical cardioversion given his prior hx of ischemic cardiomyopathy. Echo in 2021 showed partially recovered LVEF of 40%.  08-05-2023 echo shows 50-55%. Cards changed pt over to po eliquis  and Toprol -XL 50 mg daily. Awaiting DC clearance from cardiology.  Chronic prescription opiate  use 08-03-2023 goes to [facility] for opiate Rx. Continue prn oxycodone  10 mg q6h prn.  08-05-2023 stable. Due to excessive somnolence, will stop oxycodone  and xanax  for now.  Chronic heart failure with mildly reduced ejection fraction (HFmrEF, 41-49%) (HCC) 08-03-2023 not on GDMT as outpatient.   08-05-2023 stable. LVEF 50-55%. Pt will need f/u with cardiology as outpatient.  CAD (coronary artery disease) 08-03-2023 stable. Cont asa.  08-05-2023 stable   COPD (chronic obstructive pulmonary disease)  (HCC) 08-03-2023 continue prn duonebs. No need for steroids. Not exacerbated.  08-05-2023 stable.  Type 2 diabetes mellitus without complication (HCC) 08-03-2023 continue SSI. Change to Ac/hs. Add lantus  5 units daily.  Lab Results  Component Value Date   HGBA1C 9.4 (H) 08/03/2023   08-05-2023 CBG adequate control. Continue with lantus  and SSI.  DVT prophylaxis:  apixaban  (ELIQUIS ) tablet 5 mg     Code Status: Full Code Family Communication: no family at bedside Disposition Plan: return home. Wife has refused SNF placement for patient. See TOC note from 08-04-2023 Reason for continuing need for hospitalization: awaiting DC clearance from cardiology as pt's wife has refused for pt to be placed into SNF.  Objective: Vitals:   08/04/23 1915 08/04/23 1916 08/04/23 1917 08/05/23 0749  BP: 120/76   (!) 134/55  Pulse:    (!) 106  Resp: 19 (!) 25 (!) 25 20  Temp: 98.1 F (36.7 C)   98.9 F (37.2 C)  TempSrc: Oral   Oral  SpO2:    91%  Weight:      Height:        Intake/Output Summary (Last 24 hours) at 08/05/2023 1007 Last data filed at 08/05/2023 0933 Gross per 24 hour  Intake 200 ml  Output --  Net 200 ml   Filed Weights   08/04/23 1318  Weight: 66.5 kg    Examination:  Physical Exam Vitals and nursing note reviewed.  Constitutional:      General: He is not in acute distress.    Appearance: He is not toxic-appearing or diaphoretic.     Comments: Chronically ill  HENT:     Head: Normocephalic and atraumatic.  Eyes:     General: No scleral icterus. Cardiovascular:     Rate and Rhythm: Tachycardia present. Rhythm irregular.     Pulses: Normal pulses.  Pulmonary:     Effort: Pulmonary effort is normal. No respiratory distress.     Breath sounds: No wheezing.  Abdominal:     General: Abdomen is flat. Bowel sounds are normal.  Musculoskeletal:     Right lower leg: No edema.     Left lower leg: No edema.  Skin:    General: Skin is warm and dry.     Capillary  Refill: Capillary refill takes less than 2 seconds.     Data Reviewed: I have personally reviewed following labs and imaging studies  CBC: Recent Labs  Lab 08/02/23 2254 08/03/23 0423 08/04/23 0337 08/05/23 0402  WBC 15.7* 11.9* 10.4 12.6*  NEUTROABS 14.2*  --  7.8* 9.8*  HGB 16.5 14.5 16.2 16.2  HCT 47.9 43.6 46.9 47.2  MCV 90.7 91.6 91.6 90.4  PLT 214 204 213 235   Basic Metabolic Panel: Recent Labs  Lab 08/02/23 2254 08/03/23 0423 08/04/23 0337 08/05/23 0402  NA 134* 138 138 135  K 4.4 4.0 3.2* 4.0  CL 100 103 102 102  CO2 21* 22 23 21*  GLUCOSE 333* 294* 231* 178*  BUN 14 13 9  9  CREATININE 0.97 0.95 0.72 0.77  CALCIUM  9.7 9.0 8.9 8.8*  MG  --   --  1.5* 2.0   GFR: Estimated Creatinine Clearance: 72.7 mL/min (by C-G formula based on SCr of 0.77 mg/dL). Liver Function Tests: Recent Labs  Lab 08/02/23 2254  AST 17  ALT 15  ALKPHOS 41  BILITOT 1.2  PROT 7.3  ALBUMIN 3.7   HbA1C: Recent Labs    08/03/23 0423  HGBA1C 9.4*   CBG: Recent Labs  Lab 08/04/23 0846 08/04/23 1153 08/04/23 1644 08/04/23 2041 08/05/23 0620  GLUCAP 246* 270* 232* 135* 199*   Sepsis Labs: Recent Labs  Lab 08/02/23 2254 08/03/23 0040 08/03/23 0820 08/04/23 0337  PROCALCITON  --   --   --  0.23  LATICACIDVEN 2.1* 3.0* 1.5  --     Recent Results (from the past 240 hours)  Resp panel by RT-PCR (RSV, Flu A&B, Covid) Anterior Nasal Swab     Status: None   Collection Time: 08/02/23 10:40 PM   Specimen: Anterior Nasal Swab  Result Value Ref Range Status   SARS Coronavirus 2 by RT PCR NEGATIVE NEGATIVE Final   Influenza A by PCR NEGATIVE NEGATIVE Final   Influenza B by PCR NEGATIVE NEGATIVE Final    Comment: (NOTE) The Xpert Xpress SARS-CoV-2/FLU/RSV plus assay is intended as an aid in the diagnosis of influenza from Nasopharyngeal swab specimens and should not be used as a sole basis for treatment. Nasal washings and aspirates are unacceptable for Xpert Xpress  SARS-CoV-2/FLU/RSV testing.  Fact Sheet for Patients: BloggerCourse.com  Fact Sheet for Healthcare Providers: SeriousBroker.it  This test is not yet approved or cleared by the [place]  FDA and has been authorized for detection and/or diagnosis of SARS-CoV-2 by FDA under an Emergency Use Authorization (EUA). This EUA will remain in effect (meaning this test can be used) for the duration of the COVID-19 declaration under Section 564(b)(1) of the Act, 21 U.S.C. section 360bbb-3(b)(1), unless the authorization is terminated or revoked.     Resp Syncytial Virus by PCR NEGATIVE NEGATIVE Final    Comment: (NOTE) Fact Sheet for Patients: BloggerCourse.com  Fact Sheet for Healthcare Providers: SeriousBroker.it  This test is not yet approved or cleared by the [place]  FDA and has been authorized for detection and/or diagnosis of SARS-CoV-2 by FDA under an Emergency Use Authorization (EUA). This EUA will remain in effect (meaning this test can be used) for the duration of the COVID-19 declaration under Section 564(b)(1) of the Act, 21 U.S.C. section 360bbb-3(b)(1), unless the authorization is terminated or revoked.  Performed at [facility], [address] [place], [place] [address]   Blood culture (routine x 2)     Status: None (Preliminary result)   Collection Time: 08/02/23 11:36 PM   Specimen: BLOOD  Result Value Ref Range Status   Specimen Description BLOOD SITE NOT SPECIFIED  Final   Special Requests   Final    BOTTLES DRAWN AEROBIC AND ANAEROBIC Blood Culture adequate volume   Culture   Final    NO GROWTH 2 DAYS Performed at [facility], [address], [place], [place] [address]    Report Status PENDING  Incomplete  Blood culture (routine x 2)     Status: None (Preliminary result)   Collection Time: 08/02/23 11:41 PM   Specimen: BLOOD  Result  Value Ref Range Status   Specimen Description BLOOD BLOOD RIGHT FOREARM  Final   Special Requests   Final  BOTTLES DRAWN AEROBIC AND ANAEROBIC Blood Culture adequate volume   Culture   Final    NO GROWTH 2 DAYS Performed at [facility], [address], [place], [place] [address]    Report Status PENDING  Incomplete     Radiology Studies: ECHOCARDIOGRAM COMPLETE Result Date: 08/04/2023    ECHOCARDIOGRAM REPORT   Patient Name:   Bruce Franklin Tailor Date of Exam: 08/04/2023 Medical Rec #:  [id number]      Height:       75.0 in Accession #:    [id number]     Weight:       160.0 lb Date of Birth:  [birth year]     BSA:          1.996 m Patient Age:    77 years       BP:           113/69 mmHg Patient Gender: M              HR:           115 bpm. Exam Location:  Inpatient Procedure: 2D Echo, Cardiac Doppler, Color Doppler and Intracardiac            Opacification Agent (Both Spectral and Color Flow Doppler were            utilized during procedure). Indications:    Atrial fibrillation  History:        Patient has no prior history of Echocardiogram examinations.                 CAD, COPD; Risk Factors:Diabetes and Hypertension.  Sonographer:    Aldon Ambrosia Referring Phys: [id number] Lonn Im IMPRESSIONS  1. Left ventricular ejection fraction, by estimation, is 50 to 55%. The left ventricle has low normal function. Left ventricular endocardial border not optimally defined to evaluate regional wall motion but suspect apical hypokinesis. Left ventricular diastolic parameters are indeterminate.  2. Right ventricular systolic function is normal. The right ventricular size is normal. Tricuspid regurgitation signal is inadequate for assessing PA pressure.  3. The mitral valve is normal in structure. No evidence of mitral valve regurgitation. No evidence of mitral stenosis.  4. The aortic valve is tricuspid. There is mild calcification of the aortic valve. Aortic valve regurgitation is not visualized. No aortic stenosis  is present.  5. The inferior vena cava is normal in size with <50% respiratory variability, suggesting right atrial pressure of 8 mmHg.  6. Technically difficult study with poor acoustic windows. FINDINGS  Left Ventricle: Left ventricular ejection fraction, by estimation, is 50 to 55%. The left ventricle has low normal function. Left ventricular endocardial border not optimally defined to evaluate regional wall motion. The left ventricular internal cavity  size was normal in size. There is no left ventricular hypertrophy. Left ventricular diastolic parameters are indeterminate. Right Ventricle: The right ventricular size is normal. No increase in right ventricular wall thickness. Right ventricular systolic function is normal. Tricuspid regurgitation signal is inadequate for assessing PA pressure. Left Atrium: Left atrial size was normal in size. Right Atrium: Right atrial size was normal in size. Pericardium: There is no evidence of pericardial effusion. Mitral Valve: The mitral valve is normal in structure. No evidence of mitral valve regurgitation. No evidence of mitral valve stenosis. MV peak gradient, 1.1 mmHg. The mean mitral valve gradient is 1.0 mmHg. Tricuspid Valve: The tricuspid valve is normal in structure. Tricuspid valve regurgitation is not demonstrated. Aortic Valve: The aortic valve is tricuspid.  There is mild calcification of the aortic valve. Aortic valve regurgitation is not visualized. No aortic stenosis is present. Aortic valve mean gradient measures 1.0 mmHg. Aortic valve peak gradient measures 2.3 mmHg. Aortic valve area, by VTI measures 3.39 cm. Pulmonic Valve: The pulmonic valve was normal in structure. Pulmonic valve regurgitation is not visualized. Aorta: The aortic root is normal in size and structure. Venous: The inferior vena cava is normal in size with less than 50% respiratory variability, suggesting right atrial pressure of 8 mmHg. IAS/Shunts: The interatrial septum was not well  visualized.  LEFT VENTRICLE PLAX 2D LVIDd:         5.40 cm     Diastology LVIDs:         3.50 cm     LV e' medial:    6.85 cm/s LV PW:         0.70 cm     LV E/e' medial:  7.3 LV IVS:        0.80 cm     LV e' lateral:   11.90 cm/s LVOT diam:     2.10 cm     LV E/e' lateral: 4.2 LV SV:         33 LV SV Index:   17 LVOT Area:     3.46 cm  LV Volumes (MOD) LV vol d, MOD A2C: 83.1 ml LV vol d, MOD A4C: 63.1 ml LV vol s, MOD A2C: 23.8 ml LV vol s, MOD A4C: 26.9 ml LV SV MOD A2C:     59.3 ml LV SV MOD A4C:     63.1 ml LV SV MOD BP:      50.3 ml RIGHT VENTRICLE             IVC RV Basal diam:  3.70 cm     IVC diam: 1.70 cm RV Mid diam:    2.40 cm RV S prime:     13.10 cm/s LEFT ATRIUM           Index        RIGHT ATRIUM           Index LA diam:      3.20 cm 1.60 cm/m   RA Area:     10.50 cm LA Vol (A4C): 30.4 ml 15.23 ml/m  RA Volume:   23.40 ml  11.72 ml/m  AORTIC VALVE                    PULMONIC VALVE AV Area (Vmax):    3.19 cm     PV Vmax:       0.99 m/s AV Area (Vmean):   3.30 cm     PV Peak grad:  3.9 mmHg AV Area (VTI):     3.39 cm AV Vmax:           76.45 cm/s AV Vmean:          54.550 cm/s AV VTI:            0.098 m AV Peak Grad:      2.3 mmHg AV Mean Grad:      1.0 mmHg LVOT Vmax:         70.40 cm/s LVOT Vmean:        52.000 cm/s LVOT VTI:          0.096 m LVOT/AV VTI ratio: 0.98  AORTA Ao Root diam: 3.50 cm Ao Asc diam:  2.70 cm MITRAL VALVE MV Area (PHT): 4.65 cm  SHUNTS MV Area VTI:   4.29 cm    Systemic VTI:  0.10 m MV Peak grad:  1.1 mmHg    Systemic Diam: 2.10 cm MV Mean grad:  1.0 mmHg MV Vmax:       0.52 m/s MV Vmean:      33.0 cm/s MV Decel Time: 163 msec MV E velocity: 50.30 cm/s Dalton McleanMD Electronically signed by Archer Bear Signature Date/Time: 08/04/2023/8:27:31 AM    Final     Scheduled Meds:  apixaban   5 mg Oral BID   azithromycin   500 mg Oral Daily   cefadroxil   500 mg Oral BID   insulin  aspart  0-5 Units Subcutaneous QHS   insulin  aspart  0-9 Units Subcutaneous TID WC    insulin  glargine-yfgn  5 Units Subcutaneous Daily   metoprolol  succinate  50 mg Oral Daily   sodium chloride  flush  3 mL Intravenous Q12H   Continuous Infusions:   LOS: 2 days   Time spent: 45 minutes  Unk Garb, DO  [facility]  08/05/2023, 10:07 AM

## 2023-08-05 NOTE — Plan of Care (Signed)
  Problem: Fluid Volume: Goal: Ability to maintain a balanced intake and output will improve Outcome: Not Progressing   Problem: Nutritional: Goal: Maintenance of adequate nutrition will improve Outcome: Not Progressing

## 2023-08-05 NOTE — Progress Notes (Addendum)
   Pt failed MBS. Aspirated with all consistencies.  ST recommends NPO. Will attempt to get cortrak placed. Change eliquis  to SQ lovenox . Change abx back to IV.  Called and updated wife via phone.  Unk Garb, DO [facility]

## 2023-08-05 NOTE — Evaluation (Signed)
 Clinical/Bedside Swallow Evaluation Patient Details  Name: Bruce Franklin MRN: [id number] Date of Birth: 1945-07-18  Today's Date: 08/05/2023 Time: SLP Start Time (ACUTE ONLY): 1030 SLP Stop Time (ACUTE ONLY): 1050 SLP Time Calculation (min) (ACUTE ONLY): 20 min  Past Medical History:  Past Medical History:  Diagnosis Date   Acute biliary pancreatitis    Anxiety    Cholelithiasis    COPD (chronic obstructive pulmonary disease) (HCC)    Diabetes mellitus without complication (HCC)    Pneumonia    Prostate cancer (HCC)    Smokes 1 pack of cigarettes per day    Past Surgical History:  Past Surgical History:  Procedure Laterality Date   CHOLECYSTECTOMY N/A 04/01/2016   Procedure: LAPAROSCOPIC CHOLECYSTECTOMY;  Surgeon: Adalberto Acton, MD;  Location: [facility] OR;  Service: General;  Laterality: N/A;   ERCP N/A 03/30/2016   Procedure: ENDOSCOPIC RETROGRADE CHOLANGIOPANCREATOGRAPHY (ERCP);  Surgeon: Janel Medford, MD;  Location: [facility] ENDOSCOPY;  Service: Endoscopy;  Laterality: N/A;   LUMBAR FUSION     PROSTATE SURGERY     ROTATOR CUFF REPAIR     HPI:  Bruce Franklin is a 78 y.o. male with medical history significant for hypertension, hyperlipidemia, CAD, chronic HFmrEF, COPD, and diabetes mellitus who presents with productive cough, fatigue, and confusion. Dx with sepsis due to PNA. REcent admission 08/02/23 for PNA. Note from 4/25 am reports "Patient saturations 87-88% on 1LNC. RN increased patient's oxygen to [facility]. RN changed patient's gown and patient mentioned to RN "I have something in my throat". RN set up suction and suctioned oral cavity. A piece of sausage and egg was removed. RN made patient and MD aware of findings. Patient is NPO pending speech eval."    Assessment / Plan / Recommendation  Clinical Impression  Patient presents with evidence of an oropharyngeal dysphagiag marked by decreased oral containment of bolus, anterior labial spillage, intermittent/inconsistent oral  holding, signs of delayed swallow initiation, and evidence of aspiration wtih thin liquids characterized by cough post swallow, Intermittent wet vocal quality also noted across consistencies with complaints of globus. Per nursing notes, large piece of sausage and eggs suctioned from the back of the throat this am after patient was desaturating and complaining of globus.  Specimen kept and upon observation it does not look like it was masticated fully. Also question an esophageal component. Plan for instrumental study via MBS this afternoon to evaluate swallowing physiology and determine least restrictive diet. SLP Visit Diagnosis: Dysphagia, unspecified (R13.10)       Diet Recommendation NPO except meds    Medication Administration: Crushed with puree    Other  Recommendations Oral Care Recommendations: Oral care QID    Recommendations for follow up therapy are one component of a multi-disciplinary discharge planning process, led by the attending physician.  Recommendations may be updated based on patient status, additional functional criteria and insurance authorization.                  Prognosis Prognosis for improved oropharyngeal function: Good      Swallow Study   General HPI: Bruce Franklin is a 78 y.o. male with medical history significant for hypertension, hyperlipidemia, CAD, chronic HFmrEF, COPD, and diabetes mellitus who presents with productive cough, fatigue, and confusion. Dx with sepsis due to PNA. REcent admission 08/02/23 for PNA. Note from 4/25 am reports "Patient saturations 87-88% on 1LNC. RN increased patient's oxygen to [facility]. RN changed patient's gown and patient mentioned to RN "I have something in  my throat". RN set up suction and suctioned oral cavity. A piece of sausage and egg was removed. RN made patient and MD aware of findings. Patient is NPO pending speech eval." Type of Study: Bedside Swallow Evaluation Previous Swallow Assessment: none Diet Prior to this  Study: NPO Temperature Spikes Noted: No Respiratory Status: Room air History of Recent Intubation: No Behavior/Cognition: Alert;Confused;Requires cueing (appears HOH) Oral Cavity Assessment: Dry Oral Care Completed by SLP: Recent completion by staff Oral Cavity - Dentition: Edentulous Vision: Functional for self-feeding Self-Feeding Abilities: Able to feed self Patient Positioning: Upright in bed Baseline Vocal Quality: Low vocal intensity Volitional Cough: Weak;Congested Volitional Swallow: Able to elicit    Oral/Motor/Sensory Function Overall Oral Motor/Sensory Function: Generalized oral weakness   Ice Chips Ice chips: Not tested   Thin Liquid Thin Liquid: Impaired Presentation: Cup;Self Fed Oral Phase Impairments: Reduced labial seal Oral Phase Functional Implications: Left anterior spillage;Oral holding Pharyngeal  Phase Impairments: Suspected delayed Swallow;Multiple swallows;Wet Vocal Quality;Cough - Immediate;Cough - Delayed    Nectar Thick Nectar Thick Liquid: Not tested   Honey Thick Honey Thick Liquid: Not tested   Puree Puree: Impaired Presentation: Spoon Oral Phase Impairments: Impaired mastication Oral Phase Functional Implications: Oral holding Pharyngeal Phase Impairments: Wet Vocal Quality (subtle)   Solid     Solid: Not tested     Delsa Fife MA, CCC-SLP  Adric Wrede Meryl 08/05/2023,11:25 AM

## 2023-08-05 NOTE — Discharge Instructions (Signed)

## 2023-08-05 NOTE — TOC Transition Note (Addendum)
 Transition of Care [facility]) - Discharge Note   Patient Details  Name: Bruce Franklin MRN: [id number] Date of Birth: 18-Aug-1945  Transition of Care [facility] CM/SW Contact:  Jennett Model, RN Phone Number: 08/05/2023, 12:50 PM   Clinical Narrative:    For possible dc today, wife will transport him home .  NCM asked wife if needed w/chair, hosp bed and 3 n 1, she states no they do not need any DME.     Barriers to Discharge: Continued Medical Work up   Patient Goals and CMS Choice Patient states their goals for this hospitalization and ongoing recovery are:: To go home          Discharge Placement                       Discharge Plan and Services Additional resources added to the After Visit Summary for   In-house Referral: Clinical Social Work                                   Social Drivers of Health (SDOH) Interventions SDOH Screenings   Food Insecurity: No Food Insecurity (08/04/2023)  Housing: High Risk (08/04/2023)  Transportation Needs: Unmet Transportation Needs (08/04/2023)  Utilities: At Risk (08/04/2023)  Social Connections: Socially Isolated (08/04/2023)  Tobacco Use: High Risk (08/03/2023)     Readmission Risk Interventions     No data to display

## 2023-08-05 NOTE — TOC Progression Note (Signed)
 Transition of Care [facility]) - Progression Note    Patient Details  Name: Bruce Franklin MRN: [id number] Date of Birth: 24-Aug-1945  Transition of Care [facility]) CM/SW Contact  Jennett Model, RN Phone Number: 08/05/2023, 12:41 PM  Clinical Narrative:    NCM notified by CSW that patient will be going home.  NCM went to room to speak with patient, wife is at the bedside and states she is the caregiver and that they will not not any HH services, he has a walker, cane and shower chair at home.  She states if he needs anything she will reach out to his PCP.  She states he gets around pretty good at home with his walker.  NCM offered choice for [facility] services, but she states they do not need.   Expected Discharge Plan: Home/Self Care Barriers to Discharge: Continued Medical Work up  Expected Discharge Plan and Services In-house Referral: Clinical Social Work     Living arrangements for the past 2 months: Single Family Home                                       Social Determinants of Health (SDOH) Interventions SDOH Screenings   Food Insecurity: No Food Insecurity (08/04/2023)  Housing: High Risk (08/04/2023)  Transportation Needs: Unmet Transportation Needs (08/04/2023)  Utilities: At Risk (08/04/2023)  Social Connections: Socially Isolated (08/04/2023)  Tobacco Use: High Risk (08/03/2023)    Readmission Risk Interventions     No data to display

## 2023-08-05 NOTE — Procedures (Signed)
 Cortrak  Person Inserting Tube:  Kendell Bane C, RD Tube Type:  Cortrak - 43 inches Tube Size:  10 Tube Location:  Left nare Initial Placement:  Stomach Secured by: Bridle Technique Used to Measure Tube Placement:  Marking at nare/corner of mouth Cortrak Secured At:  68 cm   Cortrak Tube Team Note:  Consult received to place a Cortrak feeding tube.   No x-ray is required. RN may begin using tube.   If the tube becomes dislodged please keep the tube and contact the Cortrak team at [email or web address] for replacement.  If after hours and replacement cannot be delayed, place a NG tube and confirm placement with an abdominal x-ray.    Cammy Copa., RD, LDN, CNSC See AMiON for contact information

## 2023-08-05 NOTE — Progress Notes (Signed)
 PHARMACY - ANTICOAGULATION CONSULT NOTE  Pharmacy Consult for Lovenox  Indication: atrial fibrillation  Allergies  Allergen Reactions   Ibuprofen Hives and Rash   Sulfa Antibiotics Hives    "makes my tongue break out"    Patient Measurements: Height: 6\' 3"  (190.5 cm) Weight: 66.5 kg (146 lb 9.7 oz) IBW/kg (Calculated) : 84.5 HEPARIN  DW (KG): 66.5  Vital Signs: Temp: 97.9 F (36.6 C) (04/25 1053) Temp Source: Oral (04/25 1053) BP: 143/94 (04/25 1053) Pulse Rate: 99 (04/25 1053)  Labs: Recent Labs    08/03/23 0423 08/03/23 2023 08/04/23 0337 08/04/23 1230 08/05/23 0402  HGB 14.5  --  16.2  --  16.2  HCT 43.6  --  46.9  --  47.2  PLT 204  --  213  --  235  APTT  --  26  --   --   --   HEPARINUNFRC  --   --  0.12* 0.31  --   CREATININE 0.95  --  0.72  --  0.77    Estimated Creatinine Clearance: 72.7 mL/min (by C-G formula based on SCr of 0.77 mg/dL).   Medical History: Past Medical History:  Diagnosis Date   Acute biliary pancreatitis    Anxiety    Cholelithiasis    COPD (chronic obstructive pulmonary disease) (HCC)    Diabetes mellitus without complication (HCC)    Pneumonia    Prostate cancer (HCC)    Smokes 1 pack of cigarettes per day     Medications:  Medications Prior to Admission  Medication Sig Dispense Refill Last Dose/Taking   albuterol  (VENTOLIN  HFA) 108 (90 Base) MCG/ACT inhaler Inhale 2 puffs into the lungs every 6 (six) hours as needed for shortness of breath or wheezing.   Past Week   ALPRAZolam  (XANAX ) 0.5 MG tablet Take 0.5 mg by mouth 3 (three) times daily as needed for anxiety.   Past Week   aspirin EC 81 MG tablet Take 81 mg by mouth daily.   Past Week   gabapentin (NEURONTIN) 300 MG capsule Take 300 mg by mouth 2 (two) times daily as needed (pain).   Past Month   metFORMIN  (GLUCOPHAGE ) 1000 MG tablet Take 1,000 mg by mouth 2 (two) times daily.   08/02/2023   oxyCODONE -acetaminophen  (PERCOCET) 10-325 MG tablet Take 1 tablet by mouth 4  (four) times daily as needed for pain.   08/02/2023   OZEMPIC, 1 MG/DOSE, 4 MG/3ML SOPN Inject 1 mg into the skin once a week.   08/02/2023    Assessment: 78 y.o. on Eliquis  for afib - last dose 4/25 ~0800. Pt has since failed MBS so speech recommending NPO. Changing Eliquis  to Lovenox  for now. CBC stable.  Goal of Therapy:  Anti-Xa level 0.6-1 units/ml 4hrs after LMWH dose given Monitor platelets by anticoagulation protocol: Yes   Plan:  Lovenox  65 mg SQ q12h. Next dose at 2000. CBC every 72h while on Lovenox   Enrigue Harvard, PharmD, BCPS Please see amion for complete clinical pharmacist phone list 08/05/2023,3:13 PM

## 2023-08-05 NOTE — Progress Notes (Signed)
 Progress Note  Patient Name: Bruce Franklin Date of Encounter: 08/05/2023  Primary Cardiologist: None   Subjective   Patient seen and examined at his bedside. He was sitting up in bed when I arrived.   Inpatient Medications    Scheduled Meds:  apixaban   5 mg Oral BID   azithromycin   500 mg Oral Daily   insulin  aspart  0-5 Units Subcutaneous QHS   insulin  aspart  0-9 Units Subcutaneous TID WC   insulin  glargine-yfgn  5 Units Subcutaneous Daily   metoprolol  succinate  50 mg Oral Daily   sodium chloride  flush  3 mL Intravenous Q12H   Continuous Infusions:  cefTRIAXone  (ROCEPHIN )  IV 2 g (08/05/23 0842)   PRN Meds: acetaminophen  **OR** acetaminophen , guaiFENesin , ipratropium-albuterol , ondansetron  **OR** ondansetron  (ZOFRAN ) IV, senna-docusate   Vital Signs    Vitals:   08/04/23 1915 08/04/23 1916 08/04/23 1917 08/05/23 0749  BP: 120/76   (!) 134/55  Pulse:    (!) 106  Resp: 19 (!) 25 (!) 25 20  Temp: 98.1 F (36.7 C)   98.9 F (37.2 C)  TempSrc: Oral   Oral  SpO2:    91%  Weight:      Height:       No intake or output data in the 24 hours ending 08/05/23 0929 Filed Weights   08/04/23 1318  Weight: 66.5 kg    Telemetry     Sinus rhythm- Personally Reviewed  ECG     - Personally Reviewed  Physical Exam    General: Comfortable Head: Atraumatic, normal size  Eyes: PEERLA, EOMI  Neck: Supple, normal JVD Cardiac: Normal S1, S2; RRR; no murmurs, rubs, or gallops Lungs: wheezing bilaterally Abd: Soft, nontender, no hepatomegaly  Ext: warm, no edema Musculoskeletal: No deformities, BUE and BLE strength normal and equal Skin: Warm and dry, no rashes   Neuro: Alert and oriented to person, place, time, and situation, CNII-XII grossly intact, no focal deficits  Psych: Normal mood and affect   Labs    Chemistry Recent Labs  Lab 08/02/23 2254 08/03/23 0423 08/04/23 0337 08/05/23 0402  NA 134* 138 138 135  K 4.4 4.0 3.2* 4.0  CL 100 103 102 102  CO2  21* 22 23 21*  GLUCOSE 333* 294* 231* 178*  BUN 14 13 9 9   CREATININE 0.97 0.95 0.72 0.77  CALCIUM  9.7 9.0 8.9 8.8*  PROT 7.3  --   --   --   ALBUMIN 3.7  --   --   --   AST 17  --   --   --   ALT 15  --   --   --   ALKPHOS 41  --   --   --   BILITOT 1.2  --   --   --   GFRNONAA >60 >60 >60 >60  ANIONGAP 13 13 13 12      Hematology Recent Labs  Lab 08/03/23 0423 08/04/23 0337 08/05/23 0402  WBC 11.9* 10.4 12.6*  RBC 4.76 5.12 5.22  HGB 14.5 16.2 16.2  HCT 43.6 46.9 47.2  MCV 91.6 91.6 90.4  MCH 30.5 31.6 31.0  MCHC 33.3 34.5 34.3  RDW 13.2 13.1 13.2  PLT 204 213 235    Cardiac EnzymesNo results for input(s): "TROPONINI" in the last 168 hours. No results for input(s): "TROPIPOC" in the last 168 hours.   BNPNo results for input(s): "BNP", "PROBNP" in the last 168 hours.   DDimer No results for input(s): "DDIMER" in  the last 168 hours.   Radiology    ECHOCARDIOGRAM COMPLETE Result Date: 08/04/2023    ECHOCARDIOGRAM REPORT   Patient Name:   Bruce Franklin Date of Exam: 08/04/2023 Medical Rec #:  [id number]      Height:       75.0 in Accession #:    [id number]     Weight:       160.0 lb Date of Birth:  1945/04/28     BSA:          1.996 m Patient Age:    78 years       BP:           113/69 mmHg Patient Gender: M              HR:           115 bpm. Exam Location:  Inpatient Procedure: 2D Echo, Cardiac Doppler, Color Doppler and Intracardiac            Opacification Agent (Both Spectral and Color Flow Doppler were            utilized during procedure). Indications:    Atrial fibrillation  History:        Patient has no prior history of Echocardiogram examinations.                 CAD, COPD; Risk Factors:Diabetes and Hypertension.  Sonographer:    Aldon Ambrosia Referring Phys: [id number] ERIC CHEN IMPRESSIONS  1. Left ventricular ejection fraction, by estimation, is 50 to 55%. The left ventricle has low normal function. Left ventricular endocardial border not optimally defined to evaluate  regional wall motion but suspect apical hypokinesis. Left ventricular diastolic parameters are indeterminate.  2. Right ventricular systolic function is normal. The right ventricular size is normal. Tricuspid regurgitation signal is inadequate for assessing PA pressure.  3. The mitral valve is normal in structure. No evidence of mitral valve regurgitation. No evidence of mitral stenosis.  4. The aortic valve is tricuspid. There is mild calcification of the aortic valve. Aortic valve regurgitation is not visualized. No aortic stenosis is present.  5. The inferior vena cava is normal in size with <50% respiratory variability, suggesting right atrial pressure of 8 mmHg.  6. Technically difficult study with poor acoustic windows. FINDINGS  Left Ventricle: Left ventricular ejection fraction, by estimation, is 50 to 55%. The left ventricle has low normal function. Left ventricular endocardial border not optimally defined to evaluate regional wall motion. The left ventricular internal cavity  size was normal in size. There is no left ventricular hypertrophy. Left ventricular diastolic parameters are indeterminate. Right Ventricle: The right ventricular size is normal. No increase in right ventricular wall thickness. Right ventricular systolic function is normal. Tricuspid regurgitation signal is inadequate for assessing PA pressure. Left Atrium: Left atrial size was normal in size. Right Atrium: Right atrial size was normal in size. Pericardium: There is no evidence of pericardial effusion. Mitral Valve: The mitral valve is normal in structure. No evidence of mitral valve regurgitation. No evidence of mitral valve stenosis. MV peak gradient, 1.1 mmHg. The mean mitral valve gradient is 1.0 mmHg. Tricuspid Valve: The tricuspid valve is normal in structure. Tricuspid valve regurgitation is not demonstrated. Aortic Valve: The aortic valve is tricuspid. There is mild calcification of the aortic valve. Aortic valve regurgitation  is not visualized. No aortic stenosis is present. Aortic valve mean gradient measures 1.0 mmHg. Aortic valve peak gradient measures 2.3 mmHg. Aortic valve area, by VTI  measures 3.39 cm. Pulmonic Valve: The pulmonic valve was normal in structure. Pulmonic valve regurgitation is not visualized. Aorta: The aortic root is normal in size and structure. Venous: The inferior vena cava is normal in size with less than 50% respiratory variability, suggesting right atrial pressure of 8 mmHg. IAS/Shunts: The interatrial septum was not well visualized.  LEFT VENTRICLE PLAX 2D LVIDd:         5.40 cm     Diastology LVIDs:         3.50 cm     LV e' medial:    6.85 cm/s LV PW:         0.70 cm     LV E/e' medial:  7.3 LV IVS:        0.80 cm     LV e' lateral:   11.90 cm/s LVOT diam:     2.10 cm     LV E/e' lateral: 4.2 LV SV:         33 LV SV Index:   17 LVOT Area:     3.46 cm  LV Volumes (MOD) LV vol d, MOD A2C: 83.1 ml LV vol d, MOD A4C: 63.1 ml LV vol s, MOD A2C: 23.8 ml LV vol s, MOD A4C: 26.9 ml LV SV MOD A2C:     59.3 ml LV SV MOD A4C:     63.1 ml LV SV MOD BP:      50.3 ml RIGHT VENTRICLE             IVC RV Basal diam:  3.70 cm     IVC diam: 1.70 cm RV Mid diam:    2.40 cm RV S prime:     13.10 cm/s LEFT ATRIUM           Index        RIGHT ATRIUM           Index LA diam:      3.20 cm 1.60 cm/m   RA Area:     10.50 cm LA Vol (A4C): 30.4 ml 15.23 ml/m  RA Volume:   23.40 ml  11.72 ml/m  AORTIC VALVE                    PULMONIC VALVE AV Area (Vmax):    3.19 cm     PV Vmax:       0.99 m/s AV Area (Vmean):   3.30 cm     PV Peak grad:  3.9 mmHg AV Area (VTI):     3.39 cm AV Vmax:           76.45 cm/s AV Vmean:          54.550 cm/s AV VTI:            0.098 m AV Peak Grad:      2.3 mmHg AV Mean Grad:      1.0 mmHg LVOT Vmax:         70.40 cm/s LVOT Vmean:        52.000 cm/s LVOT VTI:          0.096 m LVOT/AV VTI ratio: 0.98  AORTA Ao Root diam: 3.50 cm Ao Asc diam:  2.70 cm MITRAL VALVE MV Area (PHT): 4.65 cm    SHUNTS MV  Area VTI:   4.29 cm    Systemic VTI:  0.10 m MV Peak grad:  1.1 mmHg    Systemic Diam: 2.10 cm MV Mean grad:  1.0 mmHg MV  Vmax:       0.52 m/s MV Vmean:      33.0 cm/s MV Decel Time: 163 msec MV E velocity: 50.30 cm/s Dalton McleanMD Electronically signed by Archer Bear Signature Date/Time: 08/04/2023/8:27:31 AM    Final     Cardiac Studies     Patient Profile     78 y.o. male  with a hx of CAD s/p STEMI 03/2018, chronic HFmrEF, ischemic cardiomyopathy, history of prior apical LV thrombus, diabetes, COPD, HTN, HLD her for pneumonia and now in atrial fibrillation.   Assessment & Plan    Paroxysmal atrial fibrillation with RVR  Chronic Heart failure midrange EF  Hyperlipidemia  CAD  COPD  Diabetes mellitus Sepsis 2/2 PNA    He has converted to sinus rhythm but currently with sinus tachycardia which is not unexpected given his underlying pneumonia.  Will continue the patient on his current dose of metoprolol  succinate and Eliquis .  Yesterday echo showed improved ejection fraction of 50 to 55%.  I do worry though based on his history if he will follow-up for cardiovascular care.  In the meantime in terms of his infection is being managed by the hospitalist team.  Will defer to the hospitalist team but he does look like he could benefit from more IV antibiotics and swallow eval/suspected aspiration.   For questions or updates, please contact [facility] Please consult [email or web address] for contact info under Cardiology/STEMI.      Signed, Jaeliana Lococo, DO  08/05/2023, 9:29 AM

## 2023-08-06 DIAGNOSIS — I480 Paroxysmal atrial fibrillation: Secondary | ICD-10-CM | POA: Diagnosis not present

## 2023-08-06 DIAGNOSIS — I4891 Unspecified atrial fibrillation: Secondary | ICD-10-CM | POA: Diagnosis not present

## 2023-08-06 DIAGNOSIS — J189 Pneumonia, unspecified organism: Secondary | ICD-10-CM | POA: Diagnosis not present

## 2023-08-06 DIAGNOSIS — R1312 Dysphagia, oropharyngeal phase: Secondary | ICD-10-CM

## 2023-08-06 DIAGNOSIS — I5022 Chronic systolic (congestive) heart failure: Secondary | ICD-10-CM | POA: Diagnosis not present

## 2023-08-06 LAB — GLUCOSE, CAPILLARY
Glucose-Capillary: 160 mg/dL — ABNORMAL HIGH (ref 70–99)
Glucose-Capillary: 165 mg/dL — ABNORMAL HIGH (ref 70–99)
Glucose-Capillary: 173 mg/dL — ABNORMAL HIGH (ref 70–99)
Glucose-Capillary: 210 mg/dL — ABNORMAL HIGH (ref 70–99)
Glucose-Capillary: 221 mg/dL — ABNORMAL HIGH (ref 70–99)
Glucose-Capillary: 257 mg/dL — ABNORMAL HIGH (ref 70–99)

## 2023-08-06 LAB — PHOSPHORUS: Phosphorus: 3.6 mg/dL (ref 2.5–4.6)

## 2023-08-06 LAB — CBC WITH DIFFERENTIAL/PLATELET
Abs Immature Granulocytes: 0.02 10*3/uL (ref 0.00–0.07)
Basophils Absolute: 0.1 10*3/uL (ref 0.0–0.1)
Basophils Relative: 1 %
Eosinophils Absolute: 0.1 10*3/uL (ref 0.0–0.5)
Eosinophils Relative: 1 %
HCT: 48.3 % (ref 39.0–52.0)
Hemoglobin: 16.3 g/dL (ref 13.0–17.0)
Immature Granulocytes: 0 %
Lymphocytes Relative: 12 %
Lymphs Abs: 1.1 10*3/uL (ref 0.7–4.0)
MCH: 30.3 pg (ref 26.0–34.0)
MCHC: 33.7 g/dL (ref 30.0–36.0)
MCV: 89.8 fL (ref 80.0–100.0)
Monocytes Absolute: 0.7 10*3/uL (ref 0.1–1.0)
Monocytes Relative: 8 %
Neutro Abs: 7.3 10*3/uL (ref 1.7–7.7)
Neutrophils Relative %: 78 %
Platelets: 257 10*3/uL (ref 150–400)
RBC: 5.38 MIL/uL (ref 4.22–5.81)
RDW: 13.1 % (ref 11.5–15.5)
WBC: 9.3 10*3/uL (ref 4.0–10.5)
nRBC: 0 % (ref 0.0–0.2)

## 2023-08-06 LAB — BASIC METABOLIC PANEL WITH GFR
Anion gap: 12 (ref 5–15)
BUN: 13 mg/dL (ref 8–23)
CO2: 20 mmol/L — ABNORMAL LOW (ref 22–32)
Calcium: 9.5 mg/dL (ref 8.9–10.3)
Chloride: 105 mmol/L (ref 98–111)
Creatinine, Ser: 0.75 mg/dL (ref 0.61–1.24)
GFR, Estimated: >60 mL/min (ref >60)
Glucose, Bld: 207 mg/dL — ABNORMAL HIGH (ref 70–99)
Potassium: 3.7 mmol/L (ref 3.5–5.1)
Sodium: 137 mmol/L (ref 135–145)

## 2023-08-06 LAB — MAGNESIUM: Magnesium: 1.9 mg/dL (ref 1.7–2.4)

## 2023-08-06 MED ORDER — INSULIN GLARGINE-YFGN 100 UNIT/ML ~~LOC~~ SOLN
10.0000 [IU] | Freq: Every day | SUBCUTANEOUS | Status: DC
  Administered 2023-08-07 – 2023-08-12 (×6): 10 [IU] via SUBCUTANEOUS
  Filled 2023-08-06 (×6): qty 0.1

## 2023-08-06 MED ORDER — TRAZODONE HCL 50 MG PO TABS
25.0000 mg | ORAL_TABLET | Freq: Every evening | ORAL | Status: DC | PRN
Start: 2023-08-06 — End: 2023-08-10
  Administered 2023-08-07: 25 mg via ORAL
  Filled 2023-08-06 (×2): qty 1

## 2023-08-06 MED ORDER — INSULIN GLARGINE-YFGN 100 UNIT/ML ~~LOC~~ SOLN
5.0000 [IU] | Freq: Once | SUBCUTANEOUS | Status: AC
  Administered 2023-08-06: 5 [IU] via SUBCUTANEOUS
  Filled 2023-08-06: qty 0.05

## 2023-08-06 MED ORDER — LOPERAMIDE HCL 2 MG PO CAPS
2.0000 mg | ORAL_CAPSULE | Freq: Once | ORAL | Status: AC
  Administered 2023-08-07: 2 mg via ORAL
  Filled 2023-08-06: qty 1

## 2023-08-06 NOTE — Progress Notes (Signed)
 PROGRESS NOTE    [name]  WJX:[id number] DOB: July 16, 1945 DOA: 08/02/2023 PCP: [facility]  Subjective: Pt seen and examined.  Pt failed MBS yesterday. Had cortrak placed. Pt is much more awake today. Conversant. Wife is not at bedside.   Pt has sinus tachycardia on telemetry. On IV lopressor  due to dysphagia.   Hospital Course: HPI: Bruce Franklin is a 78 y.o. male with medical history significant for hypertension, hyperlipidemia, CAD, chronic HFmrEF, COPD, and diabetes mellitus who presents with productive cough, fatigue, and confusion.   Patient's wife has become concerned due to the patient's worsening fatigue, confusion, and productive cough.  Symptoms have been worsening over the course of several days.  No fevers have been noted at home but his blood sugar has been higher than usual.     ED Course: Upon arrival to the ED, patient is found to be afebrile and saturating 90% on room air with tachypnea, tachycardia, and stable BP.  Labs are most notable for glucosuria 33, WBC 15,700, lactic acid 2.1, and negative respiratory virus panel.  Chest x-ray is concerning for pneumonia.   Blood cultures were collected and the patient was given a liter of saline, Rocephin , and azithromycin .  Significant Events: Admitted 08/02/2023 for pneumonia   Significant Labs: WBC 15.7, HgB 16.5, plt 214 Lactic acid 2.1 Na 134, k 4.4, CO2 of 21, BUN 32, Scr 0.97, glu 333 Covid/flu/RSV negative   Significant Imaging Studies: CXR Emphysema. Mild ground-glass infiltrate in the left lung base, probable pneumonia. Imaging follow-up to resolution is recommended  Antibiotic Therapy: Anti-infectives (From admission, onward)    Start     Dose/Rate Route Frequency Ordered Stop   08/03/23 1800  azithromycin  (ZITHROMAX ) tablet 500 mg        500 mg Oral Daily 08/03/23 0349 08/07/23 0959   08/03/23 1000  cefTRIAXone  (ROCEPHIN ) 2 g in sodium chloride  0.9 % 100 mL IVPB        2 g 200  mL/hr over 30 Minutes Intravenous Every 24 hours 08/03/23 0349 08/07/23 0959   08/03/23 0015  cefTRIAXone  (ROCEPHIN ) 1 g in sodium chloride  0.9 % 100 mL IVPB        1 g 200 mL/hr over 30 Minutes Intravenous  Once 08/03/23 0005 08/03/23 0123   08/03/23 0015  azithromycin  (ZITHROMAX ) 500 mg in sodium chloride  0.9 % 250 mL IVPB        500 mg 250 mL/hr over 60 Minutes Intravenous  Once 08/03/23 0005 08/03/23 0156       Procedures:   Consultants: cardiology    Assessment and Plan: * Sepsis due to pneumonia (HCC) 08-03-2023 on RA. Sepsis presents on admission due to HR 126, WBC 15.7, lactic acid of 2.1. on IV Rocephin , po zithromax .  Repeat labs in AM. Possible DC to home in AM. PT consult.  08-04-2023 procal slightly elevated at 0.23.  on RA. Continue with IV rocephin /zithromax . He is not wheezing. No role of steroids.  08-04-2023 stable. Change to po duricef and zithromax . On RA. From Pneumonia standpoint, pt is stable for DC  08-05-2023 due to dysphagia/aspiration yesterday on MBS. Back on IV abx.  Oropharyngeal dysphagia 08-06-2023 failed MBS yesterday. Has cortrak for TF. Will have ST re-eval him on Monday to see if 72 hours of enteral nutrition make improvement in his swallowing function.  Rapid atrial fibrillation (HCC) 08-04-2023 went into rapid afib yesterday. Started on IV cardizem . Echo pending. On IV heparin . Will ask cardiology in utility of DCCV vs  chemical cardioversion given his prior hx of ischemic cardiomyopathy. Echo in 2021 showed partially recovered LVEF of 40%.  08-05-2023 echo shows 50-55%. Cards changed pt over to po eliquis  and Toprol -XL 50 mg daily. Awaiting DC clearance from cardiology.  08-06-2023 back in NSR. On IV lopressor  due to dysphagia. On SQ lovenox  due to dysphagia.  Chronic prescription opiate use 08-03-2023 goes to [facility] for opiate Rx. Continue prn oxycodone  10 mg q6h prn.  08-05-2023 stable. Due to excessive somnolence, will  stop oxycodone  and xanax  for now.  08-06-2023 continue to hold opiates due to excessive somnolence. He is more awake now that he has been off oxycodone .  Chronic heart failure with mildly reduced ejection fraction (HFmrEF, 41-49%) (HCC) 08-03-2023 not on GDMT as outpatient.   08-05-2023 stable. LVEF 50-55%. Pt will need f/u with cardiology as outpatient.  08-06-2023 stable.  CAD (coronary artery disease) 08-03-2023 stable. Cont asa.  08-05-2023 stable   08-06-2023 stable.  COPD (chronic obstructive pulmonary disease) (HCC) 08-03-2023 continue prn duonebs. No need for steroids. Not exacerbated.  08-05-2023 stable.  08-06-2023 stable.  Type 2 diabetes mellitus without complication (HCC) 08-03-2023 continue SSI. Change to Ac/hs. Add lantus  5 units daily.  Lab Results  Component Value Date   HGBA1C 9.4 (H) 08/03/2023   08-05-2023 CBG adequate control. Continue with lantus  and SSI.  08-06-2023 now that he is on tube feeds. Will increase lantus  to 10 units daily. On SSI QID  DVT prophylaxis:   Lovenox    Code Status: Full Code Family Communication: no family at bedside Disposition Plan: unknown. Wife refused SNF placement Reason for continuing need for hospitalization: remains NPO. Has cortrak with tube feeds. On IV lopressor , SQ lovenox . Re-evaluate swallowing on Monday.  Objective: Vitals:   08/06/23 0522 08/06/23 0600 08/06/23 0623 08/06/23 0730  BP: (!) 157/88 129/79 129/79 (!) 164/76  Pulse: (!) 108 100 99   Resp: (!) 35  20 18  Temp: 99.2 F (37.3 C) 98.9 F (37.2 C) 98.9 F (37.2 C) 99 F (37.2 C)  TempSrc: Oral Oral Oral Axillary  SpO2: 90% 92% 93% 92%  Weight: 63.4 kg     Height:        Intake/Output Summary (Last 24 hours) at 08/06/2023 1046 Last data filed at 08/06/2023 0527 Gross per 24 hour  Intake 263.8 ml  Output 850 ml  Net -586.2 ml   Filed Weights   08/04/23 1318 08/06/23 0522  Weight: 66.5 kg 63.4 kg    Examination:  Physical  Exam Vitals and nursing note reviewed.  Constitutional:      Comments: awake  HENT:     Head: Normocephalic and atraumatic.     Nose:     Comments: +cortrak in nare Eyes:     General: No scleral icterus. Cardiovascular:     Rate and Rhythm: Normal rate and regular rhythm.  Pulmonary:     Effort: Pulmonary effort is normal. No respiratory distress.     Breath sounds: Normal breath sounds. No wheezing.  Abdominal:     General: Bowel sounds are normal. There is no distension.     Palpations: Abdomen is soft.  Musculoskeletal:     Right lower leg: No edema.     Left lower leg: No edema.  Skin:    General: Skin is warm and dry.     Capillary Refill: Capillary refill takes less than 2 seconds.  Neurological:     Comments: More awake than he has been in last 48 hours.  Data Reviewed: I have personally reviewed following labs and imaging studies  CBC: Recent Labs  Lab 08/02/23 2254 08/03/23 0423 08/04/23 0337 08/05/23 0402 08/06/23 0933  WBC 15.7* 11.9* 10.4 12.6* 9.3  NEUTROABS 14.2*  --  7.8* 9.8* 7.3  HGB 16.5 14.5 16.2 16.2 16.3  HCT 47.9 43.6 46.9 47.2 48.3  MCV 90.7 91.6 91.6 90.4 89.8  PLT 214 204 213 235 257   Basic Metabolic Panel: Recent Labs  Lab 08/02/23 2254 08/03/23 0423 08/04/23 0337 08/05/23 0402 08/05/23 1717  NA 134* 138 138 135  --   K 4.4 4.0 3.2* 4.0  --   CL 100 103 102 102  --   CO2 21* 22 23 21*  --   GLUCOSE 333* 294* 231* 178*  --   BUN 14 13 9 9   --   CREATININE 0.97 0.95 0.72 0.77  --   CALCIUM  9.7 9.0 8.9 8.8*  --   MG  --   --  1.5* 2.0  --   PHOS  --   --   --   --  3.6   GFR: Estimated Creatinine Clearance: 69.3 mL/min (by C-G formula based on SCr of 0.77 mg/dL). Liver Function Tests: Recent Labs  Lab 08/02/23 2254  AST 17  ALT 15  ALKPHOS 41  BILITOT 1.2  PROT 7.3  ALBUMIN 3.7   CBG: Recent Labs  Lab 08/05/23 1722 08/05/23 2057 08/06/23 0019 08/06/23 0427 08/06/23 0953  GLUCAP 160* 183* 173* 221* 210*    Sepsis Labs: Recent Labs  Lab 08/02/23 2254 08/03/23 0040 08/03/23 0820 08/04/23 0337  PROCALCITON  --   --   --  0.23  LATICACIDVEN 2.1* 3.0* 1.5  --     Recent Results (from the past 240 hours)  Resp panel by RT-PCR (RSV, Flu A&B, Covid) Anterior Nasal Swab     Status: None   Collection Time: 08/02/23 10:40 PM   Specimen: Anterior Nasal Swab  Result Value Ref Range Status   SARS Coronavirus 2 by RT PCR NEGATIVE NEGATIVE Final   Influenza A by PCR NEGATIVE NEGATIVE Final   Influenza B by PCR NEGATIVE NEGATIVE Final    Comment: (NOTE) The Xpert Xpress SARS-CoV-2/FLU/RSV plus assay is intended as an aid in the diagnosis of influenza from Nasopharyngeal swab specimens and should not be used as a sole basis for treatment. Nasal washings and aspirates are unacceptable for Xpert Xpress SARS-CoV-2/FLU/RSV testing.  Fact Sheet for Patients: BloggerCourse.com  Fact Sheet for Healthcare Providers: SeriousBroker.it  This test is not yet approved or cleared by the [place]  FDA and has been authorized for detection and/or diagnosis of SARS-CoV-2 by FDA under an Emergency Use Authorization (EUA). This EUA will remain in effect (meaning this test can be used) for the duration of the COVID-19 declaration under Section 564(b)(1) of the Act, 21 U.S.C. section 360bbb-3(b)(1), unless the authorization is terminated or revoked.     Resp Syncytial Virus by PCR NEGATIVE NEGATIVE Final    Comment: (NOTE) Fact Sheet for Patients: BloggerCourse.com  Fact Sheet for Healthcare Providers: SeriousBroker.it  This test is not yet approved or cleared by the [place]  FDA and has been authorized for detection and/or diagnosis of SARS-CoV-2 by FDA under an Emergency Use Authorization (EUA). This EUA will remain in effect (meaning this test can be used) for the duration of the COVID-19  declaration under Section 564(b)(1) of the Act, 21 U.S.C. section 360bbb-3(b)(1), unless the authorization is terminated or revoked.  Performed at  [facility], [address], [place], [place] [address]   Blood culture (routine x 2)     Status: None (Preliminary result)   Collection Time: 08/02/23 11:36 PM   Specimen: BLOOD  Result Value Ref Range Status   Specimen Description BLOOD SITE NOT SPECIFIED  Final   Special Requests   Final    BOTTLES DRAWN AEROBIC AND ANAEROBIC Blood Culture adequate volume   Culture   Final    NO GROWTH 3 DAYS Performed at [facility], [address] [place], [place] [address]    Report Status PENDING  Incomplete  Blood culture (routine x 2)     Status: None (Preliminary result)   Collection Time: 08/02/23 11:41 PM   Specimen: BLOOD  Result Value Ref Range Status   Specimen Description BLOOD BLOOD RIGHT FOREARM  Final   Special Requests   Final    BOTTLES DRAWN AEROBIC AND ANAEROBIC Blood Culture adequate volume   Culture   Final    NO GROWTH 3 DAYS Performed at [facility], [address], [place], [place] [address]    Report Status PENDING  Incomplete     Radiology Studies: DG Swallowing Func-Speech Pathology Result Date: 08/05/2023 Table formatting from the original result was not included. Modified Barium Swallow Study Patient Details Name: WILLI MCCLELAND MRN: [id number] Date of Birth: [birth year] Today's Date: 08/05/2023 HPI/PMH: HPI: Bruce Franklin is a 78 y.o. male with medical history significant for hypertension, hyperlipidemia, CAD, chronic HFmrEF, COPD, and diabetes mellitus who presents with productive cough, fatigue, and confusion. Dx with sepsis due to PNA. REcent admission 08/02/23 for PNA. Note from 4/25 am reports "Patient saturations 87-88% on 1LNC. RN increased patient's oxygen to [facility]. RN changed patient's gown and patient mentioned to RN "I have something in my throat". RN set up suction and suctioned oral cavity.  A piece of sausage and egg was removed. RN made patient and MD aware of findings. Patient is NPO pending speech eval." Clinical Impression: Clinical Impression: Pt has a significant oropharyngeal dysphagia with DIGEST score of 3 suggestive of severe dysfunction. Mentation seems to impact consistency of oral manipulation and transit, sometimes exhibiting brisk responses and other times more repetitive and disorganized. Boluses spill into his pyriform sinuses before the swallow, and sometimes spill over his arytenoids into his airway (PAS 8, silent aspiration). He also has incomplete laryngeal elevation, laryngeal vestibule closure, and epiglottic inversion. Reduced base of tongue retraction and pharyngeal squeeze also lead to residue througout the pharynx and this residue is also aspirated silently (PAS 8). Aspiration occurred regardless of consistency tested. Attempted compensatory strategies but cognitively he was not able to implement them. Recommend that he be NPO. Would place emphasis on oral hygiene to keep up with moisture as much as possible. SLP will follow up for trial of therapy and potential to resume PO diet. DIGEST Swallow Severity Rating*  Safety: 3  Efficiency:1  Overall Pharyngeal Swallow Severity: 3 1: mild; 2: moderate; 3: severe; 4: profound *The Dynamic Imaging Grade of Swallowing Toxicity is standardized for the head and neck cancer population, however, demonstrates promising clinical applications across populations to standardize the clinical rating of pharyngeal swallow safety and severity. Factors that may increase risk of adverse event in presence of aspiration Roderick Civatte & Jessy Morocco 2021): Factors that may increase risk of adverse event in presence of aspiration Roderick Civatte & Jessy Morocco 2021): Poor general health and/or compromised immunity; Respiratory or GI disease; Reduced cognitive function; Frail or  deconditioned; Weak cough; Aspiration of thick, dense, and/or acidic materials; Frequent aspiration  of large volumes Recommendations/Plan: Swallowing Evaluation Recommendations Swallowing Evaluation Recommendations Recommendations: NPO; Alternative means of nutrition - NG Tube Medication Administration: Via alternative means Oral care recommendations: Oral care QID (4x/day) Caregiver Recommendations: Have oral suction available Treatment Plan Treatment Plan Treatment recommendations: Therapy as outlined in treatment plan below Follow-up recommendations: Skilled nursing-short term rehab (<3 hours/day) Functional status assessment: Patient has had a recent decline in their functional status and demonstrates the ability to make significant improvements in function in a reasonable and predictable amount of time. Treatment frequency: Min 2x/week Treatment duration: 2 weeks Interventions: Aspiration precaution training; Compensatory techniques; Patient/family education; Trials of upgraded texture/liquids Recommendations Recommendations for follow up therapy are one component of a multi-disciplinary discharge planning process, led by the attending physician.  Recommendations may be updated based on patient status, additional functional criteria and insurance authorization. Assessment: Orofacial Exam: Orofacial Exam Oral Cavity - Dentition: Edentulous Anatomy: Anatomy: Other (Comment) (anterior curvature of spine) Boluses Administered: Boluses Administered Boluses Administered: Thin liquids (Level 0); Mildly thick liquids (Level 2, nectar thick); Moderately thick liquids (Level 3, honey thick); Puree  Oral Impairment Domain: Oral Impairment Domain Lip Closure: Interlabial escape, no progression to anterior lip Tongue control during bolus hold: Escape to lateral buccal cavity/floor of mouth Bolus preparation/mastication: -- (deferred) Bolus transport/lingual motion: Repetitive/disorganized tongue motion Oral residue: Residue collection on oral structures Location of oral residue : Floor of mouth; Tongue Initiation of  pharyngeal swallow : Pyriform sinuses  Pharyngeal Impairment Domain: Pharyngeal Impairment Domain Soft palate elevation: No bolus between soft palate (SP)/pharyngeal wall (PW) Laryngeal elevation: Partial superior movement of thyroid cartilage/partial approximation of arytenoids to epiglottic petiole Anterior hyoid excursion: Complete anterior movement Epiglottic movement: Partial inversion Laryngeal vestibule closure: Incomplete, narrow column air/contrast in laryngeal vestibule Pharyngeal stripping wave : Present - diminished Pharyngeal contraction (A/P view only): N/A Pharyngoesophageal segment opening: Partial distention/partial duration, partial obstruction of flow Tongue base retraction: Trace column of contrast or air between tongue base and PPW Pharyngeal residue: Collection of residue within or on pharyngeal structures Location of pharyngeal residue: Valleculae; Aryepiglottic folds; Pyriform sinuses  Esophageal Impairment Domain: No data recorded Pill: Pill Consistency administered: -- (deferred) Penetration/Aspiration Scale Score: Penetration/Aspiration Scale Score 8.  Material enters airway, passes BELOW cords without attempt by patient to eject out (silent aspiration) : Thin liquids (Level 0); Mildly thick liquids (Level 2, nectar thick); Moderately thick liquids (Level 3, honey thick); Puree Compensatory Strategies: Compensatory Strategies Compensatory strategies: Yes Chin tuck: Ineffective Ineffective Chin Tuck: Puree   General Information: Caregiver present: No  Diet Prior to this Study: Regular; Thin liquids (Level 0)   Temperature : Normal   Respiratory Status: WFL   Supplemental O2: Nasal cannula   History of Recent Intubation: No  Behavior/Cognition: Alert; Confused; Requires cueing Self-Feeding Abilities: Needs assist with self-feeding Baseline vocal quality/speech: Hypophonia/low volume Volitional Cough: Able to elicit Volitional Swallow: Able to elicit Exam Limitations: No limitations Goal  Planning: Prognosis for improved oropharyngeal function: Fair Barriers to Reach Goals: Severity of deficits; Other (Comment) (unclear PLOF) No data recorded Patient/Family Stated Goal: none stated Consulted and agree with results and recommendations: Patient; Nurse; Physician; Dietitian Pain: Pain Assessment Pain Assessment: Faces Faces Pain Scale: 0 Breathing: 0 Negative Vocalization: 0 Facial Expression: 0 Body Language: 0 Consolability: 0 PAINAD Score: 0 Pain Intervention(s): Monitored during session End of Session: Start Time:SLP Start Time (ACUTE ONLY): 1446 Stop Time: SLP Stop Time (ACUTE ONLY): 1504  Time Calculation:SLP Time Calculation (min) (ACUTE ONLY): 18 min Charges: SLP Evaluations $ SLP Speech Visit: 1 Visit SLP Evaluations $BSS Swallow: 1 Procedure $MBS Swallow: 1 Procedure SLP visit diagnosis: SLP Visit Diagnosis: Dysphagia, oropharyngeal phase (R13.12) Past Medical History: Past Medical History: Diagnosis Date  Acute biliary pancreatitis   Anxiety   Cholelithiasis   COPD (chronic obstructive pulmonary disease) (HCC)   Diabetes mellitus without complication (HCC)   Pneumonia   Prostate cancer (HCC)   Smokes 1 pack of cigarettes per day  Past Surgical History: Past Surgical History: Procedure Laterality Date  CHOLECYSTECTOMY N/A 04/01/2016  Procedure: LAPAROSCOPIC CHOLECYSTECTOMY;  Surgeon: Adalberto Acton, MD;  Location: [facility] OR;  Service: General;  Laterality: N/A;  ERCP N/A 03/30/2016  Procedure: ENDOSCOPIC RETROGRADE CHOLANGIOPANCREATOGRAPHY (ERCP);  Surgeon: Janel Medford, MD;  Location[facility] ENDOSCOPY;  Service: Endoscopy;  Laterality: N/A;  LUMBAR FUSION    PROSTATE SURGERY    ROTATOR CUFF REPAIR   Beth Brooke., M.A. CCC-SLP Acute Rehabilitation Services Office: [phone] Secure chat preferred 08/05/2023, 4:11 PM   Scheduled Meds:  enoxaparin  (LOVENOX ) injection  65 mg Subcutaneous Q12H   feeding supplement (PROSource TF20)  60 mL Per Tube Daily   free water  100 mL Per Tube Q6H   insulin   aspart  0-9 Units Subcutaneous QID   [START ON 08/07/2023] insulin  glargine-yfgn  10 Units Subcutaneous Daily   insulin  glargine-yfgn  5 Units Subcutaneous Once   metoprolol  tartrate  5 mg Intravenous Q6H   multivitamin with minerals  1 tablet Per Tube Daily   sodium chloride  flush  3 mL Intravenous Q12H   thiamine   100 mg Per Tube Daily   Continuous Infusions:  azithromycin      cefTRIAXone  (ROCEPHIN )  IV 1 g (08/06/23 0937)   feeding supplement (OSMOLITE 1.5 CAL) 1,000 mL (08/05/23 2129)     LOS: 3 days   Time spent: 45 minutes  Unk Garb, DO  [facility]  08/06/2023, 10:46 AM

## 2023-08-06 NOTE — Progress Notes (Addendum)
   Patient Name: Bruce Franklin Date of Encounter: 08/06/2023 [facility] Cardiologist: None   Interval Summary  .    Ill appearing. In bed. NGT in place. Mentions wife. No CP.   Vital Signs .    Vitals:   08/06/23 0522 08/06/23 0600 08/06/23 0623 08/06/23 0730  BP: (!) 157/88 129/79 129/79 (!) 164/76  Pulse: (!) 108 100 99   Resp: (!) 35  20 18  Temp: 99.2 F (37.3 C) 98.9 F (37.2 C) 98.9 F (37.2 C) 99 F (37.2 C)  TempSrc: Oral Oral Oral Axillary  SpO2: 90% 92% 93% 92%  Weight: 63.4 kg     Height:        Intake/Output Summary (Last 24 hours) at 08/06/2023 0936 Last data filed at 08/06/2023 0527 Gross per 24 hour  Intake 263.8 ml  Output 850 ml  Net -586.2 ml      08/06/2023    5:22 AM 08/04/2023    1:18 PM 12/25/2017    3:11 PM  Last 3 Weights  Weight (lbs) 139 lb 12.4 oz 146 lb 9.7 oz 160 lb  Weight (kg) 63.4 kg 66.5 kg 72.576 kg      Telemetry/ECG    Sinus tachycardia - Personally Reviewed  Physical Exam .   GEN: No acute distress.  Thin Neck: No JVD, enteral feeding tube in place. Cardiac: Tachy reg, no murmurs, rubs, or gallops.  Respiratory: Clear to auscultation bilaterally. GI: Soft, nontender, non-distended  MS: No edema  Assessment & Plan .     78 year old male with chronic systolic heart failure with moderately reduced ejection fraction secondary to ischemic cardiomyopathy with prior LV apical thrombus diabetes COPD hypertension hyperlipidemia STEMI in 2019 here with paroxysmal atrial fibrillation  Paroxysmal atrial fibrillation Pneumonia secondary to aspiration, severe dysphagia on swallow study - Converted to sinus rhythm/sinus tachycardia in the setting of underlying pneumonia -Resume anticoagulation when able to take p.o.  If Eliquis  is able to be crushed with pure as per recommendations from swallow study I am fine with this.  We will go ahead and sign off.  Please let us  know if we can be of further assistance.  For  questions or updates, please contact [facility] Please consult [email or web address] for contact info under        Signed, Dorothye Gathers, MD

## 2023-08-06 NOTE — Progress Notes (Signed)
 Initial Nutrition Assessment  DOCUMENTATION CODES:   Not applicable   INTERVENTION:  Continue tube feeding via Cortrak: Osmolite 1.5 at 50 ml/h (1200 ml per day) Continue titrating up 10ml/hr q12 hours until goal rate achieved Prosource TF20 60 ml daily FWF q6 hours (400ml daily)  Provides 1880 kcal, 95 gm protein, 1314 ml free water daily   Continue MVI w/ minerals Continue to monitor magnesium , potassium, and phosphorus BID for at least 3 days, MD to replete as needed Continue Thiamine  100 mg daily for 5 days  Monitor speech notes for diet advancement   NUTRITION DIAGNOSIS:  Inadequate oral intake related to inability to eat as evidenced by NPO status.  GOAL:  Patient will meet greater than or equal to 90% of their needs  MONITOR:  TF tolerance, Diet advancement, Skin, Weight trends  REASON FOR ASSESSMENT:   Consult Enteral/tube feeding initiation and management, Assessment of nutrition requirement/status  ASSESSMENT:   Pt with PMH significant for HTN, HLD, CAD, HFrEF, COPD, T2DM. Presented with c/o cough, fatigue, confusion. Symptoms had been progressing over several days prior to admission. Found to be septic d/t PNA.  4/23 admitted  4/24 ECHO: LV 50-55%, mild calcification of aortic valve 4/25 failed MBSS: NPO, Cortrak placed, TF initiated  RD working remotely. Completed chart review to obtain nutrition-related history. Cortrak placed yesterday as patient failed MBS. Attending hopeful that 72 hours of enteral nutrition will improve status enough to encourage swallow.   Attempted to call his wife to collect nutrition-related history. No answer and voice mailbox full. Per chart review, she is main caregiver and is not amicable to SNF placement. They have all DME they will require on discharge. Pt ambulatory with use of DME at baseline.  Spoke with RN via secure chat, who relays that patient is tolerating tube feed well at this time. No N/V/C/D reported.  Recommend continuing monitoring for refeeding as nutrition-related history unable to be collected.  Admit Weight: 66.5kg Current Weight: 63.4kg  No significant weight hx since 2019 in chart to review. Bowels stable. No significant edema documented. Skin intact.   Drains/Lines: Cortrak (68cm) placed 4/25  Lantus  increased today to cover TF titration. Remains on SSI QID. Has required potassium repletion  Meds: SSI 0-9 QID, Semglee  10 daily, MVI, thiamine  Drips: IV ABX  Labs: Na+ 134--->137 (wdl) K+ 3.2--->3.7 (wdl) PHOS 3.6 (wdl) CBGs 178-207 x24 hours  A1c 9.4 (07/2023)  NUTRITION - FOCUSED PHYSICAL EXAM: Deferred to in-person assessment  Diet Order:   Diet Order             Diet NPO time specified  Diet effective now             EDUCATION NEEDS:   Not appropriate for education at this time  Skin:  Skin Assessment: Reviewed RN Assessment  Last BM:  4/25  Height:  Ht Readings from Last 1 Encounters:  08/04/23 6\' 3"  (1.905 m)   Weight:  Wt Readings from Last 1 Encounters:  08/06/23 63.4 kg   Ideal Body Weight:  89.1 kg  BMI:  Body mass index is 17.47 kg/m.  Estimated Nutritional Needs:   Kcal:  1800-2000kcal  Protein:  85-100g  Fluid:  >1.8L/day  Con Decant MS, RD, LDN Registered Dietitian Clinical Nutrition RD Inpatient Contact Info in Amion

## 2023-08-06 NOTE — Plan of Care (Signed)
  Problem: Education: Goal: Ability to describe self-care measures that may prevent or decrease complications (Diabetes Survival Skills Education) will improve Outcome: Progressing   Problem: Coping: Goal: Ability to adjust to condition or change in health will improve Outcome: Progressing   Problem: Fluid Volume: Goal: Ability to maintain a balanced intake and output will improve Outcome: Progressing   Problem: Health Behavior/Discharge Planning: Goal: Ability to identify and utilize available resources and services will improve Outcome: Progressing   Problem: Health Behavior/Discharge Planning: Goal: Ability to manage health-related needs will improve Outcome: Progressing   Problem: Metabolic: Goal: Ability to maintain appropriate glucose levels will improve Outcome: Progressing

## 2023-08-06 NOTE — Assessment & Plan Note (Signed)
 08-06-2023 failed MBS yesterday. Has cortrak for TF. Will have ST re-eval him on Monday to see if 72 hours of enteral nutrition make improvement in his swallowing function.  08-07-2023 pt seems to have improved since Friday. Continue TF today. Repeat MBS tomorrow to assess for improvement. Hopefully he can swallow safely tomorrow.  08-08-2023 failed his MBS again today.  Today's MBS reviewed. Discussed with ST. Discussed results with pt's wife [name]. She is aware that it is absolutely unsafe for pt to take anything by mouth. He is at high risk for aspiration and death due to aspiration pneumonia.   After much discussion, wife is agreeable to PEG tube to allow for enteral nutrition at home. She is still refusing to allow pt to go to SNF. Will stop lovenox . Get PEG tomorrow. Wife will need education on bolus tube feeds. Plan for DC to home on Thursday.  08-09-2023 IR will place G-tube tomorrow. Wife wants pt to be placed into SNF at discharge now after refusing SNF placement.

## 2023-08-07 DIAGNOSIS — E119 Type 2 diabetes mellitus without complications: Secondary | ICD-10-CM | POA: Diagnosis not present

## 2023-08-07 DIAGNOSIS — J189 Pneumonia, unspecified organism: Secondary | ICD-10-CM | POA: Diagnosis not present

## 2023-08-07 DIAGNOSIS — I4891 Unspecified atrial fibrillation: Secondary | ICD-10-CM | POA: Diagnosis not present

## 2023-08-07 DIAGNOSIS — R1312 Dysphagia, oropharyngeal phase: Secondary | ICD-10-CM | POA: Diagnosis not present

## 2023-08-07 LAB — GLUCOSE, CAPILLARY
Glucose-Capillary: 142 mg/dL — ABNORMAL HIGH (ref 70–99)
Glucose-Capillary: 196 mg/dL — ABNORMAL HIGH (ref 70–99)
Glucose-Capillary: 220 mg/dL — ABNORMAL HIGH (ref 70–99)
Glucose-Capillary: 235 mg/dL — ABNORMAL HIGH (ref 70–99)
Glucose-Capillary: 246 mg/dL — ABNORMAL HIGH (ref 70–99)
Glucose-Capillary: 305 mg/dL — ABNORMAL HIGH (ref 70–99)

## 2023-08-07 LAB — BASIC METABOLIC PANEL WITH GFR
Anion gap: 13 (ref 5–15)
BUN: 17 mg/dL (ref 8–23)
CO2: 20 mmol/L — ABNORMAL LOW (ref 22–32)
Calcium: 9.6 mg/dL (ref 8.9–10.3)
Chloride: 104 mmol/L (ref 98–111)
Creatinine, Ser: 0.67 mg/dL (ref 0.61–1.24)
GFR, Estimated: >60 mL/min (ref >60)
Glucose, Bld: 252 mg/dL — ABNORMAL HIGH (ref 70–99)
Potassium: 3.9 mmol/L (ref 3.5–5.1)
Sodium: 137 mmol/L (ref 135–145)

## 2023-08-07 LAB — MAGNESIUM: Magnesium: 1.9 mg/dL (ref 1.7–2.4)

## 2023-08-07 LAB — PHOSPHORUS: Phosphorus: 3.9 mg/dL (ref 2.5–4.6)

## 2023-08-07 MED ORDER — HALOPERIDOL LACTATE 5 MG/ML IJ SOLN
1.0000 mg | Freq: Four times a day (QID) | INTRAMUSCULAR | Status: DC | PRN
  Administered 2023-08-07: 1 mg via INTRAMUSCULAR
  Administered 2023-08-07 – 2023-08-11 (×2): 2 mg via INTRAMUSCULAR
  Filled 2023-08-07 (×3): qty 1

## 2023-08-07 MED ORDER — GERHARDT'S BUTT CREAM
TOPICAL_CREAM | Freq: Two times a day (BID) | CUTANEOUS | Status: DC
  Administered 2023-08-07: 1 via TOPICAL
  Filled 2023-08-07 (×3): qty 60

## 2023-08-07 NOTE — Progress Notes (Signed)
 Patient become more restless and agitated trying to get out of bed and removing Cortrak and ECG leads. Notified Dr. Achilles Holes about the situation and placed an order. Haldol  2mg  IM given. Kept safety precautions on place.

## 2023-08-07 NOTE — Progress Notes (Signed)
 PROGRESS NOTE    Bruce Franklin  CBJ:[id number] DOB: 1946/02/21 DOA: 08/02/2023 PCP: [facility]  Subjective: Pt seen and examined.  Pt remains awake. Appears alert today. Afebrile. On RA.  Events of last night noted. IM haldol  given for agitation.   Hospital Course: HPI: Bruce Franklin is a 78 y.o. male with medical history significant for hypertension, hyperlipidemia, CAD, chronic HFmrEF, COPD, and diabetes mellitus who presents with productive cough, fatigue, and confusion.   Patient's wife has become concerned due to the patient's worsening fatigue, confusion, and productive cough.  Symptoms have been worsening over the course of several days.  No fevers have been noted at home but his blood sugar has been higher than usual.     ED Course: Upon arrival to the ED, patient is found to be afebrile and saturating 90% on room air with tachypnea, tachycardia, and stable BP.  Labs are most notable for glucosuria 33, WBC 15,700, lactic acid 2.1, and negative respiratory virus panel.  Chest x-ray is concerning for pneumonia.   Blood cultures were collected and the patient was given a liter of saline, Rocephin , and azithromycin .  Significant Events: Admitted 08/02/2023 for pneumonia   Significant Labs: WBC 15.7, HgB 16.5, plt 214 Lactic acid 2.1 Na 134, k 4.4, CO2 of 21, BUN 32, Scr 0.97, glu 333 Covid/flu/RSV negative   Significant Imaging Studies: CXR Emphysema. Mild ground-glass infiltrate in the left lung base, probable pneumonia. Imaging follow-up to resolution is recommended  Antibiotic Therapy: Anti-infectives (From admission, onward)    Start     Dose/Rate Route Frequency Ordered Stop   08/03/23 1800  azithromycin  (ZITHROMAX ) tablet 500 mg        500 mg Oral Daily 08/03/23 0349 08/07/23 0959   08/03/23 1000  cefTRIAXone  (ROCEPHIN ) 2 g in sodium chloride  0.9 % 100 mL IVPB        2 g 200 mL/hr over 30 Minutes Intravenous Every 24 hours 08/03/23 0349 08/07/23  0959   08/03/23 0015  cefTRIAXone  (ROCEPHIN ) 1 g in sodium chloride  0.9 % 100 mL IVPB        1 g 200 mL/hr over 30 Minutes Intravenous  Once 08/03/23 0005 08/03/23 0123   08/03/23 0015  azithromycin  (ZITHROMAX ) 500 mg in sodium chloride  0.9 % 250 mL IVPB        500 mg 250 mL/hr over 60 Minutes Intravenous  Once 08/03/23 0005 08/03/23 0156       Procedures:   Consultants: cardiology    Assessment and Plan: * Sepsis due to pneumonia (HCC) 08-03-2023 on RA. Sepsis presents on admission due to HR 126, WBC 15.7, lactic acid of 2.1. on IV Rocephin , po zithromax .  Repeat labs in AM. Possible DC to home in AM. PT consult.  08-04-2023 procal slightly elevated at 0.23.  on RA. Continue with IV rocephin /zithromax . He is not wheezing. No role of steroids.  08-04-2023 stable. Change to po duricef and zithromax . On RA. From Pneumonia standpoint, pt is stable for DC  08-05-2023 due to dysphagia/aspiration yesterday on MBS. Back on IV abx.  08-07-2023 continue with IV abx due to NPO status. Pneumonia appears to be improved. On RA. No fevers.  Oropharyngeal dysphagia 08-06-2023 failed MBS yesterday. Has cortrak for TF. Will have ST re-eval him on Monday to see if 72 hours of enteral nutrition make improvement in his swallowing function.  08-07-2023 pt seems to have improved since Friday. Continue TF today. Repeat MBS tomorrow to assess for improvement. Hopefully he can  swallow safely tomorrow.  Rapid atrial fibrillation (HCC) 08-04-2023 went into rapid afib yesterday. Started on IV cardizem . Echo pending. On IV heparin . Will ask cardiology in utility of DCCV vs chemical cardioversion given his prior hx of ischemic cardiomyopathy. Echo in 2021 showed partially recovered LVEF of 40%.  08-05-2023 echo shows 50-55%. Cards changed pt over to po eliquis  and Toprol -XL 50 mg daily. Awaiting DC clearance from cardiology.  08-06-2023 back in NSR. On IV lopressor  due to dysphagia. On SQ lovenox  due to  dysphagia.  08-07-2023 in NSR. On IV lopressor  due to NPO from dysphagia. On SQ lovenox  due to inability to swallow Eliquis .  Chronic prescription opiate use 08-03-2023 goes to [facility] for opiate Rx. Continue prn oxycodone  10 mg q6h prn.  08-05-2023 stable. Due to excessive somnolence, will stop oxycodone  and xanax  for now.  08-06-2023 continue to hold opiates due to excessive somnolence. He is more awake now that he has been off oxycodone .  08-07-2023 continue to hold oxycodone . He does not appear to be having any withdrawal symptoms. Pt is more awake now that he is off oxycodone .  Chronic heart failure with mildly reduced ejection fraction (HFmrEF, 41-49%) (HCC) 08-03-2023 not on GDMT as outpatient.   08-05-2023 stable. LVEF 50-55%. Pt will need f/u with cardiology as outpatient.  08-06-2023 stable.  08-07-2023 stable.  CAD (coronary artery disease) 08-03-2023 stable. Cont asa.  08-05-2023 stable   08-06-2023 stable.  08-07-2023 stable.  COPD (chronic obstructive pulmonary disease) (HCC) 08-03-2023 continue prn duonebs. No need for steroids. Not exacerbated.  08-05-2023 stable.  08-06-2023 stable.  08-07-2023 stable. Not exacerbated. No need for steroids.  Type 2 diabetes mellitus without complication (HCC) 08-03-2023 continue SSI. Change to Ac/hs. Add lantus  5 units daily.  Lab Results  Component Value Date   HGBA1C 9.4 (H) 08/03/2023   08-05-2023 CBG adequate control. Continue with lantus  and SSI.  08-06-2023 now that he is on tube feeds. Will increase lantus  to 10 units daily. On SSI QID  08-07-2023 lantus  increased to 10 units daily yesterday. Continue with SSI.   DVT prophylaxis:   Lovenox    Code Status: Full Code Family Communication: no family at bedside Disposition Plan: unknown. PT recommended SNF but wife refusing to place pt into SNF. Wants to take patient home. Reason for continuing need for hospitalization: remains NPO. Has  Cortrak with TF. Repeat MBS tomorrow. Remains on IV lopressor  q6h.  Objective: Vitals:   08/07/23 0004 08/07/23 0546 08/07/23 0740 08/07/23 0745  BP: (!) 138/101 (!) 153/96 (!) 140/84   Pulse: (!) 109 (!) 111  95  Resp: 20 19 14  (!) 21  Temp: 97.8 F (36.6 C) 97.8 F (36.6 C)  98 F (36.7 C)  TempSrc: Oral Oral    SpO2: 96% 96%  92%  Weight:  64 kg    Height:        Intake/Output Summary (Last 24 hours) at 08/07/2023 0841 Last data filed at 08/07/2023 0018 Gross per 24 hour  Intake 1490 ml  Output 350 ml  Net 1140 ml   Filed Weights   08/04/23 1318 08/06/23 0522 08/07/23 0546  Weight: 66.5 kg 63.4 kg 64 kg    Examination:  Physical Exam Vitals and nursing note reviewed.  Constitutional:      General: He is not in acute distress.    Appearance: He is not toxic-appearing or diaphoretic.     Comments: awake  HENT:     Head: Normocephalic and atraumatic.  Nose:     Comments: +cortrak in place Cardiovascular:     Rate and Rhythm: Regular rhythm. Tachycardia present.  Pulmonary:     Effort: Pulmonary effort is normal.     Breath sounds: Normal breath sounds.  Abdominal:     General: Bowel sounds are normal. There is no distension.     Palpations: Abdomen is soft.  Musculoskeletal:     Right lower leg: No edema.     Left lower leg: No edema.  Skin:    General: Skin is warm and dry.     Capillary Refill: Capillary refill takes less than 2 seconds.  Neurological:     Comments: Awake. Does not appear oriented.     Data Reviewed: I have personally reviewed following labs and imaging studies  CBC: Recent Labs  Lab 08/02/23 2254 08/03/23 0423 08/04/23 0337 08/05/23 0402 08/06/23 0933  WBC 15.7* 11.9* 10.4 12.6* 9.3  NEUTROABS 14.2*  --  7.8* 9.8* 7.3  HGB 16.5 14.5 16.2 16.2 16.3  HCT 47.9 43.6 46.9 47.2 48.3  MCV 90.7 91.6 91.6 90.4 89.8  PLT 214 204 213 235 257   Basic Metabolic Panel: Recent Labs  Lab 08/03/23 0423 08/04/23 0337 08/05/23 0402  08/05/23 1717 08/06/23 0933 08/07/23 0304  NA 138 138 135  --  137 137  K 4.0 3.2* 4.0  --  3.7 3.9  CL 103 102 102  --  105 104  CO2 22 23 21*  --  20* 20*  GLUCOSE 294* 231* 178*  --  207* 252*  BUN 13 9 9   --  13 17  CREATININE 0.95 0.72 0.77  --  0.75 0.67  CALCIUM  9.0 8.9 8.8*  --  9.5 9.6  MG  --  1.5* 2.0  --  1.9 1.9  PHOS  --   --   --  3.6 3.6 3.9   GFR: Estimated Creatinine Clearance: 70 mL/min (by C-G formula based on SCr of 0.67 mg/dL). Liver Function Tests: Recent Labs  Lab 08/02/23 2254  AST 17  ALT 15  ALKPHOS 41  BILITOT 1.2  PROT 7.3  ALBUMIN 3.7   CBG: Recent Labs  Lab 08/06/23 1316 08/06/23 1629 08/06/23 2011 08/07/23 0003 08/07/23 0427  GLUCAP 257* 165* 160* 196* 246*   Sepsis Labs: Recent Labs  Lab 08/02/23 2254 08/03/23 0040 08/03/23 0820 08/04/23 0337  PROCALCITON  --   --   --  0.23  LATICACIDVEN 2.1* 3.0* 1.5  --     Recent Results (from the past 240 hours)  Resp panel by RT-PCR (RSV, Flu A&B, Covid) Anterior Nasal Swab     Status: None   Collection Time: 08/02/23 10:40 PM   Specimen: Anterior Nasal Swab  Result Value Ref Range Status   SARS Coronavirus 2 by RT PCR NEGATIVE NEGATIVE Final   Influenza A by PCR NEGATIVE NEGATIVE Final   Influenza B by PCR NEGATIVE NEGATIVE Final    Comment: (NOTE) The Xpert Xpress SARS-CoV-2/FLU/RSV plus assay is intended as an aid in the diagnosis of influenza from Nasopharyngeal swab specimens and should not be used as a sole basis for treatment. Nasal washings and aspirates are unacceptable for Xpert Xpress SARS-CoV-2/FLU/RSV testing.  Fact Sheet for Patients: BloggerCourse.com  Fact Sheet for Healthcare Providers: SeriousBroker.it  This test is not yet approved or cleared by the [place]  FDA and has been authorized for detection and/or diagnosis of SARS-CoV-2 by FDA under an Emergency Use Authorization (EUA). This EUA will  remain  in effect (meaning this test can be used) for the duration of the COVID-19 declaration under Section 564(b)(1) of the Act, 21 U.S.C. section 360bbb-3(b)(1), unless the authorization is terminated or revoked.     Resp Syncytial Virus by PCR NEGATIVE NEGATIVE Final    Comment: (NOTE) Fact Sheet for Patients: BloggerCourse.com  Fact Sheet for Healthcare Providers: SeriousBroker.it  This test is not yet approved or cleared by the [place]  FDA and has been authorized for detection and/or diagnosis of SARS-CoV-2 by FDA under an Emergency Use Authorization (EUA). This EUA will remain in effect (meaning this test can be used) for the duration of the COVID-19 declaration under Section 564(b)(1) of the Act, 21 U.S.C. section 360bbb-3(b)(1), unless the authorization is terminated or revoked.  Performed at [facility], [address], [place], [place] [address]   Blood culture (routine x 2)     Status: None (Preliminary result)   Collection Time: 08/02/23 11:36 PM   Specimen: BLOOD  Result Value Ref Range Status   Specimen Description BLOOD SITE NOT SPECIFIED  Final   Special Requests   Final    BOTTLES DRAWN AEROBIC AND ANAEROBIC Blood Culture adequate volume   Culture   Final    NO GROWTH 3 DAYS Performed at [facility], [address] [place], [place] [address]    Report Status PENDING  Incomplete  Blood culture (routine x 2)     Status: None (Preliminary result)   Collection Time: 08/02/23 11:41 PM   Specimen: BLOOD  Result Value Ref Range Status   Specimen Description BLOOD BLOOD RIGHT FOREARM  Final   Special Requests   Final    BOTTLES DRAWN AEROBIC AND ANAEROBIC Blood Culture adequate volume   Culture   Final    NO GROWTH 3 DAYS Performed at [facility], [address], [place], [place] [address]    Report Status PENDING  Incomplete     Radiology Studies: DG Swallowing Func-Speech  Pathology Result Date: 08/05/2023 Table formatting from the original result was not included. Modified Barium Swallow Study Patient Details Name: Bruce Franklin MRN: [id number] Date of Birth: [birth year] Today's Date: 08/05/2023 HPI/PMH: HPI: Bruce Franklin is a 78 y.o. male with medical history significant for hypertension, hyperlipidemia, CAD, chronic HFmrEF, COPD, and diabetes mellitus who presents with productive cough, fatigue, and confusion. Dx with sepsis due to PNA. REcent admission 08/02/23 for PNA. Note from 4/25 am reports "Patient saturations 87-88% on 1LNC. RN increased patient's oxygen to [facility]. RN changed patient's gown and patient mentioned to RN "I have something in my throat". RN set up suction and suctioned oral cavity. A piece of sausage and egg was removed. RN made patient and MD aware of findings. Patient is NPO pending speech eval." Clinical Impression: Clinical Impression: Pt has a significant oropharyngeal dysphagia with DIGEST score of 3 suggestive of severe dysfunction. Mentation seems to impact consistency of oral manipulation and transit, sometimes exhibiting brisk responses and other times more repetitive and disorganized. Boluses spill into his pyriform sinuses before the swallow, and sometimes spill over his arytenoids into his airway (PAS 8, silent aspiration). He also has incomplete laryngeal elevation, laryngeal vestibule closure, and epiglottic inversion. Reduced base of tongue retraction and pharyngeal squeeze also lead to residue througout the pharynx and this residue is also aspirated silently (PAS 8). Aspiration occurred regardless of consistency tested. Attempted compensatory strategies but cognitively he was not able to implement them. Recommend that he be NPO. Would  place emphasis on oral hygiene to keep up with moisture as much as possible. SLP will follow up for trial of therapy and potential to resume PO diet. DIGEST Swallow Severity Rating*  Safety: 3  Efficiency:1  Overall  Pharyngeal Swallow Severity: 3 1: mild; 2: moderate; 3: severe; 4: profound *The Dynamic Imaging Grade of Swallowing Toxicity is standardized for the head and neck cancer population, however, demonstrates promising clinical applications across populations to standardize the clinical rating of pharyngeal swallow safety and severity. Factors that may increase risk of adverse event in presence of aspiration Roderick Civatte & Jessy Morocco 2021): Factors that may increase risk of adverse event in presence of aspiration Roderick Civatte & Jessy Morocco 2021): Poor general health and/or compromised immunity; Respiratory or GI disease; Reduced cognitive function; Frail or deconditioned; Weak cough; Aspiration of thick, dense, and/or acidic materials; Frequent aspiration of large volumes Recommendations/Plan: Swallowing Evaluation Recommendations Swallowing Evaluation Recommendations Recommendations: NPO; Alternative means of nutrition - NG Tube Medication Administration: Via alternative means Oral care recommendations: Oral care QID (4x/day) Caregiver Recommendations: Have oral suction available Treatment Plan Treatment Plan Treatment recommendations: Therapy as outlined in treatment plan below Follow-up recommendations: Skilled nursing-short term rehab (<3 hours/day) Functional status assessment: Patient has had a recent decline in their functional status and demonstrates the ability to make significant improvements in function in a reasonable and predictable amount of time. Treatment frequency: Min 2x/week Treatment duration: 2 weeks Interventions: Aspiration precaution training; Compensatory techniques; Patient/family education; Trials of upgraded texture/liquids Recommendations Recommendations for follow up therapy are one component of a multi-disciplinary discharge planning process, led by the attending physician.  Recommendations may be updated based on patient status, additional functional criteria and insurance authorization. Assessment:  Orofacial Exam: Orofacial Exam Oral Cavity - Dentition: Edentulous Anatomy: Anatomy: Other (Comment) (anterior curvature of spine) Boluses Administered: Boluses Administered Boluses Administered: Thin liquids (Level 0); Mildly thick liquids (Level 2, nectar thick); Moderately thick liquids (Level 3, honey thick); Puree  Oral Impairment Domain: Oral Impairment Domain Lip Closure: Interlabial escape, no progression to anterior lip Tongue control during bolus hold: Escape to lateral buccal cavity/floor of mouth Bolus preparation/mastication: -- (deferred) Bolus transport/lingual motion: Repetitive/disorganized tongue motion Oral residue: Residue collection on oral structures Location of oral residue : Floor of mouth; Tongue Initiation of pharyngeal swallow : Pyriform sinuses  Pharyngeal Impairment Domain: Pharyngeal Impairment Domain Soft palate elevation: No bolus between soft palate (SP)/pharyngeal wall (PW) Laryngeal elevation: Partial superior movement of thyroid cartilage/partial approximation of arytenoids to epiglottic petiole Anterior hyoid excursion: Complete anterior movement Epiglottic movement: Partial inversion Laryngeal vestibule closure: Incomplete, narrow column air/contrast in laryngeal vestibule Pharyngeal stripping wave : Present - diminished Pharyngeal contraction (A/P view only): N/A Pharyngoesophageal segment opening: Partial distention/partial duration, partial obstruction of flow Tongue base retraction: Trace column of contrast or air between tongue base and PPW Pharyngeal residue: Collection of residue within or on pharyngeal structures Location of pharyngeal residue: Valleculae; Aryepiglottic folds; Pyriform sinuses  Esophageal Impairment Domain: No data recorded Pill: Pill Consistency administered: -- (deferred) Penetration/Aspiration Scale Score: Penetration/Aspiration Scale Score 8.  Material enters airway, passes BELOW cords without attempt by patient to eject out (silent aspiration) :  Thin liquids (Level 0); Mildly thick liquids (Level 2, nectar thick); Moderately thick liquids (Level 3, honey thick); Puree Compensatory Strategies: Compensatory Strategies Compensatory strategies: Yes Chin tuck: Ineffective Ineffective Chin Tuck: Puree   General Information: Caregiver present: No  Diet Prior to this Study: Regular; Thin liquids (Level 0)   Temperature : Normal   Respiratory Status:  WFL   Supplemental O2: Nasal cannula   History of Recent Intubation: No  Behavior/Cognition: Alert; Confused; Requires cueing Self-Feeding Abilities: Needs assist with self-feeding Baseline vocal quality/speech: Hypophonia/low volume Volitional Cough: Able to elicit Volitional Swallow: Able to elicit Exam Limitations: No limitations Goal Planning: Prognosis for improved oropharyngeal function: Fair Barriers to Reach Goals: Severity of deficits; Other (Comment) (unclear PLOF) No data recorded Patient/Family Stated Goal: none stated Consulted and agree with results and recommendations: Patient; Nurse; Physician; Dietitian Pain: Pain Assessment Pain Assessment: Faces Faces Pain Scale: 0 Breathing: 0 Negative Vocalization: 0 Facial Expression: 0 Body Language: 0 Consolability: 0 PAINAD Score: 0 Pain Intervention(s): Monitored during session End of Session: Start Time:SLP Start Time (ACUTE ONLY): 1446 Stop Time: SLP Stop Time (ACUTE ONLY): 1504 Time Calculation:SLP Time Calculation (min) (ACUTE ONLY): 18 min Charges: SLP Evaluations $ SLP Speech Visit: 1 Visit SLP Evaluations $BSS Swallow: 1 Procedure $MBS Swallow: 1 Procedure SLP visit diagnosis: SLP Visit Diagnosis: Dysphagia, oropharyngeal phase (R13.12) Past Medical History: Past Medical History: Diagnosis Date  Acute biliary pancreatitis   Anxiety   Cholelithiasis   COPD (chronic obstructive pulmonary disease) (HCC)   Diabetes mellitus without complication (HCC)   Pneumonia   Prostate cancer (HCC)   Smokes 1 pack of cigarettes per day  Past Surgical History: Past  Surgical History: Procedure Laterality Date  CHOLECYSTECTOMY N/A 04/01/2016  Procedure: LAPAROSCOPIC CHOLECYSTECTOMY;  Surgeon: Adalberto Acton, MD;  Location: [facility] OR;  Service: General;  Laterality: N/A;  ERCP N/A 03/30/2016  Procedure: ENDOSCOPIC RETROGRADE CHOLANGIOPANCREATOGRAPHY (ERCP);  Surgeon: Janel Medford, MD;  Location[facility] ENDOSCOPY;  Service: Endoscopy;  Laterality: N/A;  LUMBAR FUSION    PROSTATE SURGERY    ROTATOR CUFF REPAIR   Beth Brooke., M.A. CCC-SLP Acute Rehabilitation Services Office: [phone] Secure chat preferred 08/05/2023, 4:11 PM   Scheduled Meds:  enoxaparin  (LOVENOX ) injection  65 mg Subcutaneous Q12H   feeding supplement (PROSource TF20)  60 mL Per Tube Daily   free water  100 mL Per Tube Q6H   insulin  aspart  0-9 Units Subcutaneous QID   insulin  glargine-yfgn  10 Units Subcutaneous Daily   metoprolol  tartrate  5 mg Intravenous Q6H   multivitamin with minerals  1 tablet Per Tube Daily   sodium chloride  flush  3 mL Intravenous Q12H   thiamine   100 mg Per Tube Daily   Continuous Infusions:  azithromycin  500 mg (08/06/23 1046)   cefTRIAXone  (ROCEPHIN )  IV 1 g (08/06/23 0937)   feeding supplement (OSMOLITE 1.5 CAL) 1,000 mL (08/06/23 2129)     LOS: 4 days   Time spent: 45 minutes  Unk Garb, DO  [facility]  08/07/2023, 8:41 AM

## 2023-08-07 NOTE — Progress Notes (Signed)
 Erla Haw -son [phone]   Called the unit and requested an update from a physician. Forwarded contact information to [facility] on for this shift.

## 2023-08-07 NOTE — Plan of Care (Signed)
  Problem: Education: Goal: Ability to describe self-care measures that may prevent or decrease complications (Diabetes Survival Skills Education) will improve Outcome: Not Progressing   Problem: Coping: Goal: Ability to adjust to condition or change in health will improve Outcome: Not Progressing   Problem: Health Behavior/Discharge Planning: Goal: Ability to identify and utilize available resources and services will improve Outcome: Not Progressing   Problem: Education: Goal: Knowledge of General Education information will improve Description: Including pain rating scale, medication(s)/side effects and non-pharmacologic comfort measures Outcome: Not Progressing   Problem: Health Behavior/Discharge Planning: Goal: Ability to manage health-related needs will improve Outcome: Not Progressing   Problem: Education: Goal: Ability to describe self-care measures that may prevent or decrease complications (Diabetes Survival Skills Education) will improve Outcome: Not Progressing   Problem: Coping: Goal: Ability to adjust to condition or change in health will improve Outcome: Not Progressing   Problem: Health Behavior/Discharge Planning: Goal: Ability to identify and utilize available resources and services will improve Outcome: Not Progressing   Problem: Education: Goal: Knowledge of General Education information will improve Description: Including pain rating scale, medication(s)/side effects and non-pharmacologic comfort measures Outcome: Not Progressing   Problem: Health Behavior/Discharge Planning: Goal: Ability to manage health-related needs will improve Outcome: Not Progressing

## 2023-08-08 ENCOUNTER — Inpatient Hospital Stay (HOSPITAL_COMMUNITY)

## 2023-08-08 DIAGNOSIS — J410 Simple chronic bronchitis: Secondary | ICD-10-CM | POA: Diagnosis not present

## 2023-08-08 DIAGNOSIS — J189 Pneumonia, unspecified organism: Secondary | ICD-10-CM | POA: Diagnosis not present

## 2023-08-08 DIAGNOSIS — R1312 Dysphagia, oropharyngeal phase: Secondary | ICD-10-CM | POA: Diagnosis not present

## 2023-08-08 DIAGNOSIS — Z66 Do not resuscitate: Secondary | ICD-10-CM | POA: Diagnosis not present

## 2023-08-08 DIAGNOSIS — L8990 Pressure ulcer of unspecified site, unspecified stage: Secondary | ICD-10-CM | POA: Insufficient documentation

## 2023-08-08 LAB — MAGNESIUM: Magnesium: 2 mg/dL (ref 1.7–2.4)

## 2023-08-08 LAB — CULTURE, BLOOD (ROUTINE X 2)
Culture: NO GROWTH
Culture: NO GROWTH
Special Requests: ADEQUATE
Special Requests: ADEQUATE

## 2023-08-08 LAB — GLUCOSE, CAPILLARY
Glucose-Capillary: 199 mg/dL — ABNORMAL HIGH (ref 70–99)
Glucose-Capillary: 201 mg/dL — ABNORMAL HIGH (ref 70–99)
Glucose-Capillary: 219 mg/dL — ABNORMAL HIGH (ref 70–99)
Glucose-Capillary: 212 mg/dL — ABNORMAL HIGH (ref 70–99)
Glucose-Capillary: 217 mg/dL — ABNORMAL HIGH (ref 70–99)
Glucose-Capillary: 184 mg/dL — ABNORMAL HIGH (ref 70–99)
Glucose-Capillary: 180 mg/dL — ABNORMAL HIGH (ref 70–99)

## 2023-08-08 LAB — PHOSPHORUS: Phosphorus: 4.2 mg/dL (ref 2.5–4.6)

## 2023-08-08 MED ORDER — DEXTROSE IN LACTATED RINGERS 5 % IV SOLN: INTRAVENOUS | Status: AC

## 2023-08-08 NOTE — Progress Notes (Signed)
 SaO2 90% on RA, O2 reapplied at 1l [place].

## 2023-08-08 NOTE — Assessment & Plan Note (Signed)
 Pressure Injury 08/07/23 Sacrum Medial Stage 1 -  Intact skin with non-blanchable redness of a localized area usually over a bony prominence. (Active)  08/07/23 1238  Location: Sacrum  Location Orientation: Medial  Staging: Stage 1 -  Intact skin with non-blanchable redness of a localized area usually over a bony prominence.  Wound Description (Comments):   Present on Admission:

## 2023-08-08 NOTE — Progress Notes (Signed)
   Discussed with pt's son adonis conto. Son does want pt to go SNF. He also agrees with pt's wife that pt will NOT get any home services including PT,OT,ST, RN. Son states he will "hire someone to help".  Discussed that pt failed his MBS on Friday and has had 72 hours of enteral nutrition. Plan on repeating MBS today to see if enteral nutrition will improve his swallowing.  If pt is able to swallow, then there is nothing else keeping him in the hospital. If wife and son refuse for pt to go to SNF then pt will be discharged to home. Since wife and son are refusing to allow for home health to provide services for home in combination with wife and son refusing for patient to go to SNF, the patient is a HIGH re-admission risk.  Son states he "is going to pray" that pt's swallowing will be better. While I agree with son praying for a good outcome today, I asked if son and pt's wife would allow for patient to have a PEG if patient failed his MBS.  Son refused to give an answer. Defer to pt's wife. Discussed that pt's wife was not making decisions in the best interest of the patient thus far(I.e. refusing SNF and refusing home health care).  Despite multiple attempts, son refused to answer if he or pt's wife would allow for PEG to be placed.  At the end of conversation, MBS will be performed today. Family will be notified of results.  If pt passes MBS, pt will be discharged to home(I.e. no SNF) and without any home health services since these are refused by both the son and pt's wife.  Should be noted that the pt's son is NOT on pt's contact list as of 08-08-2023 @ 7:30 AM. Son was update as a courtesy since he called the floor yesterday.     Unk Garb, DO [facility]

## 2023-08-08 NOTE — NC FL2 (Signed)
 [place]  MEDICAID FL2 LEVEL OF CARE FORM     IDENTIFICATION  Patient Name: Bruce Franklin Birthdate: [birth year] Sex: male Admission Date (Current Location): 08/02/2023  [facility] and [place] Number:  Producer, television/film/video and Address:  [facility] [place], [address], [place], [place] [address]      Provider Number: [id number]  Attending Physician Name and Address:  Unk Garb, DO  Relative Name and Phone Number:       Current Level of Care: Hospital Recommended Level of Care: Skilled Nursing Facility Prior Approval Number:    Date Approved/Denied:   PASRR Number: [id number]  Discharge Plan: SNF    Current Diagnoses: Patient Active Problem List   Diagnosis Date Noted   Pressure injury of skin 08/08/2023   DNR (do not resuscitate)/DNI(Do Not Intubate) 08/08/2023   Oropharyngeal dysphagia 08/06/2023   Sepsis due to pneumonia (HCC) 08/03/2023   CAD (coronary artery disease) 08/03/2023   Chronic heart failure with mildly reduced ejection fraction (HFmrEF, 41-49%) (HCC) 08/03/2023   Chronic prescription opiate use 08/03/2023   Rapid atrial fibrillation (HCC) 08/03/2023   Benign essential hypertension 08/15/2018   COPD (chronic obstructive pulmonary disease) (HCC)    Diabetes mellitus without complication (HCC)    Prostate cancer (HCC)    Type 2 diabetes mellitus without complication (HCC)    Malnutrition of moderate degree (HCC) 11/15/2014   TOBACCO DEPENDENCE 06/09/2006   NEPHROLITHIASIS 06/09/2006   BACK PAIN, LOW 06/09/2006    Orientation RESPIRATION BLADDER Height & Weight     Self  Normal Incontinent, External catheter Weight: 143 lb 15.4 oz (65.3 kg) Height:  6\' 3"  (190.5 cm)  BEHAVIORAL SYMPTOMS/MOOD NEUROLOGICAL BOWEL NUTRITION STATUS      Incontinent Diet (Has cortrak will be removed tom 4/29 - replaced with G Tube per MD)  AMBULATORY STATUS COMMUNICATION OF NEEDS Skin   Extensive Assist Verbally (has difficulty speaking) PU Stage  and Appropriate Care (Pressure Injury -Sacrum Medial Stage 1)                       Personal Care Assistance Level of Assistance  Bathing, Dressing, Feeding Bathing Assistance:  (see dc summary) Feeding assistance:  (dc summary) Dressing Assistance:  (see dc summary)     Functional Limitations Info  Sight, Hearing, Speech Sight Info: Impaired (eyeglasses) Hearing Info: Adequate Speech Info: Impaired (difficulty speaking)    SPECIAL CARE FACTORS FREQUENCY  PT (By licensed PT), OT (By licensed OT), Speech therapy     PT Frequency: 5x week OT Frequency: 5x week     Speech Therapy Frequency: 5x week      Contractures Contractures Info: Not present    Additional Factors Info  Code Status, Allergies, Insulin  Sliding Scale Code Status Info: DNR limited Allergies Info: Ibuprofen, Sulfa Antibiotics   Insulin  Sliding Scale Info: see dc summary       Current Medications (08/08/2023):  This is the current hospital active medication list Current Facility-Administered Medications  Medication Dose Route Frequency Provider Last Rate Last Admin   acetaminophen  (TYLENOL ) tablet 650 mg  650 mg Oral Q6H PRN Opyd, Timothy S, MD   650 mg at 08/07/23 0146   Or   acetaminophen  (TYLENOL ) suppository 650 mg  650 mg Rectal Q6H PRN Opyd, Timothy S, MD       azithromycin  (ZITHROMAX ) 500 mg in sodium chloride  0.9 % 250 mL IVPB  500 mg Intravenous Q24H Unk Garb, DO 250 mL/hr at 08/08/23 1128 500  mg at 08/08/23 1128   cefTRIAXone  (ROCEPHIN ) 1 g in sodium chloride  0.9 % 100 mL IVPB  1 g Intravenous Q24H Unk Garb, DO 200 mL/hr at 08/08/23 0822 1 g at 08/08/23 0822   [START ON 08/09/2023] dextrose  5 % in lactated ringers  infusion   Intravenous Continuous Unk Garb, DO       feeding supplement (OSMOLITE 1.5 CAL) liquid 1,000 mL  1,000 mL Per Tube Continuous Unk Garb, DO 50 mL/hr at 08/06/23 2129 1,000 mL at 08/06/23 2129   feeding supplement (PROSource TF20) liquid 60 mL  60 mL Per Tube Daily  Unk Garb, DO   60 mL at 08/08/23 1111   free water 100 mL  100 mL Per Tube Q6H Unk Garb, DO   100 mL at 08/08/23 1113   Gerhardt's butt cream   Topical BID Unk Garb, DO   Given at 08/08/23 0900   guaiFENesin  (ROBITUSSIN) 100 MG/5ML liquid 5 mL  5 mL Oral Q4H PRN Opyd, Timothy S, MD   5 mL at 08/06/23 0933   haloperidol  lactate (HALDOL ) injection 1-2 mg  1-2 mg Intramuscular Q6H PRN Mansy, Jan A, MD   1 mg at 08/07/23 1539   insulin  aspart (novoLOG ) injection 0-9 Units  0-9 Units Subcutaneous QID Unk Garb, DO   3 Units at 08/08/23 1529   insulin  glargine-yfgn (SEMGLEE ) injection 10 Units  10 Units Subcutaneous Daily Unk Garb, DO   10 Units at 08/08/23 1111   ipratropium-albuterol  (DUONEB) 0.5-2.5 (3) MG/3ML nebulizer solution 3 mL  3 mL Nebulization Q4H PRN Opyd, Timothy S, MD       metoprolol  tartrate (LOPRESSOR ) injection 5 mg  5 mg Intravenous Q6H Unk Garb, DO   5 mg at 08/08/23 1111   morphine  (PF) 2 MG/ML injection 2 mg  2 mg Intravenous Q2H PRN Unk Garb, DO   2 mg at 08/07/23 1538   multivitamin with minerals tablet 1 tablet  1 tablet Per Tube Daily Unk Garb, DO   1 tablet at 08/08/23 1111   ondansetron  (ZOFRAN ) tablet 4 mg  4 mg Oral Q6H PRN Opyd, Timothy S, MD       Or   ondansetron  (ZOFRAN ) injection 4 mg  4 mg Intravenous Q6H PRN Opyd, Timothy S, MD       sodium chloride  flush (NS) 0.9 % injection 3 mL  3 mL Intravenous Q12H Opyd, Timothy S, MD   3 mL at 08/08/23 1130   thiamine  (VITAMIN B1) tablet 100 mg  100 mg Per Tube Daily Unk Garb, DO   100 mg at 08/08/23 1111   traZODone (DESYREL) tablet 25 mg  25 mg Oral QHS PRN Mansy, Jan A, MD   25 mg at 08/07/23 0017     Discharge Medications: Please see discharge summary for a list of discharge medications.  Relevant Imaging Results:  Relevant Lab Results:   Additional Information SSN [id number]; cortrak removal on 4/29 (will have G Tube)  Bruce Franklin Bruce Franklin, LCSWA

## 2023-08-08 NOTE — Progress Notes (Addendum)
   Today's MBS reviewed. Discussed with ST. Discussed results with pt's wife [name]. She is aware that it is absolutely unsafe for pt to take anything by mouth. He is at high risk for aspiration and death due to aspiration pneumonia.  After much discussion, wife is agreeable to PEG tube to allow for enteral nutrition at home. She is still refusing to allow pt to go to SNF.  Will stop lovenox . Get PEG tomorrow. Wife will need education on bolus tube feeds. Plan for DC to home on Thursday.  Also discussed code status. Wife does not want pt to have CPR or be intubated for cardiac or respiratory arrest. Code status changed to DNR/DNI.  Unk Garb, DO [facility]

## 2023-08-08 NOTE — Plan of Care (Signed)
 Diarrhea continues, perineal skin with moisture associated injury, requires frequent cleaning and application of barrier ointment.

## 2023-08-08 NOTE — Progress Notes (Signed)
 Physical Therapy Treatment Patient Details Name: Bruce Franklin MRN: [id number] DOB: [birth year] Today's Date: 08/08/2023   History of Present Illness Pt is 78 yo presenting [facility] ED on 4/22 due to worsening cough, fatigue and confusion. Chest x-ray is concerning for pneumonia. PMH: HTN, hyperlipidemia, CAD, chronic HFmrEF, COPD, DM, ERCP    PT Comments  Pt greeted supine in bed, wife present in room, pleasant and agreeable to PT session. He was found to be soiled, so engaged in rolling R/L with modA and use of bed rail for pericare to be addressed. Pt continues to required modA for supine>sit. He initially required modA to maintain seated balance EOB, but was able to lessen to CGA for safety and BUE support. Pt advanced transfers performing a bed>chair step pivot with minA x2. Patient will benefit from continued inpatient follow up therapy, <3 hours/day.    If plan is discharge home, recommend the following: Assist for transportation;Assistance with cooking/housework;Help with stairs or ramp for entrance;Two people to help with walking and/or transfers;A lot of help with bathing/dressing/bathroom   Can travel by private vehicle     No  Equipment Recommendations  Wheelchair (measurements PT);Wheelchair cushion (measurements PT);BSC/3in1;Hospital bed    Recommendations for Other Services       Precautions / Restrictions Precautions Precautions: Fall Recall of Precautions/Restrictions: Impaired Precaution/Restrictions Comments: cortrak Restrictions Weight Bearing Restrictions Per Provider Order: No     Mobility  Bed Mobility Overal bed mobility: Needs Assistance Bed Mobility: Rolling, Supine to Sit Rolling: Mod assist, Used rails   Supine to sit: Mod assist, +2 for safety/equipment     General bed mobility comments: Pt found soiled in bed. Rolled R/L with VC for sequencing. ModA at trunk/pelvis to faciliate turn with use of bed pad and pt maintaining sidelying by holding onto  rail.  Pt sat up on R side of bed with increased time. ModA to bring BLE off EOB and at trunk to reach upright.    Transfers Overall transfer level: Needs assistance Equipment used: 2 person hand held assist Transfers: Sit to/from Stand, Bed to chair/wheelchair/BSC Sit to Stand: Min assist, +2 physical assistance, From elevated surface   Step pivot transfers: Min assist, +2 physical assistance, +2 safety/equipment, From elevated surface       General transfer comment: Pt stood from raised bed height with UE support on PT/[employer]obility [employer]ists elbow/posterior upper arm. MinA x2 to power up into standing. Pt pivoted to recliner chair on his R by taking short shuffling steps to slowly make it to the chair. Good eccentric control with sitting.    Ambulation/Gait               General Gait Details: Deferred secondary to fatigue   Stairs             Wheelchair Mobility     Tilt Bed    Modified Rankin (Stroke Patients Only)       Balance Overall balance assessment: Needs assistance Sitting-balance support: Bilateral upper extremity supported, Feet supported Sitting balance-Leahy Scale: Poor Sitting balance - Comments: Pt initially required modA to maintain seated balance EOB. Able to lessen to CGA for safety. Postural control: Posterior lean Standing balance support: Bilateral upper extremity supported, During functional activity Standing balance-Leahy Scale: Poor Standing balance comment: Pt dependent on BUE support and minA x2 for stability.                            Communication  Communication Communication: Impaired Factors Affecting Communication: Reduced clarity of speech;Difficulty expressing self  Cognition Arousal: Alert Behavior During Therapy: WFL for tasks assessed/performed   PT - Cognitive impairments: Orientation, Awareness, Safety/Judgement, Initiation, Sequencing, Attention, Memory   Orientation impairments: Time, Situation                      Following commands: Impaired Following commands impaired: Follows one step commands with increased time    Cueing Cueing Techniques: Verbal cues, Gestural cues, Tactile cues, Visual cues  Exercises      General Comments General comments (skin integrity, edema, etc.): Pt found soiled in bed from BM. Pericare addresssed. RN called to examine redness present in pericare area, voiced it was a moisture associated skin damage, and applied cream and powder to the area. Discussed d/c planning with pt/wife. Wife present throughout session providing encouragement.      Pertinent Vitals/Pain Pain Assessment Pain Assessment: Faces Faces Pain Scale: Hurts little more Pain Location: pericare area with cleaning and R hip in sidelying Pain Descriptors / Indicators: Discomfort, Moaning, Grimacing, Guarding Pain Intervention(s): Limited activity within patient's tolerance, Monitored during session, Repositioned    Home Living                          Prior Function            PT Goals (current goals can now be found in the care plan section) Acute Rehab PT Goals Patient Stated Goal: None stated. Wife reported to sit in the chair Progress towards PT goals: Progressing toward goals    Frequency    Min 2X/week      PT Plan      Co-evaluation              AM-PAC PT "6 Clicks" Mobility   Outcome Measure  Help needed turning from your back to your side while in a flat bed without using bedrails?: A Lot Help needed moving from lying on your back to sitting on the side of a flat bed without using bedrails?: A Lot Help needed moving to and from a bed to a chair (including a wheelchair)?: Total Help needed standing up from a chair using your arms (e.g., wheelchair or bedside chair)?: Total Help needed to walk in hospital room?: Total Help needed climbing 3-5 steps with a railing? : Total 6 Click Score: 8    End of Session Equipment Utilized During  Treatment: Gait belt Activity Tolerance: Patient tolerated treatment well;Patient limited by fatigue Patient left: in chair;with call bell/phone within reach;with chair alarm set;with family/visitor present Nurse Communication: Mobility status;Other (comment) (Redness in pericare area) PT Visit Diagnosis: Other abnormalities of gait and mobility (R26.89);Unsteadiness on feet (R26.81);Muscle weakness (generalized) (M62.81)     Time: 2725-3664 PT Time Calculation (min) (ACUTE ONLY): 35 min  Charges:    $Therapeutic Activity: 23-37 mins PT General Charges $$ ACUTE PT VISIT: 1 Visit                     Glenford Lanes, PT, DPT Acute Rehabilitation Services Office: [phone] Secure Chat Preferred  Riva Chester 08/08/2023, 3:29 PM

## 2023-08-08 NOTE — Assessment & Plan Note (Signed)
 08-08-2023 Also discussed code status. Wife does not want pt to have CPR or be intubated for cardiac or respiratory arrest. Code status changed to DNR/DNI.  08-09-2023 pt remains DNR/DNI.

## 2023-08-08 NOTE — Inpatient Diabetes Management (Addendum)
 Inpatient Diabetes Program Recommendations  AACE/ADA: New Consensus Statement on Inpatient Glycemic Control (2015)  Target Ranges:  Prepandial:   less than 140 mg/dL      Peak postprandial:   less than 180 mg/dL (1-2 hours)      Critically ill patients:  140 - 180 mg/dL    Latest Reference Range & Units 08/07/23 00:03 08/07/23 04:27 08/07/23 10:41 08/07/23 15:41 08/07/23 20:09  Glucose-Capillary 70 - 99 mg/dL 782 (H) 956 (H) 213 (H)  3 units Novolog   10 units Semglee   305 (H)  7 units Novolog   142 (H)  1 unit Novolog    (H): Data is abnormally high  Latest Reference Range & Units 08/07/23 23:55 08/08/23 04:03  Glucose-Capillary 70 - 99 mg/dL 086 (H) 578 (H)  (H): Data is abnormally high   Home DM Meds: Ozempic 1 mg Qweek       Metformin  1000 mg BID  Current Orders: Semglee  10 units Daily     Novolog  0-9 units QID   MD- Note pt getting Tube Feeds 50cc/hr.  CBGs elevated.  If pt does not pass Swallow study today and tube feeds are continued, please consider changing the Novolog  SSi to Q4 hour coverage (currently ordered QID)  (If within goals of care)     --Will follow patient during hospitalization--  Langston Pippins RN, MSN, CDCES Diabetes Coordinator Inpatient Glycemic Control Team Team Pager: [phone] (8a-5p)

## 2023-08-08 NOTE — Transportation (Signed)
 Leaving room for barium swallow per transport, with monitor and O2.

## 2023-08-08 NOTE — Evaluation (Signed)
 Modified Barium Swallow Study  Patient Details  Name: Bruce Franklin MRN: [id number] Date of Birth: 1945-09-18  Today's Date: 08/08/2023  Modified Barium Swallow completed.  Full report located under Chart Review in the Imaging Section.  History of Present Illness Bruce Franklin is a 78 y.o. male with medical history significant for hypertension, hyperlipidemia, CAD, chronic HFmrEF, COPD, and diabetes mellitus who presents with productive cough, fatigue, and confusion. Dx with sepsis due to PNA. REcent admission 08/02/23 for PNA. Note from 4/25 am reports "Patient saturations 87-88% on 1LNC. RN increased patient's oxygen to [facility]. RN changed patient's gown and patient mentioned to RN "I have something in my throat". RN set up suction and suctioned oral cavity. A piece of sausage and egg was removed. RN made patient and MD aware of findings. Patient is NPO pending speech eval."   Clinical Impression Pt shows minimal change in function since initial MBS 4/25. He is still aspirating all liquids secondary to timing issues as well as limited airway protective mechanisms and reduced efficiency for pharyngeal clearance. Aspiration occurs before the swallow with thinner liquids (thin, nectar thick, sometimes honey thick) and on residue from honey thick liquids. He did not aspirate purees today but does have a fairly full valleculae after the swallow and penetrates a little (PAS 3, above the vocal folds but not ejected). He is limited in what strategies he can use given current mentation. Any strategies attempted that involved pt completion, he was not able to follow commands to implement (such as a chin tuck). Attempted strategies that SLP could modify, including varying bolus size, delivery method, consistencies, and positioning in chair. A partialy reclined posture helped with purees to significantly reduce residue by counteracting against the anterior curvature of his spine, allowing boluses to be better  contained above the valleculae and giving the epilottis better inversion. Unfortunately this was not effective with honey thick liquids though, and aspiration still occurred. Discussed findings with MD as it seems like there is not a clear decision yet from family about how they would like to proceed. I would recommend f/u with SLP but they have been declining f/u services so far. If they prefer to accept aspiration risk and begin PO diet, would start with purees. Could do this with pudding thick liquids, but if truly comfort focused could offer thin liquids knowing that aspiration will occur. Will defer starting any diet at this time though as GOC are not clearly outline. Recommend considering palliative care consult.  Factors that may increase risk of adverse event in presence of aspiration Roderick Civatte & Jessy Morocco 2021): Poor general health and/or compromised immunity;Respiratory or GI disease;Reduced cognitive function;Frail or deconditioned;Weak cough;Aspiration of thick, dense, and/or acidic materials;Frequent aspiration of large volumes  Swallow Evaluation Recommendations Recommendations: NPO;Alternative means of nutrition - NG Tube Medication Administration: Via alternative means Oral care recommendations: Oral care QID (4x/day) Recommended consults: Consider Palliative care Caregiver Recommendations: Have oral suction available      Beth Brooke., M.A. CCC-SLP Acute Rehabilitation Services Office: [phone]  Secure chat preferred  08/08/2023,10:25 AM

## 2023-08-08 NOTE — TOC Progression Note (Addendum)
 Transition of Care [facility]) - Progression Note    Patient Details  Name: Bruce Franklin MRN: [id number] Date of Birth: December 27, 1945  Transition of Care [facility]) CM/SW Contact  Arron Big, [place] Phone Number: 08/08/2023, 2:15 PM  Clinical Narrative:   Per PT, patients wife is now agreeable to SNF. Patient has a cortrak at this time. Awaiting Speech to evaluate and place recommendations. Will need cortrak removed before completing SNF workup as SNFs do not accept cortraks. CSW notified treatment team.   4:02 PM Per MD, pts cortak will be removed tomorrow 4/29 and pt will have a G Tube. CSW met with patients wife at bedside and she would like patient to go to SNF. She does not have a preference but asked CSW to send referrals closer to their home.   TOC will continue to follow.      Expected Discharge Plan: Home/Self Care Barriers to Discharge: Continued Medical Work up  Expected Discharge Plan and Services In-house Referral: Clinical Social Work     Living arrangements for the past 2 months: Single Family Home                                       Social Determinants of Health (SDOH) Interventions SDOH Screenings   Food Insecurity: No Food Insecurity (08/04/2023)  Housing: High Risk (08/04/2023)  Transportation Needs: Unmet Transportation Needs (08/04/2023)  Utilities: At Risk (08/04/2023)  Social Connections: Socially Isolated (08/04/2023)  Tobacco Use: High Risk (08/03/2023)    Readmission Risk Interventions     No data to display

## 2023-08-08 NOTE — Progress Notes (Signed)
 PROGRESS NOTE    Bruce Franklin  WJX:[id number] DOB: [birth year] DOA: 08/02/2023 PCP: [facility]  Subjective: Pt seen and examined.  Today's MBS reviewed. Discussed with ST. Discussed results with pt's wife Bruce Franklin. She is aware that it is absolutely unsafe for pt to take anything by mouth. He is at high risk for aspiration and death due to aspiration pneumonia.   After much discussion, wife is agreeable to PEG tube to allow for enteral nutrition at home. She is still refusing to allow pt to go to SNF.   Will stop lovenox . Get PEG tomorrow. Wife will need education on bolus tube feeds. Plan for DC to home on Thursday.   Also discussed code status. Wife does not want pt to have CPR or be intubated for cardiac or respiratory arrest. Code status changed to DNR/DNI.  After further discussion, pt's wife is concerned about the family finances. She continues to refuse for patient to go to SNF. But she would allow home health therapy to see patient. Will order home health PT/OT/ST.   Hospital Course: HPI: Bruce Franklin is a 78 y.o. male with medical history significant for hypertension, hyperlipidemia, CAD, chronic HFmrEF, COPD, and diabetes mellitus who presents with productive cough, fatigue, and confusion.   Patient's wife has become concerned due to the patient's worsening fatigue, confusion, and productive cough.  Symptoms have been worsening over the course of several days.  No fevers have been noted at home but his blood sugar has been higher than usual.     ED Course: Upon arrival to the ED, patient is found to be afebrile and saturating 90% on room air with tachypnea, tachycardia, and stable BP.  Labs are most notable for glucosuria 33, WBC 15,700, lactic acid 2.1, and negative respiratory virus panel.  Chest x-ray is concerning for pneumonia.   Blood cultures were collected and the patient was given a liter of saline, Rocephin , and azithromycin .  Significant  Events: Admitted 08/02/2023 for pneumonia   Significant Labs: WBC 15.7, HgB 16.5, plt 214 Lactic acid 2.1 Na 134, k 4.4, CO2 of 21, BUN 32, Scr 0.97, glu 333 Covid/flu/RSV negative   Significant Imaging Studies: CXR Emphysema. Mild ground-glass infiltrate in the left lung base, probable pneumonia. Imaging follow-up to resolution is recommended  Antibiotic Therapy: Anti-infectives (From admission, onward)    Start     Dose/Rate Route Frequency Ordered Stop   08/03/23 1800  azithromycin  (ZITHROMAX ) tablet 500 mg        500 mg Oral Daily 08/03/23 0349 08/07/23 0959   08/03/23 1000  cefTRIAXone  (ROCEPHIN ) 2 g in sodium chloride  0.9 % 100 mL IVPB        2 g 200 mL/hr over 30 Minutes Intravenous Every 24 hours 08/03/23 0349 08/07/23 0959   08/03/23 0015  cefTRIAXone  (ROCEPHIN ) 1 g in sodium chloride  0.9 % 100 mL IVPB        1 g 200 mL/hr over 30 Minutes Intravenous  Once 08/03/23 0005 08/03/23 0123   08/03/23 0015  azithromycin  (ZITHROMAX ) 500 mg in sodium chloride  0.9 % 250 mL IVPB        500 mg 250 mL/hr over 60 Minutes Intravenous  Once 08/03/23 0005 08/03/23 0156       Procedures: 08-05-2023 MBS - failed. Aspirated all consistencies 08-05-2023 Cortrak placed. Tube feeds started 08-08-2023 repeat MBS - failed. Still aspirating all liquids.  Consultants: cardiology    Assessment and Plan: * Sepsis due to pneumonia (HCC) 08-03-2023 on RA. Sepsis  presents on admission due to HR 126, WBC 15.7, lactic acid of 2.1. on IV Rocephin , po zithromax .  Repeat labs in AM. Possible DC to home in AM. PT consult.  08-04-2023 procal slightly elevated at 0.23.  on RA. Continue with IV rocephin /zithromax . He is not wheezing. No role of steroids.  08-04-2023 stable. Change to po duricef and zithromax . On RA. From Pneumonia standpoint, pt is stable for DC  08-05-2023 due to dysphagia/aspiration yesterday on MBS. Back on IV abx.  08-07-2023 continue with IV abx due to NPO status. Pneumonia  appears to be improved. On RA. No fevers.  08-08-2023 continue with IV ABX due to NPO status. After IR placed G-tube tomorrow, abx can be changed to g-tube.  Oropharyngeal dysphagia 08-06-2023 failed MBS yesterday. Has cortrak for TF. Will have ST re-eval him on Monday to see if 72 hours of enteral nutrition make improvement in his swallowing function.  08-07-2023 pt seems to have improved since Friday. Continue TF today. Repeat MBS tomorrow to assess for improvement. Hopefully he can swallow safely tomorrow.  08-08-2023 failed his MBS again today.  Today's MBS reviewed. Discussed with ST. Discussed results with pt's wife Bruce Franklin. She is aware that it is absolutely unsafe for pt to take anything by mouth. He is at high risk for aspiration and death due to aspiration pneumonia.   After much discussion, wife is agreeable to PEG tube to allow for enteral nutrition at home. She is still refusing to allow pt to go to SNF. Will stop lovenox . Get PEG tomorrow. Wife will need education on bolus tube feeds. Plan for DC to home on Thursday.  Rapid atrial fibrillation (HCC) 08-04-2023 went into rapid afib yesterday. Started on IV cardizem . Echo pending. On IV heparin . Will ask cardiology in utility of DCCV vs chemical cardioversion given his prior hx of ischemic cardiomyopathy. Echo in 2021 showed partially recovered LVEF of 40%.  08-05-2023 echo shows 50-55%. Cards changed pt over to po eliquis  and Toprol -XL 50 mg daily. Awaiting DC clearance from cardiology.  08-06-2023 back in NSR. On IV lopressor  due to dysphagia. On SQ lovenox  due to dysphagia.  08-07-2023 in NSR. On IV lopressor  due to NPO from dysphagia. On SQ lovenox  due to inability to swallow Eliquis .  08-08-2023 remains on IV lopressor  due to NPO from dysphagia. Will hold lovenox  to allow for g-tube placement tomorrow. Then can change to eliquid BID via g-tube.  DNR (do not resuscitate)/DNI(Do Not Intubate) 08-08-2023 Also discussed code  status. Wife does not want pt to have CPR or be intubated for cardiac or respiratory arrest. Code status changed to DNR/DNI.  Chronic prescription opiate use 08-03-2023 goes to [facility] for opiate Rx. Continue prn oxycodone  10 mg q6h prn.  08-05-2023 stable. Due to excessive somnolence, will stop oxycodone  and xanax  for now.  08-06-2023 continue to hold opiates due to excessive somnolence. He is more awake now that he has been off oxycodone .  08-07-2023 continue to hold oxycodone . He does not appear to be having any withdrawal symptoms. Pt is more awake now that he is off oxycodone .  08-08-2023 continue to hold oxycodone . Has prn morphine . Do not give unless absolutely necessary.  Chronic heart failure with mildly reduced ejection fraction (HFmrEF, 41-49%) (HCC) 08-03-2023 not on GDMT as outpatient.   08-05-2023 stable. LVEF 50-55%. Pt will need f/u with cardiology as outpatient.  08-06-2023 stable.  08-07-2023 stable.  08-08-2023 stable.  CAD (coronary artery disease) 08-03-2023 stable. Cont asa.  08-05-2023 stable  08-06-2023 stable.  08-07-2023 stable.  08-08-2023 stable.  COPD (chronic obstructive pulmonary disease) (HCC) 08-03-2023 continue prn duonebs. No need for steroids. Not exacerbated.  08-05-2023 stable.  08-06-2023 stable.  08-07-2023 stable. Not exacerbated. No need for steroids.  08-08-2023 stable.  Type 2 diabetes mellitus without complication (HCC) 08-03-2023 continue SSI. Change to Ac/hs. Add lantus  5 units daily.  Lab Results  Component Value Date   HGBA1C 9.4 (H) 08/03/2023   08-05-2023 CBG adequate control. Continue with lantus  and SSI.  08-06-2023 now that he is on tube feeds. Will increase lantus  to 10 units daily. On SSI QID  08-07-2023 continue with lantus  and SSI. Will need to hold his TF after MN for his g-tube by IR tomorrow.  08-07-2023 lantus  increased to 10 units daily yesterday. Continue with SSI.  Pressure  injury of skin Pressure Injury 08/07/23 Sacrum Medial Stage 1 -  Intact skin with non-blanchable redness of a localized area usually over a bony prominence. (Active)  08/07/23 1238  Location: Sacrum  Location Orientation: Medial  Staging: Stage 1 -  Intact skin with non-blanchable redness of a localized area usually over a bony prominence.  Wound Description (Comments):   Present on Admission:       DVT prophylaxis:   Lovenox    Code Status: Limited: Do not attempt resuscitation (DNR) -DNR-LIMITED -Do Not Intubate/DNI  Family Communication: talked with wife and son separately by phone today Disposition Plan: return home. Wife refuses for pt to be placed into SNF Reason for continuing need for hospitalization: getting g-tube placed tomorrow. Remains on Cortrak feeding today.  Objective: Vitals:   08/08/23 0540 08/08/23 0606 08/08/23 0745 08/08/23 0858  BP: (!) 143/102 (!) 158/84 (!) 162/96   Pulse:  87 87 94  Resp: (!) 22 20 20 20   Temp:  98.5 F (36.9 C) 97.7 F (36.5 C)   TempSrc:  Oral Oral   SpO2:  91% 91% 90%  Weight:  65.3 kg    Height:        Intake/Output Summary (Last 24 hours) at 08/08/2023 1112 Last data filed at 08/07/2023 2358 Gross per 24 hour  Intake --  Output 450 ml  Net -450 ml   Filed Weights   08/06/23 0522 08/07/23 0546 08/08/23 0606  Weight: 63.4 kg 64 kg 65.3 kg    Examination:  Physical Exam Vitals and nursing note reviewed.  Constitutional:      Comments: Awake. confused  HENT:     Head: Normocephalic and atraumatic.  Eyes:     General: No scleral icterus. Cardiovascular:     Rate and Rhythm: Regular rhythm. Tachycardia present.  Pulmonary:     Effort: Pulmonary effort is normal.     Breath sounds: Normal breath sounds.  Abdominal:     General: Bowel sounds are normal.     Palpations: Abdomen is soft.  Skin:    General: Skin is warm and dry.     Capillary Refill: Capillary refill takes less than 2 seconds.  Neurological:      Mental Status: He is disoriented.     Data Reviewed: I have personally reviewed following labs and imaging studies  CBC: Recent Labs  Lab 08/02/23 2254 08/03/23 0423 08/04/23 0337 08/05/23 0402 08/06/23 0933  WBC 15.7* 11.9* 10.4 12.6* 9.3  NEUTROABS 14.2*  --  7.8* 9.8* 7.3  HGB 16.5 14.5 16.2 16.2 16.3  HCT 47.9 43.6 46.9 47.2 48.3  MCV 90.7 91.6 91.6 90.4 89.8  PLT 214 204 213 235 257  Basic Metabolic Panel: Recent Labs  Lab 08/03/23 0423 08/04/23 0337 08/05/23 0402 08/05/23 1717 08/06/23 0933 08/07/23 0304 08/08/23 0251  NA 138 138 135  --  137 137  --   K 4.0 3.2* 4.0  --  3.7 3.9  --   CL 103 102 102  --  105 104  --   CO2 22 23 21*  --  20* 20*  --   GLUCOSE 294* 231* 178*  --  207* 252*  --   BUN 13 9 9   --  13 17  --   CREATININE 0.95 0.72 0.77  --  0.75 0.67  --   CALCIUM  9.0 8.9 8.8*  --  9.5 9.6  --   MG  --  1.5* 2.0  --  1.9 1.9 2.0  PHOS  --   --   --  3.6 3.6 3.9 4.2   GFR: Estimated Creatinine Clearance: 71.4 mL/min (by C-G formula based on SCr of 0.67 mg/dL). Liver Function Tests: Recent Labs  Lab 08/02/23 2254  AST 17  ALT 15  ALKPHOS 41  BILITOT 1.2  PROT 7.3  ALBUMIN 3.7   No results for input(s): "LIPASE", "AMYLASE" in the last 168 hours. No results for input(s): "AMMONIA" in the last 168 hours. Coagulation Profile: No results for input(s): "INR", "PROTIME" in the last 168 hours. Cardiac Enzymes: No results for input(s): "CKTOTAL", "CKMB", "CKMBINDEX", "TROPONINI" in the last 168 hours. BNP (last 3 results) No results for input(s): "BNP" in the last 8760 hours. HbA1C: No results for input(s): "HGBA1C" in the last 72 hours. CBG: Recent Labs  Lab 08/07/23 1541 08/07/23 2009 08/07/23 2355 08/08/23 0403 08/08/23 0758  GLUCAP 305* 142* 220* 199* 201*   Lipid Profile: No results for input(s): "CHOL", "HDL", "LDLCALC", "TRIG", "CHOLHDL", "LDLDIRECT" in the last 72 hours. Thyroid Function Tests: No results for input(s):  "TSH", "T4TOTAL", "FREET4", "T3FREE", "THYROIDAB" in the last 72 hours. Anemia Panel: No results for input(s): "VITAMINB12", "FOLATE", "FERRITIN", "TIBC", "IRON", "RETICCTPCT" in the last 72 hours. Sepsis Labs: Recent Labs  Lab 08/02/23 2254 08/03/23 0040 08/03/23 0820 08/04/23 0337  PROCALCITON  --   --   --  0.23  LATICACIDVEN 2.1* 3.0* 1.5  --     Recent Results (from the past 240 hours)  Resp panel by RT-PCR (RSV, Flu A&B, Covid) Anterior Nasal Swab     Status: None   Collection Time: 08/02/23 10:40 PM   Specimen: Anterior Nasal Swab  Result Value Ref Range Status   SARS Coronavirus 2 by RT PCR NEGATIVE NEGATIVE Final   Influenza A by PCR NEGATIVE NEGATIVE Final   Influenza B by PCR NEGATIVE NEGATIVE Final    Comment: (NOTE) The Xpert Xpress SARS-CoV-2/FLU/RSV plus assay is intended as an aid in the diagnosis of influenza from Nasopharyngeal swab specimens and should not be used as a sole basis for treatment. Nasal washings and aspirates are unacceptable for Xpert Xpress SARS-CoV-2/FLU/RSV testing.  Fact Sheet for Patients: BloggerCourse.com  Fact Sheet for Healthcare Providers: SeriousBroker.it  This test is not yet approved or cleared by the [place]  FDA and has been authorized for detection and/or diagnosis of SARS-CoV-2 by FDA under an Emergency Use Authorization (EUA). This EUA will remain in effect (meaning this test can be used) for the duration of the COVID-19 declaration under Section 564(b)(1) of the Act, 21 U.S.C. section 360bbb-3(b)(1), unless the authorization is terminated or revoked.     Resp Syncytial Virus by PCR NEGATIVE NEGATIVE Final  Comment: (NOTE) Fact Sheet for Patients: BloggerCourse.com  Fact Sheet for Healthcare Providers: SeriousBroker.it  This test is not yet approved or cleared by the [place]  FDA and has been authorized  for detection and/or diagnosis of SARS-CoV-2 by FDA under an Emergency Use Authorization (EUA). This EUA will remain in effect (meaning this test can be used) for the duration of the COVID-19 declaration under Section 564(b)(1) of the Act, 21 U.S.C. section 360bbb-3(b)(1), unless the authorization is terminated or revoked.  Performed at [facility], [address], [place], [place] [address]   Blood culture (routine x 2)     Status: None   Collection Time: 08/02/23 11:36 PM   Specimen: BLOOD  Result Value Ref Range Status   Specimen Description BLOOD SITE NOT SPECIFIED  Final   Special Requests   Final    BOTTLES DRAWN AEROBIC AND ANAEROBIC Blood Culture adequate volume   Culture   Final    NO GROWTH 5 DAYS Performed at [facility], [address], [place], [place] [address]    Report Status 08/08/2023 FINAL  Final  Blood culture (routine x 2)     Status: None   Collection Time: 08/02/23 11:41 PM   Specimen: BLOOD  Result Value Ref Range Status   Specimen Description BLOOD BLOOD RIGHT FOREARM  Final   Special Requests   Final    BOTTLES DRAWN AEROBIC AND ANAEROBIC Blood Culture adequate volume   Culture   Final    NO GROWTH 5 DAYS Performed at [facility], [address] [place], [place] [address]    Report Status 08/08/2023 FINAL  Final     Radiology Studies: DG Swallowing Func-Speech Pathology Result Date: 08/08/2023 Table formatting from the original result was not included. Modified Barium Swallow Study Patient Details Name: Bruce Franklin MRN: [id number] Date of Birth: [birth year] Today's Date: 08/08/2023 HPI/PMH: HPI: HILMON GRADNEY is a 78 y.o. male with medical history significant for hypertension, hyperlipidemia, CAD, chronic HFmrEF, COPD, and diabetes mellitus who presents with productive cough, fatigue, and confusion. Dx with sepsis due to PNA. REcent admission 08/02/23 for PNA. Note from 4/25 am reports "Patient saturations 87-88% on 1LNC. RN  increased patient's oxygen to [facility]. RN changed patient's gown and patient mentioned to RN "I have something in my throat". RN set up suction and suctioned oral cavity. A piece of sausage and egg was removed. RN made patient and MD aware of findings. Patient is NPO pending speech eval." Clinical Impression: Clinical Impression: Pt shows minimal change in function since initial MBS 4/25. He is still aspirating all liquids secondary to timing issues as well as limited airway protective mechanisms and reduced efficiency for pharyngeal clearance. Aspiration occurs before the swallow with thinner liquids (thin, nectar thick, sometimes honey thick) and on residue from honey thick liquids. He did not aspirate purees today but does have a fairly full valleculae after the swallow and penetrates a little (PAS 3, above the vocal folds but not ejected). He is limited in what strategies he can use given current mentation. Any strategies attempted that involved pt completion, he was not able to follow commands to implement (such as a chin tuck). Attempted strategies that SLP could modify, including varying bolus size, delivery method, consistencies, and positioning in chair. A partialy reclined posture helped with purees to significantly reduce residue by counteracting against the anterior curvature of his spine, allowing boluses to be better contained above the valleculae and giving the epilottis  better inversion. Unfortunately this was not effective with honey thick liquids though, and aspiration still occurred. Discussed findings with MD as it seems like there is not a clear decision yet from family about how they would like to proceed. I would recommend f/u with SLP but they have been declining f/u services so far. If they prefer to accept aspiration risk and begin PO diet, would start with purees. Could do this with pudding thick liquids, but if truly comfort focused could offer thin liquids knowing that aspiration will  occur. Will defer starting any diet at this time though as GOC are not clearly outline. Recommend considering palliative care consult. Factors that may increase risk of adverse event in presence of aspiration Roderick Civatte & Jessy Morocco 2021): Factors that may increase risk of adverse event in presence of aspiration Roderick Civatte & Jessy Morocco 2021): Poor general health and/or compromised immunity; Respiratory or GI disease; Reduced cognitive function; Frail or deconditioned; Weak cough; Aspiration of thick, dense, and/or acidic materials; Frequent aspiration of large volumes Recommendations/Plan: Swallowing Evaluation Recommendations Swallowing Evaluation Recommendations Recommendations: NPO; Alternative means of nutrition - NG Tube Medication Administration: Via alternative means Oral care recommendations: Oral care QID (4x/day) Recommended consults: Consider Palliative care Caregiver Recommendations: Have oral suction available Treatment Plan Treatment Plan Treatment recommendations: Therapy as outlined in treatment plan below Follow-up recommendations: Skilled nursing-short term rehab (<3 hours/day) Functional status assessment: Patient has had a recent decline in their functional status and demonstrates the ability to make significant improvements in function in a reasonable and predictable amount of time. Treatment frequency: Min 2x/week Treatment duration: 2 weeks Interventions: Aspiration precaution training; Compensatory techniques; Patient/family education; Trials of upgraded texture/liquids Recommendations Recommendations for follow up therapy are one component of a multi-disciplinary discharge planning process, led by the attending physician.  Recommendations may be updated based on patient status, additional functional criteria and insurance authorization. Assessment: Orofacial Exam: Orofacial Exam Oral Cavity - Dentition: Edentulous Anatomy: Anatomy: Other (Comment) (anterior curvature of spine) Boluses Administered:  Boluses Administered Boluses Administered: Thin liquids (Level 0); Mildly thick liquids (Level 2, nectar thick); Moderately thick liquids (Level 3, honey thick); Puree  Oral Impairment Domain: Oral Impairment Domain Lip Closure: Escape beyond mid-chin Tongue control during bolus hold: Escape to lateral buccal cavity/floor of mouth Bolus preparation/mastication: -- (deferred) Bolus transport/lingual motion: Repetitive/disorganized tongue motion Oral residue: Residue collection on oral structures Location of oral residue : Floor of mouth; Tongue Initiation of pharyngeal swallow : Pyriform sinuses  Pharyngeal Impairment Domain: Pharyngeal Impairment Domain Soft palate elevation: No bolus between soft palate (SP)/pharyngeal wall (PW) Laryngeal elevation: Partial superior movement of thyroid cartilage/partial approximation of arytenoids to epiglottic petiole Anterior hyoid excursion: Complete anterior movement Epiglottic movement: Partial inversion Laryngeal vestibule closure: Incomplete, narrow column air/contrast in laryngeal vestibule Pharyngeal stripping wave : Present - diminished Pharyngeal contraction (A/P view only): N/A Pharyngoesophageal segment opening: Complete distension and complete duration, no obstruction of flow Tongue base retraction: Trace column of contrast or air between tongue base and PPW Pharyngeal residue: Collection of residue within or on pharyngeal structures Location of pharyngeal residue: Valleculae; Aryepiglottic folds; Pyriform sinuses  Esophageal Impairment Domain: No data recorded Pill: Pill Consistency administered: -- (deferred) Penetration/Aspiration Scale Score: Penetration/Aspiration Scale Score 2.  Material enters airway, remains ABOVE vocal cords then ejected out: Puree 7.  Material enters airway, passes BELOW cords and not ejected out despite cough attempt by patient: Thin liquids (Level 0); Mildly thick liquids (Level 2, nectar thick); Moderately thick liquids (Level 3, honey  thick) 8.  Material enters airway, passes BELOW cords without attempt by patient to eject out (silent aspiration) : Thin liquids (Level 0); Mildly thick liquids (Level 2, nectar thick); Moderately thick liquids (Level 3, honey thick) Compensatory Strategies: Compensatory Strategies Compensatory strategies: Yes Straw: Ineffective Ineffective Straw: Moderately thick liquid (Level 3, honey thick) (attempted to use to facilitate chin tuck but pt could not get liquid up via straw) Chin tuck: Ineffective Ineffective Chin Tuck: Moderately thick liquid (Level 3, honey thick) (could not even perform strategy) Reclining posture: Effective; Ineffective Effective Reclining Posture: Puree Ineffective Reclining Posture: Moderately thick liquid (Level 3, honey thick)   General Information: Caregiver present: No  Diet Prior to this Study: NPO; Cortrak/Small bore NG tube   Temperature : Normal   Respiratory Status: WFL   Supplemental O2: Nasal cannula   History of Recent Intubation: No  Behavior/Cognition: Alert; Confused; Requires cueing Self-Feeding Abilities: Needs assist with self-feeding Baseline vocal quality/speech: Hypophonia/low volume Volitional Cough: Able to elicit Volitional Swallow: Able to elicit (inconsistently) Exam Limitations: No limitations Goal Planning: Prognosis for improved oropharyngeal function: Fair Barriers to Reach Goals: Cognitive deficits; Severity of deficits; Other (Comment) (family declining any f/u therapy) No data recorded Patient/Family Stated Goal: none stated Consulted and agree with results and recommendations: Patient; Physician Pain: Pain Assessment Pain Assessment: Faces Faces Pain Scale: 0 End of Session: Start Time:SLP Start Time (ACUTE ONLY): 0913 Stop Time: SLP Stop Time (ACUTE ONLY): 0927 Time Calculation:SLP Time Calculation (min) (ACUTE ONLY): 14 min Charges: SLP Evaluations $ SLP Speech Visit: 1 Visit SLP Evaluations $MBS Swallow: 1 Procedure SLP visit diagnosis: SLP Visit  Diagnosis: Dysphagia, oropharyngeal phase (R13.12) Past Medical History: Past Medical History: Diagnosis Date  Acute biliary pancreatitis   Anxiety   Cholelithiasis   COPD (chronic obstructive pulmonary disease) (HCC)   Diabetes mellitus without complication (HCC)   Pneumonia   Prostate cancer (HCC)   Smokes 1 pack of cigarettes per day  Past Surgical History: Past Surgical History: Procedure Laterality Date  CHOLECYSTECTOMY N/A 04/01/2016  Procedure: LAPAROSCOPIC CHOLECYSTECTOMY;  Surgeon: Adalberto Acton, MD;  Location: [facility] OR;  Service: General;  Laterality: N/A;  ERCP N/A 03/30/2016  Procedure: ENDOSCOPIC RETROGRADE CHOLANGIOPANCREATOGRAPHY (ERCP);  Surgeon: Janel Medford, MD;  Location: [facility] ENDOSCOPY;  Service: Endoscopy;  Laterality: N/A;  LUMBAR FUSION    PROSTATE SURGERY    ROTATOR CUFF REPAIR   Beth Brooke., M.A. CCC-SLP Acute Rehabilitation Services Office: [phone] Secure chat preferred 08/08/2023, 10:27 AM   Scheduled Meds:  feeding supplement (PROSource TF20)  60 mL Per Tube Daily   free water  100 mL Per Tube Q6H   Gerhardt's butt cream   Topical BID   insulin  aspart  0-9 Units Subcutaneous QID   insulin  glargine-yfgn  10 Units Subcutaneous Daily   metoprolol  tartrate  5 mg Intravenous Q6H   multivitamin with minerals  1 tablet Per Tube Daily   sodium chloride  flush  3 mL Intravenous Q12H   thiamine   100 mg Per Tube Daily   Continuous Infusions:  azithromycin  500 mg (08/07/23 1028)   cefTRIAXone  (ROCEPHIN )  IV 1 g (08/08/23 0822)   feeding supplement (OSMOLITE 1.5 CAL) 1,000 mL (08/06/23 2129)     LOS: 5 days   Time spent: 50 minutes  Unk Garb, DO  [facility]  08/08/2023, 11:12 AM

## 2023-08-09 DIAGNOSIS — I4891 Unspecified atrial fibrillation: Secondary | ICD-10-CM | POA: Diagnosis not present

## 2023-08-09 DIAGNOSIS — R1312 Dysphagia, oropharyngeal phase: Secondary | ICD-10-CM | POA: Diagnosis not present

## 2023-08-09 DIAGNOSIS — J189 Pneumonia, unspecified organism: Secondary | ICD-10-CM | POA: Diagnosis not present

## 2023-08-09 DIAGNOSIS — I5022 Chronic systolic (congestive) heart failure: Secondary | ICD-10-CM | POA: Diagnosis not present

## 2023-08-09 LAB — CBC
HCT: 45.5 % (ref 39.0–52.0)
Hemoglobin: 15.6 g/dL (ref 13.0–17.0)
MCH: 31 pg (ref 26.0–34.0)
MCHC: 34.3 g/dL (ref 30.0–36.0)
MCV: 90.5 fL (ref 80.0–100.0)
Platelets: 268 10*3/uL (ref 150–400)
RBC: 5.03 MIL/uL (ref 4.22–5.81)
RDW: 13.2 % (ref 11.5–15.5)
WBC: 11.2 10*3/uL — ABNORMAL HIGH (ref 4.0–10.5)
nRBC: 0 % (ref 0.0–0.2)

## 2023-08-09 LAB — GLUCOSE, CAPILLARY
Glucose-Capillary: 172 mg/dL — ABNORMAL HIGH (ref 70–99)
Glucose-Capillary: 193 mg/dL — ABNORMAL HIGH (ref 70–99)
Glucose-Capillary: 211 mg/dL — ABNORMAL HIGH (ref 70–99)
Glucose-Capillary: 203 mg/dL — ABNORMAL HIGH (ref 70–99)
Glucose-Capillary: 184 mg/dL — ABNORMAL HIGH (ref 70–99)
Glucose-Capillary: 207 mg/dL — ABNORMAL HIGH (ref 70–99)
Glucose-Capillary: 198 mg/dL — ABNORMAL HIGH (ref 70–99)

## 2023-08-09 LAB — MAGNESIUM: Magnesium: 2 mg/dL (ref 1.7–2.4)

## 2023-08-09 MED ORDER — IOHEXOL 300 MG/ML  SOLN
75.0000 mL | Freq: Once | INTRAMUSCULAR | Status: AC
  Administered 2023-08-09: 75 mL

## 2023-08-09 MED ORDER — OSMOLITE 1.5 CAL PO LIQD
1000.0000 mL | ORAL | Status: DC
  Administered 2023-08-09: 1000 mL
  Filled 2023-08-09: qty 1000

## 2023-08-09 MED ORDER — INSULIN ASPART 100 UNIT/ML IJ SOLN
0.0000 [IU] | Freq: Four times a day (QID) | INTRAMUSCULAR | Status: DC
  Administered 2023-08-09: 3 [IU] via SUBCUTANEOUS
  Administered 2023-08-10 (×3): 2 [IU] via SUBCUTANEOUS
  Administered 2023-08-10: 3 [IU] via SUBCUTANEOUS
  Administered 2023-08-11: 1 [IU] via SUBCUTANEOUS
  Administered 2023-08-11: 3 [IU] via SUBCUTANEOUS
  Administered 2023-08-11: 2 [IU] via SUBCUTANEOUS
  Administered 2023-08-11: 7 [IU] via SUBCUTANEOUS
  Administered 2023-08-12: 2 [IU] via SUBCUTANEOUS
  Administered 2023-08-12: 5 [IU] via SUBCUTANEOUS

## 2023-08-09 MED ORDER — DEXTROSE-SODIUM CHLORIDE 5-0.45 % IV SOLN: INTRAVENOUS | Status: AC

## 2023-08-09 MED ORDER — CEFAZOLIN SODIUM-DEXTROSE 2-4 GM/100ML-% IV SOLN: 2.0000 g | INTRAVENOUS | Status: AC

## 2023-08-09 NOTE — TOC Progression Note (Signed)
 Transition of Care [facility]) - Progression Note    Patient Details  Name: Bruce Franklin MRN: [id number] Date of Birth: 10-12-1945  Transition of Care [facility]) CM/SW Contact  Arron Big, [place] Phone Number: 08/09/2023, 1:29 PM  Clinical Narrative:   CSW provided patients spouse and sister in law medicare.gov rated bed offers for SNF. At this time, they have chosen [facility].   CSW reached out to Financial Counseling per spouses request to inquire about screening for LTC Medicaid. Per FC they will contact patients spouse to follow up.  1:52 PM CSW spoke with [facility] admissions about patient. Per MD note, patient G-Tube will not be placed until Thursday. CSW will continue to update facility.   TOC will continue to follow.    Expected Discharge Plan: Home/Self Care Barriers to Discharge: Continued Medical Work up  Expected Discharge Plan and Services In-house Referral: Clinical Social Work     Living arrangements for the past 2 months: Single Family Home                                       Social Determinants of Health (SDOH) Interventions SDOH Screenings   Food Insecurity: No Food Insecurity (08/04/2023)  Housing: High Risk (08/04/2023)  Transportation Needs: Unmet Transportation Needs (08/04/2023)  Utilities: At Risk (08/04/2023)  Social Connections: Socially Isolated (08/04/2023)  Tobacco Use: High Risk (08/03/2023)    Readmission Risk Interventions     No data to display

## 2023-08-09 NOTE — Plan of Care (Signed)
  Problem: Coping: Goal: Ability to adjust to condition or change in health will improve Outcome: Progressing   Problem: Education: Goal: Ability to describe self-care measures that may prevent or decrease complications (Diabetes Survival Skills Education) will improve Outcome: Progressing Goal: Individualized Educational Video(s) Outcome: Progressing   Problem: Health Behavior/Discharge Planning: Goal: Ability to identify and utilize available resources and services will improve Outcome: Progressing Goal: Ability to manage health-related needs will improve Outcome: Progressing

## 2023-08-09 NOTE — Progress Notes (Signed)
 PROGRESS NOTE    Bruce Franklin  ZOX:[id number] DOB: September 26, 1945 DOA: 08/02/2023 PCP: [facility]  Subjective: Pt seen and examined.  Notified by IR that g-tube will not be placed until Thursday.  Also appears that wife is now interested in placing pt into SNF.  He was made DNR yesterday by his wife after my discussions with her.   Hospital Course: HPI: Bruce Franklin is a 78 y.o. male with medical history significant for hypertension, hyperlipidemia, CAD, chronic HFmrEF, COPD, and diabetes mellitus who presents with productive cough, fatigue, and confusion.   Patient's wife has become concerned due to the patient's worsening fatigue, confusion, and productive cough.  Symptoms have been worsening over the course of several days.  No fevers have been noted at home but his blood sugar has been higher than usual.     ED Course: Upon arrival to the ED, patient is found to be afebrile and saturating 90% on room air with tachypnea, tachycardia, and stable BP.  Labs are most notable for glucosuria 33, WBC 15,700, lactic acid 2.1, and negative respiratory virus panel.  Chest x-ray is concerning for pneumonia.   Blood cultures were collected and the patient was given a liter of saline, Rocephin , and azithromycin .  Significant Events: Admitted 08/02/2023 for pneumonia   Significant Labs: WBC 15.7, HgB 16.5, plt 214 Lactic acid 2.1 Na 134, k 4.4, CO2 of 21, BUN 32, Scr 0.97, glu 333 Covid/flu/RSV negative   Significant Imaging Studies: CXR Emphysema. Mild ground-glass infiltrate in the left lung base, probable pneumonia. Imaging follow-up to resolution is recommended  Antibiotic Therapy: Anti-infectives (From admission, onward)    Start     Dose/Rate Route Frequency Ordered Stop   08/03/23 1800  azithromycin  (ZITHROMAX ) tablet 500 mg        500 mg Oral Daily 08/03/23 0349 08/07/23 0959   08/03/23 1000  cefTRIAXone  (ROCEPHIN ) 2 g in sodium chloride  0.9 % 100 mL IVPB         2 g 200 mL/hr over 30 Minutes Intravenous Every 24 hours 08/03/23 0349 08/07/23 0959   08/03/23 0015  cefTRIAXone  (ROCEPHIN ) 1 g in sodium chloride  0.9 % 100 mL IVPB        1 g 200 mL/hr over 30 Minutes Intravenous  Once 08/03/23 0005 08/03/23 0123   08/03/23 0015  azithromycin  (ZITHROMAX ) 500 mg in sodium chloride  0.9 % 250 mL IVPB        500 mg 250 mL/hr over 60 Minutes Intravenous  Once 08/03/23 0005 08/03/23 0156       Procedures: 08-05-2023 MBS - failed. Aspirated all consistencies 08-05-2023 Cortrak placed. Tube feeds started 08-08-2023 repeat MBS - failed. Still aspirating all liquids.  Consultants: cardiology    Assessment and Plan: * Sepsis due to pneumonia (HCC) 08-03-2023 on RA. Sepsis presents on admission due to HR 126, WBC 15.7, lactic acid of 2.1. on IV Rocephin , po zithromax .  Repeat labs in AM. Possible DC to home in AM. PT consult.  08-04-2023 procal slightly elevated at 0.23.  on RA. Continue with IV rocephin /zithromax . He is not wheezing. No role of steroids.  08-04-2023 stable. Change to po duricef and zithromax . On RA. From Pneumonia standpoint, pt is stable for DC  08-05-2023 due to dysphagia/aspiration yesterday on MBS. Back on IV abx.  08-07-2023 continue with IV abx due to NPO status. Pneumonia appears to be improved. On RA. No fevers.  08-08-2023 continue with IV ABX for now since his g-tube will not be placed  until tomorrow.  08-08-2023 continue with IV ABX due to NPO status. After IR placed G-tube tomorrow, abx can be changed to g-tube.  Oropharyngeal dysphagia 08-06-2023 failed MBS yesterday. Has cortrak for TF. Will have ST re-eval him on Monday to see if 72 hours of enteral nutrition make improvement in his swallowing function.  08-07-2023 pt seems to have improved since Friday. Continue TF today. Repeat MBS tomorrow to assess for improvement. Hopefully he can swallow safely tomorrow.  08-08-2023 failed his MBS again today.  Today's MBS reviewed.  Discussed with ST. Discussed results with pt's wife [name]. She is aware that it is absolutely unsafe for pt to take anything by mouth. He is at high risk for aspiration and death due to aspiration pneumonia.   After much discussion, wife is agreeable to PEG tube to allow for enteral nutrition at home. She is still refusing to allow pt to go to SNF. Will stop lovenox . Get PEG tomorrow. Wife will need education on bolus tube feeds. Plan for DC to home on Thursday.  08-09-2023 IR will place G-tube tomorrow. Wife wants pt to be placed into SNF at discharge now after refusing SNF placement.  Rapid atrial fibrillation (HCC) 08-04-2023 went into rapid afib yesterday. Started on IV cardizem . Echo pending. On IV heparin . Will ask cardiology in utility of DCCV vs chemical cardioversion given his prior hx of ischemic cardiomyopathy. Echo in 2021 showed partially recovered LVEF of 40%.  08-05-2023 echo shows 50-55%. Cards changed pt over to po eliquis  and Toprol -XL 50 mg daily. Awaiting DC clearance from cardiology.  08-06-2023 back in NSR. On IV lopressor  due to dysphagia. On SQ lovenox  due to dysphagia.  08-07-2023 in NSR. On IV lopressor  due to NPO from dysphagia. On SQ lovenox  due to inability to swallow Eliquis .  08-08-2023 remains on IV lopressor  due to NPO from dysphagia. Will hold lovenox  to allow for g-tube placement tomorrow. Then can change to eliquid BID via g-tube.  08-09-2023 remains on IV lopressor . Continue to hold systemic anticoagulation until after his g-tube is placed tomorrow. Pt remains in NSR on telemetry. Can start Eliquis  via g-tube when timing is appropriate.  DNR (do not resuscitate)/DNI(Do Not Intubate) 08-08-2023 Also discussed code status. Wife does not want pt to have CPR or be intubated for cardiac or respiratory arrest. Code status changed to DNR/DNI.  08-09-2023 pt remains DNR/DNI.  Chronic prescription opiate use 08-03-2023 goes to [facility] for opiate Rx.  Continue prn oxycodone  10 mg q6h prn.  08-05-2023 stable. Due to excessive somnolence, will stop oxycodone  and xanax  for now.  08-06-2023 continue to hold opiates due to excessive somnolence. He is more awake now that he has been off oxycodone .  08-07-2023 continue to hold oxycodone . He does not appear to be having any withdrawal symptoms. Pt is more awake now that he is off oxycodone .  08-08-2023 continue to hold oxycodone . Has prn morphine . Do not give unless absolutely necessary.  08-09-2023 do not schedule opiates. It is better for pt not be on opiates in the future.  Chronic heart failure with mildly reduced ejection fraction (HFmrEF, 41-49%) (HCC) 08-03-2023 not on GDMT as outpatient.   08-05-2023 stable. LVEF 50-55%. Pt will need f/u with cardiology as outpatient.  08-06-2023 stable.  08-07-2023 stable.  08-08-2023 stable.  08-09-2023 stable.  CAD (coronary artery disease) 08-03-2023 stable. Cont asa.  08-05-2023 stable   08-06-2023 stable.  08-07-2023 stable.  08-08-2023 stable.  08-09-2023 stable.  COPD (chronic obstructive pulmonary disease) (HCC) 08-03-2023 continue  prn duonebs. No need for steroids. Not exacerbated.  08-05-2023 stable.  08-06-2023 stable.  08-07-2023 stable. Not exacerbated. No need for steroids.  08-08-2023 stable.  08-09-2023 stable.  Type 2 diabetes mellitus without complication (HCC) 08-03-2023 continue SSI. Change to Ac/hs. Add lantus  5 units daily.  Lab Results  Component Value Date   HGBA1C 9.4 (H) 08/03/2023   08-05-2023 CBG adequate control. Continue with lantus  and SSI.  08-06-2023 now that he is on tube feeds. Will increase lantus  to 10 units daily. On SSI QID  08-07-2023 continue with lantus  and SSI. Will need to hold his TF after MN for his g-tube by IR tomorrow.  08-08-2023 lantus  increased to 10 units daily yesterday. Continue with SSI.  08-09-2023 stable. On lantus  and SSI.  Pressure injury of  skin Pressure Injury 08/07/23 Sacrum Medial Stage 1 -  Intact skin with non-blanchable redness of a localized area usually over a bony prominence. (Active)  08/07/23 1238  Location: Sacrum  Location Orientation: Medial  Staging: Stage 1 -  Intact skin with non-blanchable redness of a localized area usually over a bony prominence.  Wound Description (Comments):   Present on Admission:        DVT prophylaxis:   SCDs   Code Status: Limited: Do not attempt resuscitation (DNR) -DNR-LIMITED -Do Not Intubate/DNI  Family Communication: spoke with pt's wife yesterday Disposition Plan: SNF Reason for continuing need for hospitalization: awaiting G-tube placement. Awaiting SNF placement.  Objective: Vitals:   08/08/23 1945 08/09/23 0004 08/09/23 0400 08/09/23 0730  BP: (!) 148/76 (!) 157/78 (!) 155/96 135/85  Pulse: 90 96 100 89  Resp: 20 20 17  (!) 21  Temp: 98.2 F (36.8 C) 98.2 F (36.8 C) 99.3 F (37.4 C) 98.5 F (36.9 C)  TempSrc: Oral Oral Oral Oral  SpO2: 94% 94% 90% 90%  Weight:   63.2 kg   Height:        Intake/Output Summary (Last 24 hours) at 08/09/2023 1015 Last data filed at 08/09/2023 0403 Gross per 24 hour  Intake 1025 ml  Output 900 ml  Net 125 ml   Filed Weights   08/07/23 0546 08/08/23 0606 08/09/23 0400  Weight: 64 kg 65.3 kg 63.2 kg    Examination:  Physical Exam Vitals and nursing note reviewed.  Constitutional:      General: He is not in acute distress.    Appearance: He is not toxic-appearing.     Comments: Lethargic Chronically ill appearing  HENT:     Head: Normocephalic and atraumatic.     Nose:     Comments: +cortrak in nare Cardiovascular:     Rate and Rhythm: Normal rate and regular rhythm.  Pulmonary:     Effort: Pulmonary effort is normal. No respiratory distress.     Breath sounds: Normal breath sounds.  Abdominal:     General: Abdomen is flat. Bowel sounds are normal. There is no distension.  Skin:    General: Skin is warm and  dry.     Capillary Refill: Capillary refill takes less than 2 seconds.  Neurological:     Comments: asleep    Data Reviewed: I have personally reviewed following labs and imaging studies  CBC: Recent Labs  Lab 08/02/23 2254 08/03/23 0423 08/04/23 0337 08/05/23 0402 08/06/23 0933 08/09/23 0031  WBC 15.7* 11.9* 10.4 12.6* 9.3 11.2*  NEUTROABS 14.2*  --  7.8* 9.8* 7.3  --   HGB 16.5 14.5 16.2 16.2 16.3 15.6  HCT 47.9 43.6 46.9 47.2 48.3  45.5  MCV 90.7 91.6 91.6 90.4 89.8 90.5  PLT 214 204 213 235 257 268   Basic Metabolic Panel: Recent Labs  Lab 08/03/23 0423 08/04/23 0337 08/04/23 0337 08/05/23 0402 08/05/23 1717 08/06/23 0933 08/07/23 0304 08/08/23 0251 08/09/23 0257  NA 138 138  --  135  --  137 137  --   --   K 4.0 3.2*  --  4.0  --  3.7 3.9  --   --   CL 103 102  --  102  --  105 104  --   --   CO2 22 23  --  21*  --  20* 20*  --   --   GLUCOSE 294* 231*  --  178*  --  207* 252*  --   --   BUN 13 9  --  9  --  13 17  --   --   CREATININE 0.95 0.72  --  0.77  --  0.75 0.67  --   --   CALCIUM  9.0 8.9  --  8.8*  --  9.5 9.6  --   --   MG  --  1.5*   < > 2.0  --  1.9 1.9 2.0 2.0  PHOS  --   --   --   --  3.6 3.6 3.9 4.2  --    < > = values in this interval not displayed.   GFR: Estimated Creatinine Clearance: 69.1 mL/min (by C-G formula based on SCr of 0.67 mg/dL). Liver Function Tests: Recent Labs  Lab 08/02/23 2254  AST 17  ALT 15  ALKPHOS 41  BILITOT 1.2  PROT 7.3  ALBUMIN 3.7   CBG: Recent Labs  Lab 08/08/23 2014 08/08/23 2236 08/09/23 0125 08/09/23 0454 08/09/23 0817  GLUCAP 184* 180* 172* 193* 211*   Sepsis Labs: Recent Labs  Lab 08/02/23 2254 08/03/23 0040 08/03/23 0820 08/04/23 0337  PROCALCITON  --   --   --  0.23  LATICACIDVEN 2.1* 3.0* 1.5  --     Recent Results (from the past 240 hours)  Resp panel by RT-PCR (RSV, Flu A&B, Covid) Anterior Nasal Swab     Status: None   Collection Time: 08/02/23 10:40 PM   Specimen: Anterior  Nasal Swab  Result Value Ref Range Status   SARS Coronavirus 2 by RT PCR NEGATIVE NEGATIVE Final   Influenza A by PCR NEGATIVE NEGATIVE Final   Influenza B by PCR NEGATIVE NEGATIVE Final    Comment: (NOTE) The Xpert Xpress SARS-CoV-2/FLU/RSV plus assay is intended as an aid in the diagnosis of influenza from Nasopharyngeal swab specimens and should not be used as a sole basis for treatment. Nasal washings and aspirates are unacceptable for Xpert Xpress SARS-CoV-2/FLU/RSV testing.  Fact Sheet for Patients: BloggerCourse.com  Fact Sheet for Healthcare Providers: SeriousBroker.it  This test is not yet approved or cleared by the [place]  FDA and has been authorized for detection and/or diagnosis of SARS-CoV-2 by FDA under an Emergency Use Authorization (EUA). This EUA will remain in effect (meaning this test can be used) for the duration of the COVID-19 declaration under Section 564(b)(1) of the Act, 21 U.S.C. section 360bbb-3(b)(1), unless the authorization is terminated or revoked.     Resp Syncytial Virus by PCR NEGATIVE NEGATIVE Final    Comment: (NOTE) Fact Sheet for Patients: BloggerCourse.com  Fact Sheet for Healthcare Providers: SeriousBroker.it  This test is not yet approved or cleared by the [place]  FDA and has  been authorized for detection and/or diagnosis of SARS-CoV-2 by FDA under an Emergency Use Authorization (EUA). This EUA will remain in effect (meaning this test can be used) for the duration of the COVID-19 declaration under Section 564(b)(1) of the Act, 21 U.S.C. section 360bbb-3(b)(1), unless the authorization is terminated or revoked.  Performed at [facility], [address] [place], [place] [address]   Blood culture (routine x 2)     Status: None   Collection Time: 08/02/23 11:36 PM   Specimen: BLOOD  Result Value Ref Range Status    Specimen Description BLOOD SITE NOT SPECIFIED  Final   Special Requests   Final    BOTTLES DRAWN AEROBIC AND ANAEROBIC Blood Culture adequate volume   Culture   Final    NO GROWTH 5 DAYS Performed at [facility], [address], [place], [place] [address]    Report Status 08/08/2023 FINAL  Final  Blood culture (routine x 2)     Status: None   Collection Time: 08/02/23 11:41 PM   Specimen: BLOOD  Result Value Ref Range Status   Specimen Description BLOOD BLOOD RIGHT FOREARM  Final   Special Requests   Final    BOTTLES DRAWN AEROBIC AND ANAEROBIC Blood Culture adequate volume   Culture   Final    NO GROWTH 5 DAYS Performed at [facility], [address], [place], [place] [address]    Report Status 08/08/2023 FINAL  Final     Radiology Studies: CT ABDOMEN PELVIS WO CONTRAST Result Date: 08/08/2023 CLINICAL DATA:  Preprocedural evaluation for gastrostomy tube EXAM: CT ABDOMEN AND PELVIS WITHOUT CONTRAST TECHNIQUE: Multidetector CT imaging of the abdomen and pelvis was performed following the standard protocol without IV contrast. RADIATION DOSE REDUCTION: This exam was performed according to the departmental dose-optimization program which includes automated exposure control, adjustment of the mA and/or kV according to patient size and/or use of iterative reconstruction technique. COMPARISON:  07/25/2019 FINDINGS: Lower chest: Patchy left lower lobe airspace disease may reflect infection or aspiration. No effusion. Hepatobiliary: Stable dilation the common bile duct measuring 10 mm. Pneumobilia identified consistent with prior sphincterotomy. High attenuation material within the downstream common bile duct could reflect small nonobstructing calculi versus refluxed barium from recent modified barium swallow. Otherwise unremarkable unenhanced appearance of the liver. Prior cholecystectomy. Pancreas: Unremarkable unenhanced appearance. Spleen: Unremarkable unenhanced appearance.  Adrenals/Urinary Tract: No urinary tract calculi or obstructive uropathy within either kidney. The adrenals and bladder are unremarkable. Stomach/Bowel: Enteric catheter tip within the lumen of the gastric antrum. Barium within the stomach and proximal small bowel from previous modified barium swallow procedure. No bowel obstruction or ileus. No bowel wall thickening or inflammatory change. Vascular/Lymphatic: Aortic atherosclerosis. No enlarged abdominal or pelvic lymph nodes. Reproductive: Prior prostatectomy. No abnormality within the surgical bed. Other: No free fluid or free intraperitoneal gas. No abdominal wall hernia. Musculoskeletal: No acute or destructive bony abnormalities. Postsurgical changes from L4-5 posterior fusion. The reconstructed images demonstrate no additional findings. IMPRESSION: 1. Stable common bile duct dilation, with pneumobilia consistent with prior sphincterotomy. High density material within the downstream common bile duct may reflect punctate nonobstructing calculi versus reflux barium after recent modified barium swallow procedure. 2. Patchy left lower lobe airspace disease consistent with infection or aspiration. 3.  Aortic Atherosclerosis (ICD10-I70.0). Electronically Signed   By: Bobbye Burrow M.D.   On: 08/08/2023 22:44   DG Swallowing Func-Speech Pathology Result Date: 08/08/2023 Table formatting from the original result was not  included. Modified Barium Swallow Study Patient Details Name: Bruce Franklin MRN: [id number] Date of Birth: Nov 28, 1945 Today's Date: 08/08/2023 HPI/PMH: HPI: STETSEN NEPTUNE is a 78 y.o. male with medical history significant for hypertension, hyperlipidemia, CAD, chronic HFmrEF, COPD, and diabetes mellitus who presents with productive cough, fatigue, and confusion. Dx with sepsis due to PNA. REcent admission 08/02/23 for PNA. Note from 4/25 am reports "Patient saturations 87-88% on 1LNC. RN increased patient's oxygen to [facility]. RN changed patient's gown  and patient mentioned to RN "I have something in my throat". RN set up suction and suctioned oral cavity. A piece of sausage and egg was removed. RN made patient and MD aware of findings. Patient is NPO pending speech eval." Clinical Impression: Clinical Impression: Pt shows minimal change in function since initial MBS 4/25. He is still aspirating all liquids secondary to timing issues as well as limited airway protective mechanisms and reduced efficiency for pharyngeal clearance. Aspiration occurs before the swallow with thinner liquids (thin, nectar thick, sometimes honey thick) and on residue from honey thick liquids. He did not aspirate purees today but does have a fairly full valleculae after the swallow and penetrates a little (PAS 3, above the vocal folds but not ejected). He is limited in what strategies he can use given current mentation. Any strategies attempted that involved pt completion, he was not able to follow commands to implement (such as a chin tuck). Attempted strategies that SLP could modify, including varying bolus size, delivery method, consistencies, and positioning in chair. A partialy reclined posture helped with purees to significantly reduce residue by counteracting against the anterior curvature of his spine, allowing boluses to be better contained above the valleculae and giving the epilottis better inversion. Unfortunately this was not effective with honey thick liquids though, and aspiration still occurred. Discussed findings with MD as it seems like there is not a clear decision yet from family about how they would like to proceed. I would recommend f/u with SLP but they have been declining f/u services so far. If they prefer to accept aspiration risk and begin PO diet, would start with purees. Could do this with pudding thick liquids, but if truly comfort focused could offer thin liquids knowing that aspiration will occur. Will defer starting any diet at this time though as GOC are  not clearly outline. Recommend considering palliative care consult. Factors that may increase risk of adverse event in presence of aspiration Roderick Civatte & Jessy Morocco 2021): Factors that may increase risk of adverse event in presence of aspiration Roderick Civatte & Jessy Morocco 2021): Poor general health and/or compromised immunity; Respiratory or GI disease; Reduced cognitive function; Frail or deconditioned; Weak cough; Aspiration of thick, dense, and/or acidic materials; Frequent aspiration of large volumes Recommendations/Plan: Swallowing Evaluation Recommendations Swallowing Evaluation Recommendations Recommendations: NPO; Alternative means of nutrition - NG Tube Medication Administration: Via alternative means Oral care recommendations: Oral care QID (4x/day) Recommended consults: Consider Palliative care Caregiver Recommendations: Have oral suction available Treatment Plan Treatment Plan Treatment recommendations: Therapy as outlined in treatment plan below Follow-up recommendations: Skilled nursing-short term rehab (<3 hours/day) Functional status assessment: Patient has had a recent decline in their functional status and demonstrates the ability to make significant improvements in function in a reasonable and predictable amount of time. Treatment frequency: Min 2x/week Treatment duration: 2 weeks Interventions: Aspiration precaution training; Compensatory techniques; Patient/family education; Trials of upgraded texture/liquids Recommendations Recommendations for follow up therapy are one component of a multi-disciplinary discharge planning process, led by the attending physician.  Recommendations may be updated based on patient status, additional functional criteria and insurance authorization. Assessment: Orofacial Exam: Orofacial Exam Oral Cavity - Dentition: Edentulous Anatomy: Anatomy: Other (Comment) (anterior curvature of spine) Boluses Administered: Boluses Administered Boluses Administered: Thin liquids (Level 0);  Mildly thick liquids (Level 2, nectar thick); Moderately thick liquids (Level 3, honey thick); Puree  Oral Impairment Domain: Oral Impairment Domain Lip Closure: Escape beyond mid-chin Tongue control during bolus hold: Escape to lateral buccal cavity/floor of mouth Bolus preparation/mastication: -- (deferred) Bolus transport/lingual motion: Repetitive/disorganized tongue motion Oral residue: Residue collection on oral structures Location of oral residue : Floor of mouth; Tongue Initiation of pharyngeal swallow : Pyriform sinuses  Pharyngeal Impairment Domain: Pharyngeal Impairment Domain Soft palate elevation: No bolus between soft palate (SP)/pharyngeal wall (PW) Laryngeal elevation: Partial superior movement of thyroid cartilage/partial approximation of arytenoids to epiglottic petiole Anterior hyoid excursion: Complete anterior movement Epiglottic movement: Partial inversion Laryngeal vestibule closure: Incomplete, narrow column air/contrast in laryngeal vestibule Pharyngeal stripping wave : Present - diminished Pharyngeal contraction (A/P view only): N/A Pharyngoesophageal segment opening: Complete distension and complete duration, no obstruction of flow Tongue base retraction: Trace column of contrast or air between tongue base and PPW Pharyngeal residue: Collection of residue within or on pharyngeal structures Location of pharyngeal residue: Valleculae; Aryepiglottic folds; Pyriform sinuses  Esophageal Impairment Domain: No data recorded Pill: Pill Consistency administered: -- (deferred) Penetration/Aspiration Scale Score: Penetration/Aspiration Scale Score 2.  Material enters airway, remains ABOVE vocal cords then ejected out: Puree 7.  Material enters airway, passes BELOW cords and not ejected out despite cough attempt by patient: Thin liquids (Level 0); Mildly thick liquids (Level 2, nectar thick); Moderately thick liquids (Level 3, honey thick) 8.  Material enters airway, passes BELOW cords without attempt  by patient to eject out (silent aspiration) : Thin liquids (Level 0); Mildly thick liquids (Level 2, nectar thick); Moderately thick liquids (Level 3, honey thick) Compensatory Strategies: Compensatory Strategies Compensatory strategies: Yes Straw: Ineffective Ineffective Straw: Moderately thick liquid (Level 3, honey thick) (attempted to use to facilitate chin tuck but pt could not get liquid up via straw) Chin tuck: Ineffective Ineffective Chin Tuck: Moderately thick liquid (Level 3, honey thick) (could not even perform strategy) Reclining posture: Effective; Ineffective Effective Reclining Posture: Puree Ineffective Reclining Posture: Moderately thick liquid (Level 3, honey thick)   General Information: Caregiver present: No  Diet Prior to this Study: NPO; Cortrak/Small bore NG tube   Temperature : Normal   Respiratory Status: WFL   Supplemental O2: Nasal cannula   History of Recent Intubation: No  Behavior/Cognition: Alert; Confused; Requires cueing Self-Feeding Abilities: Needs assist with self-feeding Baseline vocal quality/speech: Hypophonia/low volume Volitional Cough: Able to elicit Volitional Swallow: Able to elicit (inconsistently) Exam Limitations: No limitations Goal Planning: Prognosis for improved oropharyngeal function: Fair Barriers to Reach Goals: Cognitive deficits; Severity of deficits; Other (Comment) (family declining any f/u therapy) No data recorded Patient/Family Stated Goal: none stated Consulted and agree with results and recommendations: Patient; Physician Pain: Pain Assessment Pain Assessment: Faces Faces Pain Scale: 0 End of Session: Start Time:SLP Start Time (ACUTE ONLY): 0913 Stop Time: SLP Stop Time (ACUTE ONLY): 0927 Time Calculation:SLP Time Calculation (min) (ACUTE ONLY): 14 min Charges: SLP Evaluations $ SLP Speech Visit: 1 Visit SLP Evaluations $MBS Swallow: 1 Procedure SLP visit diagnosis: SLP Visit Diagnosis: Dysphagia, oropharyngeal phase (R13.12) Past Medical History: Past  Medical History: Diagnosis Date  Acute biliary pancreatitis   Anxiety   Cholelithiasis   COPD (chronic obstructive pulmonary  disease) (HCC)   Diabetes mellitus without complication (HCC)   Pneumonia   Prostate cancer (HCC)   Smokes 1 pack of cigarettes per day  Past Surgical History: Past Surgical History: Procedure Laterality Date  CHOLECYSTECTOMY N/A 04/01/2016  Procedure: LAPAROSCOPIC CHOLECYSTECTOMY;  Surgeon: Adalberto Acton, MD;  Location: [facility] OR;  Service: General;  Laterality: N/A;  ERCP N/A 03/30/2016  Procedure: ENDOSCOPIC RETROGRADE CHOLANGIOPANCREATOGRAPHY (ERCP);  Surgeon: Janel Medford, MD;  Location: [facility] ENDOSCOPY;  Service: Endoscopy;  Laterality: N/A;  LUMBAR FUSION    PROSTATE SURGERY    ROTATOR CUFF REPAIR   Beth Brooke., M.A. CCC-SLP Acute Rehabilitation Services Office: [phone] Secure chat preferred 08/08/2023, 10:27 AM   Scheduled Meds:  feeding supplement (PROSource TF20)  60 mL Per Tube Daily   free water  100 mL Per Tube Q6H   Gerhardt's butt cream   Topical BID   insulin  aspart  0-9 Units Subcutaneous QID   insulin  glargine-yfgn  10 Units Subcutaneous Daily   iohexol   75 mL Per Tube Once   metoprolol  tartrate  5 mg Intravenous Q6H   multivitamin with minerals  1 tablet Per Tube Daily   sodium chloride  flush  3 mL Intravenous Q12H   thiamine   100 mg Per Tube Daily   Continuous Infusions:  azithromycin  Stopped (08/08/23 1228)   cefTRIAXone  (ROCEPHIN )  IV 1 g (08/09/23 0847)   dextrose  5% lactated ringers  50 mL/hr at 08/09/23 0013     LOS: 6 days   Time spent: 45 minutes  Unk Garb, DO  [facility]  08/09/2023, 10:15 AM

## 2023-08-09 NOTE — Consult Note (Signed)
 Chief Complaint: Patient was seen in consultation today for aspiration pneumonia   Procedure: Percutaneous Gastrostomy Tube Placement  Referring Physician(s): Dr. Unk Garb  Supervising Physician: Creasie Doctor  Patient Status: [facility] - In-pt  History of Present Illness: Bruce Franklin is a 78 y.o. male with a history of COPD, HFmrEF, DM, HTN, HLD, CAD who presented to the [facility] ED for productive cough, fatigue, confusion on 08/02/23. Upon arrival to the ED, patient is found to be afebrile and saturating 90% on room air with tachypnea, tachycardia, and stable BP. Lab work revealed elevated WBC and lactic; CXR concerning for PNA. He failed mod barium swallow 4/25 and 4/28, currently receiving enteral nutrition via NG.  IR consulted for g-tube request prior to discharge as patient is high risk for aspiration.   He is lying in bed at time of interview with spouse at bedside. Calm, cooperative, confused. Wearing 2L O2.   Code Status: Limited DNR confirmed with patient's spouse. Will be maintained in IR.   Past Medical History:  Diagnosis Date   Acute biliary pancreatitis    Anxiety    Cholelithiasis    COPD (chronic obstructive pulmonary disease) (HCC)    Diabetes mellitus without complication (HCC)    Pneumonia    Prostate cancer (HCC)    Smokes 1 pack of cigarettes per day     Past Surgical History:  Procedure Laterality Date   CHOLECYSTECTOMY N/A 04/01/2016   Procedure: LAPAROSCOPIC CHOLECYSTECTOMY;  Surgeon: Adalberto Acton, MD;  Location: [facility] OR;  Service: General;  Laterality: N/A;   ERCP N/A 03/30/2016   Procedure: ENDOSCOPIC RETROGRADE CHOLANGIOPANCREATOGRAPHY (ERCP);  Surgeon: Janel Medford, MD;  Location: [facility] ENDOSCOPY;  Service: Endoscopy;  Laterality: N/A;   LUMBAR FUSION     PROSTATE SURGERY     ROTATOR CUFF REPAIR      Allergies: Ibuprofen and Sulfa antibiotics  Medications: Prior to Admission medications   Medication Sig Start Date End Date Taking?  Authorizing Provider  albuterol  (VENTOLIN  HFA) 108 (90 Base) MCG/ACT inhaler Inhale 2 puffs into the lungs every 6 (six) hours as needed for shortness of breath or wheezing. 07/04/23  Yes [provider]  ALPRAZolam  (XANAX ) 0.5 MG tablet Take 0.5 mg by mouth 3 (three) times daily as needed for anxiety. 07/07/23  Yes [provider]  aspirin EC 81 MG tablet Take 81 mg by mouth daily.   Yes [provider]  gabapentin (NEURONTIN) 300 MG capsule Take 300 mg by mouth 2 (two) times daily as needed (pain). 06/05/23  Yes [provider]  metFORMIN  (GLUCOPHAGE ) 1000 MG tablet Take 1,000 mg by mouth 2 (two) times daily. 06/03/23  Yes [provider]  oxyCODONE -acetaminophen  (PERCOCET) 10-325 MG tablet Take 1 tablet by mouth 4 (four) times daily as needed for pain. 08/02/23  Yes [provider]  OZEMPIC, 1 MG/DOSE, 4 MG/3ML SOPN Inject 1 mg into the skin once a week. 04/30/23  Yes [provider]     Family History  Problem Relation Age of Onset   Heart attack Mother    COPD Father     Social History   Socioeconomic History   Marital status: Married    Spouse name: Not on file   Number of children: Not on file   Years of education: Not on file   Highest education level: Not on file  Occupational History   Not on file  Tobacco Use   Smoking status: Every Day    Current packs/day: 1.00  Average packs/day: 1 pack/day for 52.0 years (52.0 ttl pk-yrs)    Types: Cigarettes   Smokeless tobacco: Not on file  Substance and Sexual Activity   Alcohol  use: No   Drug use: No   Sexual activity: Not on file  Other Topics Concern   Not on file  Social History Narrative   Not on file   Social Drivers of Health   Financial Resource Strain: Not on file  Food Insecurity: No Food Insecurity (08/04/2023)   Hunger Vital Sign    Worried About Running Out of Food in the Last Year: Never true    Ran Out of Food in the Last Year: Never true   Transportation Needs: Unmet Transportation Needs (08/04/2023)   PRAPARE - Administrator, Civil Service (Medical): Yes    Lack of Transportation (Non-Medical): Yes  Physical Activity: Not on file  Stress: Not on file  Social Connections: Socially Isolated (08/04/2023)   Social Connection and Isolation Panel [NHANES]    Frequency of Communication with Friends and Family: Never    Frequency of Social Gatherings with Friends and Family: Never    Attends Religious Services: Never    Database administrator or Organizations: No    Attends Banker Meetings: Never    Marital Status: Married    Review of Systems  Constitutional:  Negative for chills and fever.  HENT:  Negative for drooling.   Respiratory:  Positive for shortness of breath.   Cardiovascular:  Negative for chest pain.  Gastrointestinal:  Positive for abdominal pain. Negative for abdominal distention, blood in stool, nausea and vomiting.  Neurological:  Negative for headaches.  Hematological:  Does not bruise/bleed easily.  Psychiatric/Behavioral:  Positive for confusion.    Denies any N/V, chest pain, shortness of breath, fevers/chills. All other ROS negative.  Vital Signs: BP 135/85 (BP Location: Left Arm)   Pulse 89   Temp 98.5 F (36.9 C) (Oral)   Resp (!) 21   Ht 6\' 3"  (1.905 m)   Wt 139 lb 5.3 oz (63.2 kg)   SpO2 90%   BMI 17.42 kg/m   Advance Care Plan: The advanced care plan/surrogate decision maker was discussed at the time of visit and documented in the medical record.    Physical Exam HENT:     Mouth/Throat:     Mouth: Mucous membranes are moist.     Pharynx: Oropharynx is clear.     Comments: NG tube present and intact Cardiovascular:     Rate and Rhythm: Normal rate. Rhythm irregular.     Pulses: Normal pulses.     Heart sounds: Normal heart sounds.  Pulmonary:     Effort: Pulmonary effort is normal.     Breath sounds: Rhonchi present.  Chest:     Chest wall: No  tenderness.  Abdominal:     Palpations: Abdomen is soft.     Tenderness: There is abdominal tenderness.     Comments: RUQ  Musculoskeletal:     Right lower leg: No edema.     Left lower leg: No edema.  Skin:    General: Skin is warm and dry.     Comments: No rash or wound over planned puncture location  Neurological:     Mental Status: He is alert. He is disoriented.  Psychiatric:        Mood and Affect: Mood normal.        Behavior: Behavior normal.        Thought  Content: Thought content normal.        Judgment: Judgment normal.     Imaging: CT ABDOMEN PELVIS WO CONTRAST Result Date: 08/08/2023 CLINICAL DATA:  Preprocedural evaluation for gastrostomy tube EXAM: CT ABDOMEN AND PELVIS WITHOUT CONTRAST TECHNIQUE: Multidetector CT imaging of the abdomen and pelvis was performed following the standard protocol without IV contrast. RADIATION DOSE REDUCTION: This exam was performed according to the departmental dose-optimization program which includes automated exposure control, adjustment of the mA and/or kV according to patient size and/or use of iterative reconstruction technique. COMPARISON:  07/25/2019 FINDINGS: Lower chest: Patchy left lower lobe airspace disease may reflect infection or aspiration. No effusion. Hepatobiliary: Stable dilation the common bile duct measuring 10 mm. Pneumobilia identified consistent with prior sphincterotomy. High attenuation material within the downstream common bile duct could reflect small nonobstructing calculi versus refluxed barium from recent modified barium swallow. Otherwise unremarkable unenhanced appearance of the liver. Prior cholecystectomy. Pancreas: Unremarkable unenhanced appearance. Spleen: Unremarkable unenhanced appearance. Adrenals/Urinary Tract: No urinary tract calculi or obstructive uropathy within either kidney. The adrenals and bladder are unremarkable. Stomach/Bowel: Enteric catheter tip within the lumen of the gastric antrum. Barium  within the stomach and proximal small bowel from previous modified barium swallow procedure. No bowel obstruction or ileus. No bowel wall thickening or inflammatory change. Vascular/Lymphatic: Aortic atherosclerosis. No enlarged abdominal or pelvic lymph nodes. Reproductive: Prior prostatectomy. No abnormality within the surgical bed. Other: No free fluid or free intraperitoneal gas. No abdominal wall hernia. Musculoskeletal: No acute or destructive bony abnormalities. Postsurgical changes from L4-5 posterior fusion. The reconstructed images demonstrate no additional findings. IMPRESSION: 1. Stable common bile duct dilation, with pneumobilia consistent with prior sphincterotomy. High density material within the downstream common bile duct may reflect punctate nonobstructing calculi versus reflux barium after recent modified barium swallow procedure. 2. Patchy left lower lobe airspace disease consistent with infection or aspiration. 3.  Aortic Atherosclerosis (ICD10-I70.0). Electronically Signed   By: Bobbye Burrow M.D.   On: 08/08/2023 22:44   DG Swallowing Func-Speech Pathology Result Date: 08/08/2023 Table formatting from the original result was not included. Modified Barium Swallow Study Patient Details Name: JAX KAWAMURA MRN: [id number] Date of Birth: 14-Dec-1945 Today's Date: 08/08/2023 HPI/PMH: HPI: Bruce Franklin is a 78 y.o. male with medical history significant for hypertension, hyperlipidemia, CAD, chronic HFmrEF, COPD, and diabetes mellitus who presents with productive cough, fatigue, and confusion. Dx with sepsis due to PNA. REcent admission 08/02/23 for PNA. Note from 4/25 am reports "Patient saturations 87-88% on 1LNC. RN increased patient's oxygen to [facility]. RN changed patient's gown and patient mentioned to RN "I have something in my throat". RN set up suction and suctioned oral cavity. A piece of sausage and egg was removed. RN made patient and MD aware of findings. Patient is NPO pending speech  eval." Clinical Impression: Clinical Impression: Pt shows minimal change in function since initial MBS 4/25. He is still aspirating all liquids secondary to timing issues as well as limited airway protective mechanisms and reduced efficiency for pharyngeal clearance. Aspiration occurs before the swallow with thinner liquids (thin, nectar thick, sometimes honey thick) and on residue from honey thick liquids. He did not aspirate purees today but does have a fairly full valleculae after the swallow and penetrates a little (PAS 3, above the vocal folds but not ejected). He is limited in what strategies he can use given current mentation. Any strategies attempted that involved pt completion, he was not able to follow commands to implement (such  as a chin tuck). Attempted strategies that SLP could modify, including varying bolus size, delivery method, consistencies, and positioning in chair. A partialy reclined posture helped with purees to significantly reduce residue by counteracting against the anterior curvature of his spine, allowing boluses to be better contained above the valleculae and giving the epilottis better inversion. Unfortunately this was not effective with honey thick liquids though, and aspiration still occurred. Discussed findings with MD as it seems like there is not a clear decision yet from family about how they would like to proceed. I would recommend f/u with SLP but they have been declining f/u services so far. If they prefer to accept aspiration risk and begin PO diet, would start with purees. Could do this with pudding thick liquids, but if truly comfort focused could offer thin liquids knowing that aspiration will occur. Will defer starting any diet at this time though as GOC are not clearly outline. Recommend considering palliative care consult. Factors that may increase risk of adverse event in presence of aspiration Roderick Civatte & Jessy Morocco 2021): Factors that may increase risk of adverse event in  presence of aspiration Roderick Civatte & Jessy Morocco 2021): Poor general health and/or compromised immunity; Respiratory or GI disease; Reduced cognitive function; Frail or deconditioned; Weak cough; Aspiration of thick, dense, and/or acidic materials; Frequent aspiration of large volumes Recommendations/Plan: Swallowing Evaluation Recommendations Swallowing Evaluation Recommendations Recommendations: NPO; Alternative means of nutrition - NG Tube Medication Administration: Via alternative means Oral care recommendations: Oral care QID (4x/day) Recommended consults: Consider Palliative care Caregiver Recommendations: Have oral suction available Treatment Plan Treatment Plan Treatment recommendations: Therapy as outlined in treatment plan below Follow-up recommendations: Skilled nursing-short term rehab (<3 hours/day) Functional status assessment: Patient has had a recent decline in their functional status and demonstrates the ability to make significant improvements in function in a reasonable and predictable amount of time. Treatment frequency: Min 2x/week Treatment duration: 2 weeks Interventions: Aspiration precaution training; Compensatory techniques; Patient/family education; Trials of upgraded texture/liquids Recommendations Recommendations for follow up therapy are one component of a multi-disciplinary discharge planning process, led by the attending physician.  Recommendations may be updated based on patient status, additional functional criteria and insurance authorization. Assessment: Orofacial Exam: Orofacial Exam Oral Cavity - Dentition: Edentulous Anatomy: Anatomy: Other (Comment) (anterior curvature of spine) Boluses Administered: Boluses Administered Boluses Administered: Thin liquids (Level 0); Mildly thick liquids (Level 2, nectar thick); Moderately thick liquids (Level 3, honey thick); Puree  Oral Impairment Domain: Oral Impairment Domain Lip Closure: Escape beyond mid-chin Tongue control during bolus hold:  Escape to lateral buccal cavity/floor of mouth Bolus preparation/mastication: -- (deferred) Bolus transport/lingual motion: Repetitive/disorganized tongue motion Oral residue: Residue collection on oral structures Location of oral residue : Floor of mouth; Tongue Initiation of pharyngeal swallow : Pyriform sinuses  Pharyngeal Impairment Domain: Pharyngeal Impairment Domain Soft palate elevation: No bolus between soft palate (SP)/pharyngeal wall (PW) Laryngeal elevation: Partial superior movement of thyroid cartilage/partial approximation of arytenoids to epiglottic petiole Anterior hyoid excursion: Complete anterior movement Epiglottic movement: Partial inversion Laryngeal vestibule closure: Incomplete, narrow column air/contrast in laryngeal vestibule Pharyngeal stripping wave : Present - diminished Pharyngeal contraction (A/P view only): N/A Pharyngoesophageal segment opening: Complete distension and complete duration, no obstruction of flow Tongue base retraction: Trace column of contrast or air between tongue base and PPW Pharyngeal residue: Collection of residue within or on pharyngeal structures Location of pharyngeal residue: Valleculae; Aryepiglottic folds; Pyriform sinuses  Esophageal Impairment Domain: No data recorded Pill: Pill Consistency administered: -- (deferred) Penetration/Aspiration Scale  Score: Penetration/Aspiration Scale Score 2.  Material enters airway, remains ABOVE vocal cords then ejected out: Puree 7.  Material enters airway, passes BELOW cords and not ejected out despite cough attempt by patient: Thin liquids (Level 0); Mildly thick liquids (Level 2, nectar thick); Moderately thick liquids (Level 3, honey thick) 8.  Material enters airway, passes BELOW cords without attempt by patient to eject out (silent aspiration) : Thin liquids (Level 0); Mildly thick liquids (Level 2, nectar thick); Moderately thick liquids (Level 3, honey thick) Compensatory Strategies: Compensatory Strategies  Compensatory strategies: Yes Straw: Ineffective Ineffective Straw: Moderately thick liquid (Level 3, honey thick) (attempted to use to facilitate chin tuck but pt could not get liquid up via straw) Chin tuck: Ineffective Ineffective Chin Tuck: Moderately thick liquid (Level 3, honey thick) (could not even perform strategy) Reclining posture: Effective; Ineffective Effective Reclining Posture: Puree Ineffective Reclining Posture: Moderately thick liquid (Level 3, honey thick)   General Information: Caregiver present: No  Diet Prior to this Study: NPO; Cortrak/Small bore NG tube   Temperature : Normal   Respiratory Status: WFL   Supplemental O2: Nasal cannula   History of Recent Intubation: No  Behavior/Cognition: Alert; Confused; Requires cueing Self-Feeding Abilities: Needs assist with self-feeding Baseline vocal quality/speech: Hypophonia/low volume Volitional Cough: Able to elicit Volitional Swallow: Able to elicit (inconsistently) Exam Limitations: No limitations Goal Planning: Prognosis for improved oropharyngeal function: Fair Barriers to Reach Goals: Cognitive deficits; Severity of deficits; Other (Comment) (family declining any f/u therapy) No data recorded Patient/Family Stated Goal: none stated Consulted and agree with results and recommendations: Patient; Physician Pain: Pain Assessment Pain Assessment: Faces Faces Pain Scale: 0 End of Session: Start Time:SLP Start Time (ACUTE ONLY): 0913 Stop Time: SLP Stop Time (ACUTE ONLY): 0927 Time Calculation:SLP Time Calculation (min) (ACUTE ONLY): 14 min Charges: SLP Evaluations $ SLP Speech Visit: 1 Visit SLP Evaluations $MBS Swallow: 1 Procedure SLP visit diagnosis: SLP Visit Diagnosis: Dysphagia, oropharyngeal phase (R13.12) Past Medical History: Past Medical History: Diagnosis Date  Acute biliary pancreatitis   Anxiety   Cholelithiasis   COPD (chronic obstructive pulmonary disease) (HCC)   Diabetes mellitus without complication (HCC)   Pneumonia   Prostate  cancer (HCC)   Smokes 1 pack of cigarettes per day  Past Surgical History: Past Surgical History: Procedure Laterality Date  CHOLECYSTECTOMY N/A 04/01/2016  Procedure: LAPAROSCOPIC CHOLECYSTECTOMY;  Surgeon: Adalberto Acton, MD;  Location: [facility] OR;  Service: General;  Laterality: N/A;  ERCP N/A 03/30/2016  Procedure: ENDOSCOPIC RETROGRADE CHOLANGIOPANCREATOGRAPHY (ERCP);  Surgeon: Janel Medford, MD;  Location: [facility] ENDOSCOPY;  Service: Endoscopy;  Laterality: N/A;  LUMBAR FUSION    PROSTATE SURGERY    ROTATOR CUFF REPAIR   Beth Brooke., M.A. CCC-SLP Acute Rehabilitation Services Office: [phone] Secure chat preferred 08/08/2023, 10:27 AM  DG Swallowing Func-Speech Pathology Result Date: 08/05/2023 Table formatting from the original result was not included. Modified Barium Swallow Study Patient Details Name: Bruce Franklin MRN: [id number] Date of Birth: 07/08/1945 Today's Date: 08/05/2023 HPI/PMH: HPI: Bruce Franklin is a 78 y.o. male with medical history significant for hypertension, hyperlipidemia, CAD, chronic HFmrEF, COPD, and diabetes mellitus who presents with productive cough, fatigue, and confusion. Dx with sepsis due to PNA. REcent admission 08/02/23 for PNA. Note from 4/25 am reports "Patient saturations 87-88% on 1LNC. RN increased patient's oxygen to [facility]. RN changed patient's gown and patient mentioned to RN "I have something in my throat". RN set up suction and suctioned oral cavity. A piece of sausage and  egg was removed. RN made patient and MD aware of findings. Patient is NPO pending speech eval." Clinical Impression: Clinical Impression: Pt has a significant oropharyngeal dysphagia with DIGEST score of 3 suggestive of severe dysfunction. Mentation seems to impact consistency of oral manipulation and transit, sometimes exhibiting brisk responses and other times more repetitive and disorganized. Boluses spill into his pyriform sinuses before the swallow, and sometimes spill over his arytenoids into his  airway (PAS 8, silent aspiration). He also has incomplete laryngeal elevation, laryngeal vestibule closure, and epiglottic inversion. Reduced base of tongue retraction and pharyngeal squeeze also lead to residue througout the pharynx and this residue is also aspirated silently (PAS 8). Aspiration occurred regardless of consistency tested. Attempted compensatory strategies but cognitively he was not able to implement them. Recommend that he be NPO. Would place emphasis on oral hygiene to keep up with moisture as much as possible. SLP will follow up for trial of therapy and potential to resume PO diet. DIGEST Swallow Severity Rating*  Safety: 3  Efficiency:1  Overall Pharyngeal Swallow Severity: 3 1: mild; 2: moderate; 3: severe; 4: profound *The Dynamic Imaging Grade of Swallowing Toxicity is standardized for the head and neck cancer population, however, demonstrates promising clinical applications across populations to standardize the clinical rating of pharyngeal swallow safety and severity. Factors that may increase risk of adverse event in presence of aspiration Roderick Civatte & Jessy Morocco 2021): Factors that may increase risk of adverse event in presence of aspiration Roderick Civatte & Jessy Morocco 2021): Poor general health and/or compromised immunity; Respiratory or GI disease; Reduced cognitive function; Frail or deconditioned; Weak cough; Aspiration of thick, dense, and/or acidic materials; Frequent aspiration of large volumes Recommendations/Plan: Swallowing Evaluation Recommendations Swallowing Evaluation Recommendations Recommendations: NPO; Alternative means of nutrition - NG Tube Medication Administration: Via alternative means Oral care recommendations: Oral care QID (4x/day) Caregiver Recommendations: Have oral suction available Treatment Plan Treatment Plan Treatment recommendations: Therapy as outlined in treatment plan below Follow-up recommendations: Skilled nursing-short term rehab (<3 hours/day) Functional status  assessment: Patient has had a recent decline in their functional status and demonstrates the ability to make significant improvements in function in a reasonable and predictable amount of time. Treatment frequency: Min 2x/week Treatment duration: 2 weeks Interventions: Aspiration precaution training; Compensatory techniques; Patient/family education; Trials of upgraded texture/liquids Recommendations Recommendations for follow up therapy are one component of a multi-disciplinary discharge planning process, led by the attending physician.  Recommendations may be updated based on patient status, additional functional criteria and insurance authorization. Assessment: Orofacial Exam: Orofacial Exam Oral Cavity - Dentition: Edentulous Anatomy: Anatomy: Other (Comment) (anterior curvature of spine) Boluses Administered: Boluses Administered Boluses Administered: Thin liquids (Level 0); Mildly thick liquids (Level 2, nectar thick); Moderately thick liquids (Level 3, honey thick); Puree  Oral Impairment Domain: Oral Impairment Domain Lip Closure: Interlabial escape, no progression to anterior lip Tongue control during bolus hold: Escape to lateral buccal cavity/floor of mouth Bolus preparation/mastication: -- (deferred) Bolus transport/lingual motion: Repetitive/disorganized tongue motion Oral residue: Residue collection on oral structures Location of oral residue : Floor of mouth; Tongue Initiation of pharyngeal swallow : Pyriform sinuses  Pharyngeal Impairment Domain: Pharyngeal Impairment Domain Soft palate elevation: No bolus between soft palate (SP)/pharyngeal wall (PW) Laryngeal elevation: Partial superior movement of thyroid cartilage/partial approximation of arytenoids to epiglottic petiole Anterior hyoid excursion: Complete anterior movement Epiglottic movement: Partial inversion Laryngeal vestibule closure: Incomplete, narrow column air/contrast in laryngeal vestibule Pharyngeal stripping wave : Present -  diminished Pharyngeal contraction (A/P view only): N/A Pharyngoesophageal  segment opening: Partial distention/partial duration, partial obstruction of flow Tongue base retraction: Trace column of contrast or air between tongue base and PPW Pharyngeal residue: Collection of residue within or on pharyngeal structures Location of pharyngeal residue: Valleculae; Aryepiglottic folds; Pyriform sinuses  Esophageal Impairment Domain: No data recorded Pill: Pill Consistency administered: -- (deferred) Penetration/Aspiration Scale Score: Penetration/Aspiration Scale Score 8.  Material enters airway, passes BELOW cords without attempt by patient to eject out (silent aspiration) : Thin liquids (Level 0); Mildly thick liquids (Level 2, nectar thick); Moderately thick liquids (Level 3, honey thick); Puree Compensatory Strategies: Compensatory Strategies Compensatory strategies: Yes Chin tuck: Ineffective Ineffective Chin Tuck: Puree   General Information: Caregiver present: No  Diet Prior to this Study: Regular; Thin liquids (Level 0)   Temperature : Normal   Respiratory Status: WFL   Supplemental O2: Nasal cannula   History of Recent Intubation: No  Behavior/Cognition: Alert; Confused; Requires cueing Self-Feeding Abilities: Needs assist with self-feeding Baseline vocal quality/speech: Hypophonia/low volume Volitional Cough: Able to elicit Volitional Swallow: Able to elicit Exam Limitations: No limitations Goal Planning: Prognosis for improved oropharyngeal function: Fair Barriers to Reach Goals: Severity of deficits; Other (Comment) (unclear PLOF) No data recorded Patient/Family Stated Goal: none stated Consulted and agree with results and recommendations: Patient; Nurse; Physician; Dietitian Pain: Pain Assessment Pain Assessment: Faces Faces Pain Scale: 0 Breathing: 0 Negative Vocalization: 0 Facial Expression: 0 Body Language: 0 Consolability: 0 PAINAD Score: 0 Pain Intervention(s): Monitored during session End of Session:  Start Time:SLP Start Time (ACUTE ONLY): 1446 Stop Time: SLP Stop Time (ACUTE ONLY): 1504 Time Calculation:SLP Time Calculation (min) (ACUTE ONLY): 18 min Charges: SLP Evaluations $ SLP Speech Visit: 1 Visit SLP Evaluations $BSS Swallow: 1 Procedure $MBS Swallow: 1 Procedure SLP visit diagnosis: SLP Visit Diagnosis: Dysphagia, oropharyngeal phase (R13.12) Past Medical History: Past Medical History: Diagnosis Date  Acute biliary pancreatitis   Anxiety   Cholelithiasis   COPD (chronic obstructive pulmonary disease) (HCC)   Diabetes mellitus without complication (HCC)   Pneumonia   Prostate cancer (HCC)   Smokes 1 pack of cigarettes per day  Past Surgical History: Past Surgical History: Procedure Laterality Date  CHOLECYSTECTOMY N/A 04/01/2016  Procedure: LAPAROSCOPIC CHOLECYSTECTOMY;  Surgeon: Adalberto Acton, MD;  Location: [facility] OR;  Service: General;  Laterality: N/A;  ERCP N/A 03/30/2016  Procedure: ENDOSCOPIC RETROGRADE CHOLANGIOPANCREATOGRAPHY (ERCP);  Surgeon: Janel Medford, MD;  Location: [facility] ENDOSCOPY;  Service: Endoscopy;  Laterality: N/A;  LUMBAR FUSION    PROSTATE SURGERY    ROTATOR CUFF REPAIR   Beth Brooke., M.A. CCC-SLP Acute Rehabilitation Services Office: [phone] Secure chat preferred 08/05/2023, 4:11 PM  ECHOCARDIOGRAM COMPLETE Result Date: 08/04/2023    ECHOCARDIOGRAM REPORT   Patient Name:   Bruce Franklin Date of Exam: 08/04/2023 Medical Rec #:  [id number]      Height:       75.0 in Accession #:    [id number]     Weight:       160.0 lb Date of Birth:  Feb 18, 1946     BSA:          1.996 m Patient Age:    77 years       BP:           113/69 mmHg Patient Gender: M              HR:           115 bpm. Exam Location:  Inpatient Procedure: 2D Echo, Cardiac Doppler, Color Doppler  and Intracardiac            Opacification Agent (Both Spectral and Color Flow Doppler were            utilized during procedure). Indications:    Atrial fibrillation  History:        Patient has no prior history of  Echocardiogram examinations.                 CAD, COPD; Risk Factors:Diabetes and Hypertension.  Sonographer:    Aldon Ambrosia Referring Phys: [id number] ERIC CHEN IMPRESSIONS  1. Left ventricular ejection fraction, by estimation, is 50 to 55%. The left ventricle has low normal function. Left ventricular endocardial border not optimally defined to evaluate regional wall motion but suspect apical hypokinesis. Left ventricular diastolic parameters are indeterminate.  2. Right ventricular systolic function is normal. The right ventricular size is normal. Tricuspid regurgitation signal is inadequate for assessing PA pressure.  3. The mitral valve is normal in structure. No evidence of mitral valve regurgitation. No evidence of mitral stenosis.  4. The aortic valve is tricuspid. There is mild calcification of the aortic valve. Aortic valve regurgitation is not visualized. No aortic stenosis is present.  5. The inferior vena cava is normal in size with <50% respiratory variability, suggesting right atrial pressure of 8 mmHg.  6. Technically difficult study with poor acoustic windows. FINDINGS  Left Ventricle: Left ventricular ejection fraction, by estimation, is 50 to 55%. The left ventricle has low normal function. Left ventricular endocardial border not optimally defined to evaluate regional wall motion. The left ventricular internal cavity  size was normal in size. There is no left ventricular hypertrophy. Left ventricular diastolic parameters are indeterminate. Right Ventricle: The right ventricular size is normal. No increase in right ventricular wall thickness. Right ventricular systolic function is normal. Tricuspid regurgitation signal is inadequate for assessing PA pressure. Left Atrium: Left atrial size was normal in size. Right Atrium: Right atrial size was normal in size. Pericardium: There is no evidence of pericardial effusion. Mitral Valve: The mitral valve is normal in structure. No evidence of mitral valve  regurgitation. No evidence of mitral valve stenosis. MV peak gradient, 1.1 mmHg. The mean mitral valve gradient is 1.0 mmHg. Tricuspid Valve: The tricuspid valve is normal in structure. Tricuspid valve regurgitation is not demonstrated. Aortic Valve: The aortic valve is tricuspid. There is mild calcification of the aortic valve. Aortic valve regurgitation is not visualized. No aortic stenosis is present. Aortic valve mean gradient measures 1.0 mmHg. Aortic valve peak gradient measures 2.3 mmHg. Aortic valve area, by VTI measures 3.39 cm. Pulmonic Valve: The pulmonic valve was normal in structure. Pulmonic valve regurgitation is not visualized. Aorta: The aortic root is normal in size and structure. Venous: The inferior vena cava is normal in size with less than 50% respiratory variability, suggesting right atrial pressure of 8 mmHg. IAS/Shunts: The interatrial septum was not well visualized.  LEFT VENTRICLE PLAX 2D LVIDd:         5.40 cm     Diastology LVIDs:         3.50 cm     LV e' medial:    6.85 cm/s LV PW:         0.70 cm     LV E/e' medial:  7.3 LV IVS:        0.80 cm     LV e' lateral:   11.90 cm/s LVOT diam:     2.10 cm     LV  E/e' lateral: 4.2 LV SV:         33 LV SV Index:   17 LVOT Area:     3.46 cm  LV Volumes (MOD) LV vol d, MOD A2C: 83.1 ml LV vol d, MOD A4C: 63.1 ml LV vol s, MOD A2C: 23.8 ml LV vol s, MOD A4C: 26.9 ml LV SV MOD A2C:     59.3 ml LV SV MOD A4C:     63.1 ml LV SV MOD BP:      50.3 ml RIGHT VENTRICLE             IVC RV Basal diam:  3.70 cm     IVC diam: 1.70 cm RV Mid diam:    2.40 cm RV S prime:     13.10 cm/s LEFT ATRIUM           Index        RIGHT ATRIUM           Index LA diam:      3.20 cm 1.60 cm/m   RA Area:     10.50 cm LA Vol (A4C): 30.4 ml 15.23 ml/m  RA Volume:   23.40 ml  11.72 ml/m  AORTIC VALVE                    PULMONIC VALVE AV Area (Vmax):    3.19 cm     PV Vmax:       0.99 m/s AV Area (Vmean):   3.30 cm     PV Peak grad:  3.9 mmHg AV Area (VTI):     3.39 cm  AV Vmax:           76.45 cm/s AV Vmean:          54.550 cm/s AV VTI:            0.098 m AV Peak Grad:      2.3 mmHg AV Mean Grad:      1.0 mmHg LVOT Vmax:         70.40 cm/s LVOT Vmean:        52.000 cm/s LVOT VTI:          0.096 m LVOT/AV VTI ratio: 0.98  AORTA Ao Root diam: 3.50 cm Ao Asc diam:  2.70 cm MITRAL VALVE MV Area (PHT): 4.65 cm    SHUNTS MV Area VTI:   4.29 cm    Systemic VTI:  0.10 m MV Peak grad:  1.1 mmHg    Systemic Diam: 2.10 cm MV Mean grad:  1.0 mmHg MV Vmax:       0.52 m/s MV Vmean:      33.0 cm/s MV Decel Time: 163 msec MV E velocity: 50.30 cm/s Dalton McleanMD Electronically signed by Archer Bear Signature Date/Time: 08/04/2023/8:27:31 AM    Final    DG Chest Portable 1 View Result Date: 08/02/2023 CLINICAL DATA:  Weakness EXAM: PORTABLE CHEST 1 VIEW COMPARISON:  07/25/2019 FINDINGS: Emphysema. Mild ground-glass infiltrate in the left lung base. No pleural effusion. Normal cardiac size. No pneumothorax IMPRESSION: Emphysema. Mild ground-glass infiltrate in the left lung base, probable pneumonia. Imaging follow-up to resolution is recommended Electronically Signed   By: Esmeralda Hedge M.D.   On: 08/02/2023 23:04    Labs:  CBC: Recent Labs    08/04/23 0337 08/05/23 0402 08/06/23 0933 08/09/23 0031  WBC 10.4 12.6* 9.3 11.2*  HGB 16.2 16.2 16.3 15.6  HCT 46.9 47.2 48.3 45.5  PLT 213 235 257  268    COAGS: Recent Labs    08/03/23 2023  APTT 26    BMP: Recent Labs    08/04/23 0337 08/05/23 0402 08/06/23 0933 08/07/23 0304  NA 138 135 137 137  K 3.2* 4.0 3.7 3.9  CL 102 102 105 104  CO2 23 21* 20* 20*  GLUCOSE 231* 178* 207* 252*  BUN 9 9 13 17   CALCIUM  8.9 8.8* 9.5 9.6  CREATININE 0.72 0.77 0.75 0.67  GFRNONAA >60 >60 >60 >60    LIVER FUNCTION TESTS: Recent Labs    08/02/23 2254  BILITOT 1.2  AST 17  ALT 15  ALKPHOS 41  PROT 7.3  ALBUMIN 3.7    TUMOR MARKERS: No results for input(s): "AFPTM", "CEA", "CA199", "CHROMGRNA" in the last 8760  hours.  Assessment and Plan:  Request for image guided gastrostomy tube placement was reviewed with Dr. Jinx Mourning who notes anatomy appears marginally amenable based on 4/25 CT abd 4/28. Plan will be for tentative g-tube placement on 4/30. Oral contrast will be given via feeding tube ~24 hrs prior to the procedure with repeat KUB in AM in preparation. Pending this and view of stomach after insufflation at the start of procedure, IR plans to proceed with gastrostomy.  IP team and family aware of potential barriers to completing this tomorrow based on above noted anatomy challenges.   Labs within acceptable limits pending 4/30 AM labs. Lovenox  held. VSS. Will be NPO after MN and tube feedings held.   Risks and benefits image guided gastrostomy tube placement was discussed with the patient including, but not limited to the need for a barium enema during the procedure, bleeding, infection, peritonitis and/or damage to adjacent structures.  All of the patient's questions were answered, patient is agreeable to proceed.  Consent signed and in chart.    Electronically Signed: Terressa Fess, NP 08/09/2023, 10:20 AM   I spent a total of 40 Minutes in face to face clinical consultation, greater than 50% of which was counseling/coordinating care for percutaneous gastrostomy tube placement.

## 2023-08-09 NOTE — Plan of Care (Signed)
   Problem: Nutritional: Goal: Maintenance of adequate nutrition will improve Outcome: Progressing   Problem: Nutritional: Goal: Progress toward achieving an optimal weight will improve Outcome: Progressing   Problem: Skin Integrity: Goal: Risk for impaired skin integrity will decrease Outcome: Progressing

## 2023-08-09 NOTE — Progress Notes (Signed)
 Call pharmacy about Omnipaque , and the person I spoke with said it seems to her that the medication was verified to be given in radiology when the patient gets there.

## 2023-08-10 ENCOUNTER — Inpatient Hospital Stay (HOSPITAL_COMMUNITY)

## 2023-08-10 DIAGNOSIS — A419 Sepsis, unspecified organism: Secondary | ICD-10-CM | POA: Diagnosis not present

## 2023-08-10 DIAGNOSIS — J189 Pneumonia, unspecified organism: Secondary | ICD-10-CM | POA: Diagnosis not present

## 2023-08-10 HISTORY — PX: IR GASTROSTOMY TUBE MOD SED: IMG625

## 2023-08-10 LAB — CBC
HCT: 45.5 % (ref 39.0–52.0)
Hemoglobin: 15.1 g/dL (ref 13.0–17.0)
MCH: 30.6 pg (ref 26.0–34.0)
MCHC: 33.2 g/dL (ref 30.0–36.0)
MCV: 92.3 fL (ref 80.0–100.0)
Platelets: 278 10*3/uL (ref 150–400)
RBC: 4.93 MIL/uL (ref 4.22–5.81)
RDW: 13.2 % (ref 11.5–15.5)
WBC: 11.6 10*3/uL — ABNORMAL HIGH (ref 4.0–10.5)
nRBC: 0 % (ref 0.0–0.2)

## 2023-08-10 LAB — GLUCOSE, CAPILLARY
Glucose-Capillary: 181 mg/dL — ABNORMAL HIGH (ref 70–99)
Glucose-Capillary: 182 mg/dL — ABNORMAL HIGH (ref 70–99)
Glucose-Capillary: 185 mg/dL — ABNORMAL HIGH (ref 70–99)
Glucose-Capillary: 189 mg/dL — ABNORMAL HIGH (ref 70–99)
Glucose-Capillary: 206 mg/dL — ABNORMAL HIGH (ref 70–99)
Glucose-Capillary: 211 mg/dL — ABNORMAL HIGH (ref 70–99)

## 2023-08-10 LAB — PROTIME-INR
INR: 1.1 (ref 0.8–1.2)
Prothrombin Time: 14.2 s (ref 11.4–15.2)

## 2023-08-10 MED ORDER — ENOXAPARIN SODIUM 60 MG/0.6ML IJ SOSY: 60.0000 mg | PREFILLED_SYRINGE | Freq: Two times a day (BID) | INTRAMUSCULAR | Status: DC

## 2023-08-10 MED ORDER — NUTRISOURCE FIBER PO PACK
1.0000 | PACK | Freq: Two times a day (BID) | ORAL | Status: DC
  Filled 2023-08-10 (×2): qty 1

## 2023-08-10 MED ORDER — FENTANYL CITRATE (PF) 100 MCG/2ML IJ SOLN
INTRAMUSCULAR | Status: AC
  Filled 2023-08-10: qty 2

## 2023-08-10 MED ORDER — FENTANYL CITRATE (PF) 100 MCG/2ML IJ SOLN
INTRAMUSCULAR | Status: AC | PRN
  Administered 2023-08-10: 50 ug via INTRAVENOUS

## 2023-08-10 MED ORDER — ACETAMINOPHEN 650 MG RE SUPP: 650.0000 mg | Freq: Four times a day (QID) | RECTAL | Status: DC | PRN

## 2023-08-10 MED ORDER — MIDAZOLAM HCL 2 MG/2ML IJ SOLN
INTRAMUSCULAR | Status: AC | PRN
  Administered 2023-08-10 (×2): 1 mg via INTRAVENOUS

## 2023-08-10 MED ORDER — TRAZODONE HCL 50 MG PO TABS
25.0000 mg | ORAL_TABLET | Freq: Every evening | ORAL | Status: DC | PRN
  Administered 2023-08-10: 25 mg
  Filled 2023-08-10: qty 1

## 2023-08-10 MED ORDER — APIXABAN 5 MG PO TABS: 5.0000 mg | ORAL_TABLET | Freq: Two times a day (BID) | ORAL | Status: DC

## 2023-08-10 MED ORDER — ACETAMINOPHEN 325 MG PO TABS
650.0000 mg | ORAL_TABLET | Freq: Four times a day (QID) | ORAL | Status: DC | PRN
  Administered 2023-08-10: 650 mg
  Filled 2023-08-10: qty 2

## 2023-08-10 MED ORDER — MIDAZOLAM HCL 2 MG/2ML IJ SOLN
INTRAMUSCULAR | Status: AC
  Filled 2023-08-10: qty 2

## 2023-08-10 MED ORDER — MORPHINE SULFATE (PF) 2 MG/ML IV SOLN
1.0000 mg | INTRAVENOUS | Status: DC | PRN
  Administered 2023-08-10 – 2023-08-11 (×3): 1 mg via INTRAVENOUS
  Filled 2023-08-10 (×3): qty 1

## 2023-08-10 MED ORDER — LIDOCAINE-EPINEPHRINE 1 %-1:100000 IJ SOLN
INTRAMUSCULAR | Status: AC
  Filled 2023-08-10: qty 1

## 2023-08-10 MED ORDER — IOHEXOL 300 MG/ML  SOLN: 50.0000 mL | Freq: Once | INTRAMUSCULAR | Status: DC | PRN

## 2023-08-10 MED ORDER — JEVITY 1.5 CAL/FIBER PO LIQD
355.0000 mL | Freq: Three times a day (TID) | ORAL | Status: DC
  Administered 2023-08-10: 120 mL
  Administered 2023-08-10: 180 mL
  Administered 2023-08-11: 240 mL
  Administered 2023-08-11: 300 mL
  Administered 2023-08-11: 355 mL
  Administered 2023-08-11: 240 mL
  Administered 2023-08-12 (×2): 355 mL
  Filled 2023-08-10 (×10): qty 474

## 2023-08-10 NOTE — Plan of Care (Signed)
   Problem: Coping: Goal: Ability to adjust to condition or change in health will improve Outcome: Progressing   Problem: Nutritional: Goal: Maintenance of adequate nutrition will improve Outcome: Progressing   Problem: Skin Integrity: Goal: Risk for impaired skin integrity will decrease Outcome: Progressing   Problem: Education: Goal: Knowledge of General Education information will improve Description: Including pain rating scale, medication(s)/side effects and non-pharmacologic comfort measures Outcome: Progressing

## 2023-08-10 NOTE — Progress Notes (Signed)
 PROGRESS NOTE    Bruce Franklin  YQM:[id number] DOB: December 27, 1945 DOA: 08/02/2023 PCP: [facility], [facility] Bruce Franklin is a 78 y.o. male with medical history significant for hypertension, hyperlipidemia, CAD, chronic HFmrEF, COPD, and diabetes mellitus who presents with productive cough, fatigue, and confusion. wife has become concerned due to the patient's worsening fatigue, confusion, and productive cough.  ED Course:  tachypnea, tachycardia, and stable BP.  Labs are most notable for glucosuria 33, WBC 15,700, lactic acid 2.1, and negative respiratory virus panel.  Chest x-ray is concerning for pneumonia. Blood cultures were collected and the patient was given a liter of saline, Rocephin , and azithromycin . - Suspected to be aspiration pneumonia, SLP eval noted severe dysphagia, placed on tube feeds via cortrak - Failed MBS again 4/28: Felt to be very high risk of aspiration, Dr. Farrel Franklin discussed with speech therapy and spouse, IR consulted for PEG tube   Subjective: -Very poor historian, mild cognitive deficits, tube feeds held, n.p.o. for PEG tube today   Assessment and Plan:  Sepsis, poa Aspiration pneumonia  -Treated with IV ceftriaxone  and azithromycin  initially -Remains on IV antibiotics as he failed swallow eval -I think he has completed 7 days of antibiotics, will discontinue after PEG tube placement tomorrow  Oropharyngeal dysphagia 4/26 failed MBS >cortrak placed for tube feeds 4-28- failed his MBS again, severe dysphagia, aspirating all liquids -Dr. Farrel Franklin discussed with speech therapy and patient's wife Bruce Franklin, after much discussion decision made for PEG tube placement -IR consulted  Rapid atrial fibrillation (HCC) -Had brief episodes of A-fib RVR this admission,  - Was switched over to oral Eliquis  and Toprol  and then back to IV Lopressor  on account of dysphagia and subcu Lovenox  while he was unable to tolerate p.o.  - Restart Eliquis  and Toprol  after PEG tube placement    DNR (do not resuscitate)/DNI(Do Not Intubate) - CODE STATUS changed after discussion with wife per Dr. Farrel Franklin  Chronic prescription opiate use 08-03-2023 goes to [facility] for opiate Rx.,  On oxycodone  10 Mg every 6 as needed at baseline - Currently on IV morphine  while n.p.o.  Chronic heart failure with mildly reduced ejection fraction  LVEF 50-55%. Pt will need f/u with cardiology as outpatient. Has remained stable from a CHF standpoint  CAD (coronary artery disease) Remains stable well, continue aspirin  COPD (chronic obstructive pulmonary disease) (HCC) Stable will continue as needed DuoNebs  Type 2 diabetes mellitus without complication (HCC) A1c is 9.4, CBGs mildly elevated, increase Lantus   I think his overall prognosis is poor and will benefit from palliative care eval at SNF  Pressure injury of skin Pressure Injury 08/07/23 Sacrum Medial Stage 1 -  Intact skin with non-blanchable redness of a localized area usually over a bony prominence. (Active)  08/07/23 1238  Location: Sacrum  Location Orientation: Medial  Staging: Stage 1 -  Intact skin with non-blanchable redness of a localized area usually over a bony prominence.  Wound Description (Comments):   Present on Admission:     DVT prophylaxis: SCDs   Code Status: Limited: Do not attempt resuscitation (DNR) -DNR-LIMITED -Do Not Intubate/DNI  Family Communication: No family at bedside, will update spouse Disposition Plan: SNF Reason for continuing need for hospitalization: awaiting G-tube placement. Awaiting SNF placement.  Objective: Vitals:   08/09/23 2310 08/10/23 0346 08/10/23 0827 08/10/23 0831  BP: (!) 140/93 (!) 161/88 (!) 137/95   Pulse: (!) 109 95    Resp: 18 20 (!) 24 17  Temp: 98.6 F (37  C) 98.1 F (36.7 C) 98.8 F (37.1 C)   TempSrc: Oral Oral Oral   SpO2: 94% 92% 94%   Weight:  61.8 kg    Height:        Intake/Output Summary (Last 24 hours) at 08/10/2023 0958 Last data filed  at 08/10/2023 0731 Gross per 24 hour  Intake --  Output 850 ml  Net -850 ml   Filed Weights   08/08/23 0606 08/09/23 0400 08/10/23 0346  Weight: 65.3 kg 63.2 kg 61.8 kg    Examination:  Physical Exam Vitals and nursing note reviewed.  Constitutional:      General: He is not in acute distress.    Appearance: He is not toxic-appearing.     Comments: Lethargic Chronically ill appearing  HENT:     Head: Normocephalic and atraumatic.     Nose:     Comments: +cortrak in nare Cardiovascular:     Rate and Rhythm: Normal rate and regular rhythm.  Pulmonary:     Effort: Pulmonary effort is normal. No respiratory distress.     Breath sounds: Normal breath sounds.  Abdominal:     General: Abdomen is flat. Bowel sounds are normal. There is no distension.  Skin:    General: Skin is warm and dry.     Capillary Refill: Capillary refill takes less than 2 seconds.  Neurological:     Comments: asleep    Data Reviewed: I have personally reviewed following labs and imaging studies  CBC: Recent Labs  Lab 08/04/23 0337 08/05/23 0402 08/06/23 0933 08/09/23 0031 08/10/23 0253  WBC 10.4 12.6* 9.3 11.2* 11.6*  NEUTROABS 7.8* 9.8* 7.3  --   --   HGB 16.2 16.2 16.3 15.6 15.1  HCT 46.9 47.2 48.3 45.5 45.5  MCV 91.6 90.4 89.8 90.5 92.3  PLT 213 235 257 268 278   Basic Metabolic Panel: Recent Labs  Lab 08/04/23 0337 08/05/23 0402 08/05/23 1717 08/06/23 0933 08/07/23 0304 08/08/23 0251 08/09/23 0257  NA 138 135  --  137 137  --   --   K 3.2* 4.0  --  3.7 3.9  --   --   CL 102 102  --  105 104  --   --   CO2 23 21*  --  20* 20*  --   --   GLUCOSE 231* 178*  --  207* 252*  --   --   BUN 9 9  --  13 17  --   --   CREATININE 0.72 0.77  --  0.75 0.67  --   --   CALCIUM  8.9 8.8*  --  9.5 9.6  --   --   MG 1.5* 2.0  --  1.9 1.9 2.0 2.0  PHOS  --   --  3.6 3.6 3.9 4.2  --    GFR: Estimated Creatinine Clearance: 67.6 mL/min (by C-G formula based on SCr of 0.67 mg/dL). Liver  Function Tests: No results for input(s): "AST", "ALT", "ALKPHOS", "BILITOT", "PROT", "ALBUMIN" in the last 168 hours.  CBG: Recent Labs  Lab 08/09/23 1557 08/09/23 2003 08/09/23 2309 08/10/23 0350 08/10/23 0913  GLUCAP 184* 207* 198* 206* 185*   Sepsis Labs: Recent Labs  Lab 08/04/23 0337  PROCALCITON 0.23    Recent Results (from the past 240 hours)  Resp panel by RT-PCR (RSV, Flu A&B, Covid) Anterior Nasal Swab     Status: None   Collection Time: 08/02/23 10:40 PM   Specimen: Anterior Nasal Swab  Result  Value Ref Range Status   SARS Coronavirus 2 by RT PCR NEGATIVE NEGATIVE Final   Influenza A by PCR NEGATIVE NEGATIVE Final   Influenza B by PCR NEGATIVE NEGATIVE Final    Comment: (NOTE) The Xpert Xpress SARS-CoV-2/FLU/RSV plus assay is intended as an aid in the diagnosis of influenza from Nasopharyngeal swab specimens and should not be used as a sole basis for treatment. Nasal washings and aspirates are unacceptable for Xpert Xpress SARS-CoV-2/FLU/RSV testing.  Fact Sheet for Patients: BloggerCourse.com  Fact Sheet for Healthcare Providers: SeriousBroker.it  This test is not yet approved or cleared by the [place]  FDA and has been authorized for detection and/or diagnosis of SARS-CoV-2 by FDA under an Emergency Use Authorization (EUA). This EUA will remain in effect (meaning this test can be used) for the duration of the COVID-19 declaration under Section 564(b)(1) of the Act, 21 U.S.C. section 360bbb-3(b)(1), unless the authorization is terminated or revoked.     Resp Syncytial Virus by PCR NEGATIVE NEGATIVE Final    Comment: (NOTE) Fact Sheet for Patients: BloggerCourse.com  Fact Sheet for Healthcare Providers: SeriousBroker.it  This test is not yet approved or cleared by the [place]  FDA and has been authorized for detection and/or diagnosis of  SARS-CoV-2 by FDA under an Emergency Use Authorization (EUA). This EUA will remain in effect (meaning this test can be used) for the duration of the COVID-19 declaration under Section 564(b)(1) of the Act, 21 U.S.C. section 360bbb-3(b)(1), unless the authorization is terminated or revoked.  Performed at [facility], [address] [place], [place] [address]   Blood culture (routine x 2)     Status: None   Collection Time: 08/02/23 11:36 PM   Specimen: BLOOD  Result Value Ref Range Status   Specimen Description BLOOD SITE NOT SPECIFIED  Final   Special Requests   Final    BOTTLES DRAWN AEROBIC AND ANAEROBIC Blood Culture adequate volume   Culture   Final    NO GROWTH 5 DAYS Performed at [facility], [address] [place], [place] [address]    Report Status 08/08/2023 FINAL  Final  Blood culture (routine x 2)     Status: None   Collection Time: 08/02/23 11:41 PM   Specimen: BLOOD  Result Value Ref Range Status   Specimen Description BLOOD BLOOD RIGHT FOREARM  Final   Special Requests   Final    BOTTLES DRAWN AEROBIC AND ANAEROBIC Blood Culture adequate volume   Culture   Final    NO GROWTH 5 DAYS Performed at [facility], [address] [place], [place] [address]    Report Status 08/08/2023 FINAL  Final     Radiology Studies: DG Abd 1 View Result Date: 08/10/2023 CLINICAL DATA:  78 year old male for gastrostomy tube placement. EXAM: ABDOMEN - 1 VIEW COMPARISON:  CT Abdomen and Pelvis 08/08/2023. FINDINGS: Portable AP supine view at 0558 hours. Feeding tube remains in place at the level of the proximal stomach. Retained barium type contrast throughout the large bowel, nonobstructed bowel gas pattern. Left upper quadrant contrast which based on the recent CT Abdomen and Pelvis appears to be in proximal small bowel diverticula rather than redundant splenic flexure. Stable lung bases. Stable cholecystectomy clips. Stable visualized osseous structures. Previous  lower lumbar fusion. IMPRESSION: 1. Stable Feeding tube tip at the proximal stomach. 2. Non obstructed bowel gas pattern. Oral contrast throughout the large bowel, and within what appear to be small bowel diverticula in the  left upper quadrant. Electronically Signed   By: Marlise Simpers M.D.   On: 08/10/2023 08:14   CT ABDOMEN PELVIS WO CONTRAST Result Date: 08/08/2023 CLINICAL DATA:  Preprocedural evaluation for gastrostomy tube EXAM: CT ABDOMEN AND PELVIS WITHOUT CONTRAST TECHNIQUE: Multidetector CT imaging of the abdomen and pelvis was performed following the standard protocol without IV contrast. RADIATION DOSE REDUCTION: This exam was performed according to the departmental dose-optimization program which includes automated exposure control, adjustment of the mA and/or kV according to patient size and/or use of iterative reconstruction technique. COMPARISON:  07/25/2019 FINDINGS: Lower chest: Patchy left lower lobe airspace disease may reflect infection or aspiration. No effusion. Hepatobiliary: Stable dilation the common bile duct measuring 10 mm. Pneumobilia identified consistent with prior sphincterotomy. High attenuation material within the downstream common bile duct could reflect small nonobstructing calculi versus refluxed barium from recent modified barium swallow. Otherwise unremarkable unenhanced appearance of the liver. Prior cholecystectomy. Pancreas: Unremarkable unenhanced appearance. Spleen: Unremarkable unenhanced appearance. Adrenals/Urinary Tract: No urinary tract calculi or obstructive uropathy within either kidney. The adrenals and bladder are unremarkable. Stomach/Bowel: Enteric catheter tip within the lumen of the gastric antrum. Barium within the stomach and proximal small bowel from previous modified barium swallow procedure. No bowel obstruction or ileus. No bowel wall thickening or inflammatory change. Vascular/Lymphatic: Aortic atherosclerosis. No enlarged abdominal or pelvic lymph nodes.  Reproductive: Prior prostatectomy. No abnormality within the surgical bed. Other: No free fluid or free intraperitoneal gas. No abdominal wall hernia. Musculoskeletal: No acute or destructive bony abnormalities. Postsurgical changes from L4-5 posterior fusion. The reconstructed images demonstrate no additional findings. IMPRESSION: 1. Stable common bile duct dilation, with pneumobilia consistent with prior sphincterotomy. High density material within the downstream common bile duct may reflect punctate nonobstructing calculi versus reflux barium after recent modified barium swallow procedure. 2. Patchy left lower lobe airspace disease consistent with infection or aspiration. 3.  Aortic Atherosclerosis (ICD10-I70.0). Electronically Signed   By: Bobbye Burrow M.D.   On: 08/08/2023 22:44    Scheduled Meds:  feeding supplement (PROSource TF20)  60 mL Per Tube Daily   fiber  1 packet Per Tube BID   free water  100 mL Per Tube Q6H   Gerhardt's butt cream   Topical BID   insulin  aspart  0-9 Units Subcutaneous QID   insulin  glargine-yfgn  10 Units Subcutaneous Daily   metoprolol  tartrate  5 mg Intravenous Q6H   multivitamin with minerals  1 tablet Per Tube Daily   sodium chloride  flush  3 mL Intravenous Q12H   thiamine   100 mg Per Tube Daily   Continuous Infusions:  azithromycin  500 mg (08/09/23 1155)    ceFAZolin  (ANCEF ) IV     cefTRIAXone  (ROCEPHIN )  IV 1 g (08/10/23 0929)   dextrose  5 % and 0.45 % NaCl 50 mL/hr at 08/10/23 0007     LOS: 7 days   Time spent: 45 minutes  Deforest Fast, MD  [facility]  08/10/2023, 9:58 AM

## 2023-08-10 NOTE — Procedures (Signed)
 Interventional Radiology Procedure Note  Procedure: Placement of percutaneous 50F balloon-retention gastrostomy tube.  Complications: None  EBL:  < 5 mL  Recommendations: - NPO for 4 hours after placement - Maintain G-tube to low wall suction for 4 hours after placement - May advance diet as tolerated and begin using tube 4 hours after placement - Gastropexy sutures are absorbable and do not require removal  Marliss Coots, MD Pager: [phone]

## 2023-08-10 NOTE — Progress Notes (Addendum)
 PHARMACY - ANTICOAGULATION CONSULT NOTE  Pharmacy Consult for apixaban  Indication: atrial fibrillation  Allergies  Allergen Reactions   Ibuprofen Hives and Rash   Sulfa Antibiotics Hives    "makes my tongue break out"    Patient Measurements: Height: 6\' 3"  (190.5 cm) Weight: 61.8 kg (136 lb 3.9 oz) IBW/kg (Calculated) : 84.5 HEPARIN  DW (KG): 66.5  Vital Signs: Temp: 97.8 F (36.6 C) (04/30 1248) Temp Source: Oral (04/30 1248) BP: 148/81 (04/30 1248) Pulse Rate: 99 (04/30 1248)  Labs: Recent Labs    08/09/23 0031 08/10/23 0253  HGB 15.6 15.1  HCT 45.5 45.5  PLT 268 278  LABPROT  --  14.2  INR  --  1.1    Estimated Creatinine Clearance: 67.6 mL/min (by C-G formula based on SCr of 0.67 mg/dL).   Medical History: Past Medical History:  Diagnosis Date   Acute biliary pancreatitis    Anxiety    Cholelithiasis    COPD (chronic obstructive pulmonary disease) (HCC)    Diabetes mellitus without complication (HCC)    Pneumonia    Prostate cancer (HCC)    Smokes 1 pack of cigarettes per day     Medications:  Medications Prior to Admission  Medication Sig Dispense Refill Last Dose/Taking   albuterol  (VENTOLIN  HFA) 108 (90 Base) MCG/ACT inhaler Inhale 2 puffs into the lungs every 6 (six) hours as needed for shortness of breath or wheezing.   Past Week   ALPRAZolam  (XANAX ) 0.5 MG tablet Take 0.5 mg by mouth 3 (three) times daily as needed for anxiety.   Past Week   aspirin EC 81 MG tablet Take 81 mg by mouth daily.   Past Week   gabapentin (NEURONTIN) 300 MG capsule Take 300 mg by mouth 2 (two) times daily as needed (pain).   Past Month   metFORMIN  (GLUCOPHAGE ) 1000 MG tablet Take 1,000 mg by mouth 2 (two) times daily.   08/02/2023   oxyCODONE -acetaminophen  (PERCOCET) 10-325 MG tablet Take 1 tablet by mouth 4 (four) times daily as needed for pain.   08/02/2023   OZEMPIC, 1 MG/DOSE, 4 MG/3ML SOPN Inject 1 mg into the skin once a week.   08/02/2023    Assessment: 78 y.o.  on Eliquis  for afib - last dose 4/25 ~0800. Pt has since failed MBS so speech recommending NPO. Changing Eliquis  to Lovenox  for now. CBC stable.   S/p PEG today. Ok to resume apixaban  in AM per Dr Drexel Gentles. CBC stable.   Goal of Therapy:  Monitor platelets by anticoagulation protocol: Yes   Plan:  Resume apixaban  5mg  BID Rx will monitor peripherally  Ivery Marking, PharmD, BCIDP, AAHIVP, CPP Infectious Disease Pharmacist 08/10/2023 4:51 PM

## 2023-08-10 NOTE — Progress Notes (Signed)
 Nutrition Follow-up  DOCUMENTATION CODES:   Not applicable  INTERVENTION:   Switch TF regimen to bolus feeds once PEG can be used.  355mL (1.5 cartons) of Jevity 1.5 QID (~6 cartons/day) Initiate feeds at 120 mL and increase by 60 mL every other feed until goal of 355 mL is reached.  Prosource TF20 60 ml daily. FWF 50 mL before and after feeds (400 mL daily) Provides 2213 kcal, 109 gm protein, 1480 ml free water daily  Continue MVI with minerals.   NUTRITION DIAGNOSIS:   Inadequate oral intake related to inability to eat as evidenced by NPO status. *still applicable  GOAL:   Patient will meet greater than or equal to 90% of their needs *met through TF  MONITOR:   TF tolerance, Diet advancement, Skin, Weight trends  REASON FOR ASSESSMENT:   Consult Enteral/tube feeding initiation and management, Assessment of nutrition requirement/status  ASSESSMENT:   Pt with PMH significant for HTN, HLD, CAD, HFrEF, COPD, T2DM. Presented with c/o cough, fatigue, confusion. Symptoms had been progressing over several days prior to admission. Found to be septic d/t PNA.  RD met with pt at bedside and pt's wife in room. Failed MBS again 4/28. Pt with PEG placement on this date. Fibersource was added BID on this date but messaged MD about changing formula to Jevity 1.5 so that formula has fiber. Likely d/c to SNF. Onset of Stage 1 sacral PI 4/27. Thiamine  d/c today.   Medications reviewed and include: SSI 0-9 units, IV antibiotics  Labs reviewed.    Intake/Output Summary (Last 24 hours) at 08/10/2023 1405 Last data filed at 08/10/2023 0731 Gross per 24 hour  Intake --  Output 850 ml  Net -850 ml    Weights reviewed. Admit weight 63.4 kg. Current weight 61.8 kg. Weight trending down.   NUTRITION - FOCUSED PHYSICAL EXAM:  Flowsheet Row Most Recent Value  Orbital Region Severe depletion  Upper Arm Region Severe depletion  Thoracic and Lumbar Region Moderate depletion  Buccal  Region Moderate depletion  Temple Region Severe depletion  Clavicle Bone Region Severe depletion  Clavicle and Acromion Bone Region Severe depletion  Scapular Bone Region Severe depletion  Dorsal Hand Severe depletion  Patellar Region Severe depletion  Anterior Thigh Region Severe depletion  Posterior Calf Region Severe depletion  Edema (RD Assessment) None  Hair Reviewed  Eyes Reviewed  Mouth Reviewed  Skin Reviewed  Nails Reviewed       Diet Order:   Diet Order             Diet NPO time specified  Diet effective now                   EDUCATION NEEDS:   Not appropriate for education at this time  Skin:  Skin Assessment: Reviewed RN Assessment  Last BM:  4/25  Height:   Ht Readings from Last 1 Encounters:  08/04/23 6\' 3"  (1.905 m)    Weight:   Wt Readings from Last 1 Encounters:  08/10/23 61.8 kg    Ideal Body Weight:  89.1 kg  BMI:  Body mass index is 17.03 kg/m.  Estimated Nutritional Needs:   Kcal:  2000-2300  Protein:  100-120  Fluid:  >1.8 L/day or per MD  Laren Player, MPH, RD, LDN Clinical Dietitian Contact information can be found at [facility].

## 2023-08-10 NOTE — Progress Notes (Signed)
 PT Cancellation Note  Patient Details Name: BOWMAN BLAZINA MRN: [id number] DOB: 1945-10-08   Cancelled Treatment:    Reason Eval/Treat Not Completed: Patient at procedure or test/unavailable (Transport present in pt's room to take him to IR for PEG tube. Will follow-up for PT treatment as schedule permits.)  Glenford Lanes, PT, DPT Acute Rehabilitation Services Office: [phone] Secure Chat Preferred  Riva Chester 08/10/2023, 10:37 AM

## 2023-08-10 NOTE — Care Management Important Message (Signed)
 Important Message  Patient Details  Name: Bruce Franklin MRN: [id number] Date of Birth: 27-Jun-1945   Important Message Given:  Yes - Medicare IM     [name] 08/10/2023, 10:28 AM

## 2023-08-10 NOTE — TOC Progression Note (Signed)
 Transition of Care [facility]) - Progression Note    Patient Details  Name: Bruce Franklin MRN: [id number] Date of Birth: March 10, 1946  Transition of Care [facility]) CM/SW Contact  Arron Big, [place] Phone Number: 08/10/2023, 3:56 PM  Clinical Narrative:   CSW submitted for insurance auth for [place]. Auth id [id number] Siegfried Dress is pending.   TOC will continue to follow.      Expected Discharge Plan: Skilled Nursing Facility Barriers to Discharge: Continued Medical Work up, English as a second language teacher  Expected Discharge Plan and Services In-house Referral: Clinical Social Work     Living arrangements for the past 2 months: Single Family Home                                       Social Determinants of Health (SDOH) Interventions SDOH Screenings   Food Insecurity: No Food Insecurity (08/04/2023)  Housing: High Risk (08/04/2023)  Transportation Needs: Unmet Transportation Needs (08/04/2023)  Utilities: At Risk (08/04/2023)  Social Connections: Socially Isolated (08/04/2023)  Tobacco Use: High Risk (08/03/2023)    Readmission Risk Interventions     No data to display

## 2023-08-10 NOTE — Progress Notes (Signed)
 Speech Language Pathology Treatment: Dysphagia  Patient Details Name: Bruce Franklin MRN: [id number] DOB: October 03, 1945 Today's Date: 08/10/2023 Time: 1191-4782 SLP Time Calculation (min) (ACUTE ONLY): 13 min  Assessment / Plan / Recommendation Clinical Impression  SLP conducted skilled therapy session targeting dysphagia goals. SLP targeted PO trials of puree. Per recommendations from MBS, patient partially reclined in bed and SLP facilitated oral care via suction toothbrush. Despite suctioning, demonstrated immediate coughing during oral care. With administration of purees, patient benefited from mod cues to maintain small bites and slow rate. After 5-7 bites, demonstrated delayed cough and endorsed globus sensation/feeling of residue in the throat. Recommend continuation of NPO, however with revalation of goals of care once obtained, patient may be appropriate for initiation of Dys1 diet with pudding thick liquids OR thin liquids if goals are purely comfort-based. Patient was left in room with call bell in reach and alarm set. SLP will continue to target goals per plan of care.     HPI HPI: Bruce Franklin is a 78 y.o. male with medical history significant for hypertension, hyperlipidemia, CAD, chronic HFmrEF, COPD, and diabetes mellitus who presents with productive cough, fatigue, and confusion. Dx with sepsis due to PNA. REcent admission 08/02/23 for PNA. Note from 4/25 am reports "Patient saturations 87-88% on 1LNC. RN increased patient's oxygen to [facility]. RN changed patient's gown and patient mentioned to RN "I have something in my throat". RN set up suction and suctioned oral cavity. A piece of sausage and egg was removed. RN made patient and MD aware of findings. Patient is NPO pending speech eval."      SLP Plan  Continue with current plan of care      Recommendations for follow up therapy are one component of a multi-disciplinary discharge planning process, led by the attending physician.   Recommendations may be updated based on patient status, additional functional criteria and insurance authorization.    Recommendations  Diet recommendations: NPO Medication Administration: Crushed with puree Compensations: Slow rate;Small sips/bites;Minimize environmental distractions Postural Changes and/or Swallow Maneuvers: Seated upright 90 degrees                  Oral care QID   Frequent or constant Supervision/Assistance Dysphagia, oropharyngeal phase (R13.12)     Continue with current plan of care    Dorla Gartner, M.A., CCC-SLP  [name] [name]  08/10/2023, 11:55 AM

## 2023-08-11 DIAGNOSIS — J189 Pneumonia, unspecified organism: Secondary | ICD-10-CM | POA: Diagnosis not present

## 2023-08-11 DIAGNOSIS — A419 Sepsis, unspecified organism: Secondary | ICD-10-CM | POA: Diagnosis not present

## 2023-08-11 LAB — GLUCOSE, CAPILLARY
Glucose-Capillary: 138 mg/dL — ABNORMAL HIGH (ref 70–99)
Glucose-Capillary: 134 mg/dL — ABNORMAL HIGH (ref 70–99)
Glucose-Capillary: 237 mg/dL — ABNORMAL HIGH (ref 70–99)
Glucose-Capillary: 177 mg/dL — ABNORMAL HIGH (ref 70–99)
Glucose-Capillary: 307 mg/dL — ABNORMAL HIGH (ref 70–99)

## 2023-08-11 LAB — BASIC METABOLIC PANEL WITH GFR
Anion gap: 12 (ref 5–15)
BUN: 17 mg/dL (ref 8–23)
CO2: 21 mmol/L — ABNORMAL LOW (ref 22–32)
Calcium: 9.2 mg/dL (ref 8.9–10.3)
Chloride: 105 mmol/L (ref 98–111)
Creatinine, Ser: 0.69 mg/dL (ref 0.61–1.24)
GFR, Estimated: >60 mL/min (ref >60)
Glucose, Bld: 144 mg/dL — ABNORMAL HIGH (ref 70–99)
Potassium: 4 mmol/L (ref 3.5–5.1)
Sodium: 138 mmol/L (ref 135–145)

## 2023-08-11 MED ORDER — SODIUM CHLORIDE 0.9% FLUSH: 10.0000 mL | INTRAVENOUS | Status: DC | PRN

## 2023-08-11 MED ORDER — OXYCODONE HCL 5 MG PO TABS
10.0000 mg | ORAL_TABLET | Freq: Four times a day (QID) | ORAL | Status: DC | PRN
  Administered 2023-08-11 – 2023-08-12 (×2): 10 mg
  Filled 2023-08-11 (×2): qty 2

## 2023-08-11 MED ORDER — APIXABAN 5 MG PO TABS
5.0000 mg | ORAL_TABLET | Freq: Two times a day (BID) | ORAL | Status: DC
  Administered 2023-08-11 – 2023-08-12 (×3): 5 mg
  Filled 2023-08-11 (×3): qty 1

## 2023-08-11 MED ORDER — METOPROLOL TARTRATE 25 MG PO TABS
25.0000 mg | ORAL_TABLET | Freq: Two times a day (BID) | ORAL | Status: DC
  Administered 2023-08-11 – 2023-08-12 (×3): 25 mg
  Filled 2023-08-11 (×3): qty 1

## 2023-08-11 NOTE — Progress Notes (Signed)
 Physical Therapy Treatment Patient Details Name: Bruce Franklin MRN: [id number] DOB: 15-Nov-1945 Today's Date: 08/11/2023   History of Present Illness Pt is 78 yo presenting to [facility] ED on 4/22 due to worsening cough, fatigue and confusion. Chest x-ray is concerning for pneumonia. PMH: HTN, hyperlipidemia, CAD, chronic HFmrEF, COPD, DM, ERCP    PT Comments  Pt is making steady progress towards his acute PT goals demonstrated by advanced functional mobility with decreased physical assistance. He completed bed mobility with minA, sit<>stand with min-modA using RW, and gait using RW with minA and a chair follow. He ambulated ~8ft with multiple gait abnormalities and was not receptive to multi-modal cueing to improve sequencing, positioning, and improve safety awareness. Pt is below his baseline function. PTA, he was mobilizing within his home without physical assistance and would alternate between no AD and using a walker when he was feeling more fatigued or weak. Patient will benefit from continued inpatient follow up therapy, <3 hours/day.    If plan is discharge home, recommend the following: Assist for transportation;Assistance with cooking/housework;Help with stairs or ramp for entrance;Two people to help with walking and/or transfers;A lot of help with bathing/dressing/bathroom   Can travel by private vehicle     Yes  Equipment Recommendations  Wheelchair (measurements PT);Wheelchair cushion (measurements PT)    Recommendations for Other Services       Precautions / Restrictions Precautions Precautions: Fall Recall of Precautions/Restrictions: Impaired Precaution/Restrictions Comments: PEG Required Braces or Orthoses: Other Brace Other Brace: Abdominal Binder Restrictions Weight Bearing Restrictions Per Provider Order: No     Mobility  Bed Mobility Overal bed mobility: Needs Assistance Bed Mobility: Rolling, Sidelying to Sit, Sit to Sidelying Rolling: Min assist, Used  rails Sidelying to sit: Min assist, HOB elevated, Used rails     Sit to sidelying: Min assist, HOB elevated General bed mobility comments: Pt sat up on R side of bed with increased time. VC for sequencing. He brought BLE off EOB and needed assist to elevate trunk and to help scoot fwd using bed pad. Pt returned to bed and needed assist bringing BLE into bed. Repositioned with +2 assit and use of bed features and bed pad.    Transfers Overall transfer level: Needs assistance Equipment used: Rolling walker (2 wheels) Transfers: Sit to/from Stand Sit to Stand: From elevated surface, Min assist, Mod assist           General transfer comment: Pt stood from slightly raised bed height with modA to power up. VC for proper hand positioning using RW required each time. Pt stood from chair with minA to power up, increased to modA as he fatigued. Good eccentric control with sitting.    Ambulation/Gait Ambulation/Gait assistance: Min assist, +2 safety/equipment (Chair Follow) Gait Distance (Feet): 40 Feet (1x30, seated rest 1x40) Assistive device: Rolling walker (2 wheels) Gait Pattern/deviations: Step-to pattern, Decreased step length - right, Decreased step length - left, Decreased dorsiflexion - right, Decreased dorsiflexion - left, Trunk flexed, Drifts right/left Gait velocity: decreased Gait velocity interpretation: <1.31 ft/sec, indicative of household ambulator   General Gait Details: Pt ambulated with short slow steps. Pt kept RW too far in front of him and maintain fwd flex posture. VC/TC for proper sequencing, correct positioning, and improve safety awareness using RW. Pt was unsteady, especially when turning. No LOB, but leaning and requiring cues to return to midline. PT facilitated seated rest break to allow pt to recover before returning to room.   Stairs  Wheelchair Mobility     Tilt Bed    Modified Rankin (Stroke Patients Only)       Balance Overall  balance assessment: Needs assistance Sitting-balance support: Bilateral upper extremity supported, Feet supported Sitting balance-Leahy Scale: Fair Sitting balance - Comments: Pt sat EOB with supervision-CGA for safety   Standing balance support: Bilateral upper extremity supported, During functional activity Standing balance-Leahy Scale: Poor Standing balance comment: Pt dependent on RW. He requires +2 assist for safety.                            Communication Communication Communication: Impaired Factors Affecting Communication: Reduced clarity of speech;Difficulty expressing self  Cognition Arousal: Alert Behavior During Therapy: WFL for tasks assessed/performed   PT - Cognitive impairments: Awareness, Safety/Judgement, Sequencing, Attention                       PT - Cognition Comments: Pt appeared to have delayed processing and took increased time to complete tasks. He required moderate encouragement and re-direction to focus on and fully participate with task. Following commands: Impaired Following commands impaired: Follows one step commands with increased time    Cueing Cueing Techniques: Verbal cues, Gestural cues, Tactile cues, Visual cues  Exercises      General Comments General comments (skin integrity, edema, etc.): Start of Session: BP 125/79 (92). End of Session: BP 133/68 (82). SpO2 >90% on RA. HR with activity 90-100bpm.      Pertinent Vitals/Pain Pain Assessment Pain Assessment: No/denies pain    Home Living                          Prior Function            PT Goals (current goals can now be found in the care plan section) Acute Rehab PT Goals Patient Stated Goal: Go to rehab Progress towards PT goals: Progressing toward goals    Frequency    Min 2X/week      PT Plan      Co-evaluation              AM-PAC PT "6 Clicks" Mobility   Outcome Measure  Help needed turning from your back to your side while  in a flat bed without using bedrails?: A Little Help needed moving from lying on your back to sitting on the side of a flat bed without using bedrails?: A Lot Help needed moving to and from a bed to a chair (including a wheelchair)?: A Lot Help needed standing up from a chair using your arms (e.g., wheelchair or bedside chair)?: A Lot Help needed to walk in hospital room?: Total Help needed climbing 3-5 steps with a railing? : Total 6 Click Score: 11    End of Session Equipment Utilized During Treatment: Gait belt Activity Tolerance: Patient tolerated treatment well;Patient limited by fatigue Patient left: in bed;with call bell/phone within reach;with bed alarm set Nurse Communication: Mobility status PT Visit Diagnosis: Other abnormalities of gait and mobility (R26.89);Unsteadiness on feet (R26.81);Muscle weakness (generalized) (M62.81)     Time: 1610-9604 PT Time Calculation (min) (ACUTE ONLY): 24 min  Charges:    $Gait Training: 8-22 mins $Therapeutic Activity: 8-22 mins PT General Charges $$ ACUTE PT VISIT: 1 Visit                     Glenford Lanes, PT, DPT Acute Rehabilitation Services  Office: [phone] Secure Chat Preferred  Riva Chester 08/11/2023, 2:46 PM

## 2023-08-11 NOTE — TOC Progression Note (Addendum)
 Transition of Care [facility]) - Progression Note    Patient Details  Name: [name] MRN: [id number] Date of Birth: 1946-01-30  [facility] CM/SW Contact  Arron Big, [place] Phone Number: 08/11/2023, 8:56 AM  Clinical Narrative:   Insurance auth still pending at this time.   11:43 AM Insurance requiring updated clinicals containing prior level of function from PT and how well patient is tolerating g-tube. CSW notified treatment team.   3:20 PM CSW called [employer] to request an extension for requested clinicals as the requested note regarding patient tolerating tube feeds will not be in until later tonight per bedside RN. Per [employer], they need an MD note stating tube placement was successful, which CSW has. CSW provided updated clinicals to insurance per request.   TOC will continue to follow.      Expected Discharge Plan: Skilled Nursing Facility Barriers to Discharge: Continued Medical Work up, English as a second language teacher  Expected Discharge Plan and Services In-house Referral: Clinical Social Work     Living arrangements for the past 2 months: Single Family Home                                       Social Determinants of Health (SDOH) Interventions SDOH Screenings   Food Insecurity: No Food Insecurity (08/04/2023)  Housing: High Risk (08/04/2023)  Transportation Needs: Unmet Transportation Needs (08/04/2023)  Utilities: At Risk (08/04/2023)  Social Connections: Socially Isolated (08/04/2023)  Tobacco Use: High Risk (08/03/2023)    Readmission Risk Interventions     No data to display

## 2023-08-11 NOTE — Inpatient Diabetes Management (Signed)
 Inpatient Diabetes Program Recommendations  AACE/ADA: New Consensus Statement on Inpatient Glycemic Control (2015)  Target Ranges:  Prepandial:   less than 140 mg/dL      Peak postprandial:   less than 180 mg/dL (1-2 hours)      Critically ill patients:  140 - 180 mg/dL   Lab Results  Component Value Date   GLUCAP 237 (H) 08/11/2023   HGBA1C 9.4 (H) 08/03/2023    Review of Glycemic Control  Latest Reference Range & Units 08/10/23 16:39 08/10/23 19:59 08/10/23 23:56 08/11/23 03:51 08/11/23 07:02 08/11/23 12:09  Glucose-Capillary 70 - 99 mg/dL 161 (H) 096 (H) 045 (H) 138 (H) 134 (H) 237 (H)   Diabetes history: DM  Outpatient Diabetes medications:  Metformin  1000 mg bid Ozempic 1 mg daily Current orders for Inpatient glycemic control:  Novolog  0-9 units 4 times daily Jevity 355 ml tid with meals and at bedtime (0700, 1100, 1600, and 2000)  Inpatient Diabetes Program Recommendations:    If blood sugars>goal with tube feed bolus, may consider adding Novolog  2 units with tube feed boluses.   Thanks,  Josefa Ni, RN, BC-ADM Inpatient Diabetes Coordinator Pager [phone]  (8a-5p)

## 2023-08-11 NOTE — Plan of Care (Signed)
  Problem: Tissue Perfusion: Goal: Adequacy of tissue perfusion will improve Outcome: Progressing   Problem: Clinical Measurements: Goal: Will remain free from infection Outcome: Progressing   Problem: Elimination: Goal: Will not experience complications related to urinary retention Outcome: Progressing   Problem: Safety: Goal: Ability to remain free from injury will improve Outcome: Progressing   Problem: Education: Goal: Ability to describe self-care measures that may prevent or decrease complications (Diabetes Survival Skills Education) will improve Outcome: Not Progressing   Problem: Coping: Goal: Ability to adjust to condition or change in health will improve Outcome: Not Progressing   Problem: Education: Goal: Knowledge of General Education information will improve Description: Including pain rating scale, medication(s)/side effects and non-pharmacologic comfort measures Outcome: Not Progressing

## 2023-08-11 NOTE — Progress Notes (Signed)
 Patient kept on trying to get out of bed and pulling out Cortrak, attempting to kick this nurse when trying to calm him down and repositioned on bed. As needed Haldol  2mg  IM given, safety precautions and fall preventive measures maintained. Observed patient behavior and kept safe and comfortable.

## 2023-08-11 NOTE — Plan of Care (Signed)
 Alert, oriented to self.  Small bore feeding tube to right nare removed per MD order.  Tolerating tube feedings via PEG tube with no cramping, distention, or tenderness. Will be at goal tube feed with the 5/1 2000 dose.  Family came to bedside to visit.   Problem: Education: Goal: Ability to describe self-care measures that may prevent or decrease complications (Diabetes Survival Skills Education) will improve 08/11/2023 1905 by Delmus Ferri, RN Outcome: Progressing 08/11/2023 1905 by Delmus Ferri, RN Outcome: Not Progressing Goal: Individualized Educational Video(s) 08/11/2023 1905 by Delmus Ferri, RN Outcome: Progressing 08/11/2023 1905 by Delmus Ferri, RN Outcome: Progressing   Problem: Coping: Goal: Ability to adjust to condition or change in health will improve 08/11/2023 1905 by Delmus Ferri, RN Outcome: Progressing 08/11/2023 1905 by Delmus Ferri, RN Outcome: Not Progressing   Problem: Fluid Volume: Goal: Ability to maintain a balanced intake and output will improve 08/11/2023 1905 by Delmus Ferri, RN Outcome: Progressing 08/11/2023 1905 by Delmus Ferri, RN Outcome: Progressing   Problem: Health Behavior/Discharge Planning: Goal: Ability to identify and utilize available resources and services will improve Outcome: Progressing   Problem: Nutritional: Goal: Maintenance of adequate nutrition will improve Outcome: Progressing Goal: Progress toward achieving an optimal weight will improve Outcome: Progressing   Problem: Skin Integrity: Goal: Risk for impaired skin integrity will decrease Outcome: Progressing

## 2023-08-11 NOTE — Progress Notes (Signed)
 PROGRESS NOTE    NORMAN MONOHAN  ZOX:[id number] DOB: 04-26-1945 DOA: 08/02/2023 PCP: [facility] COBURN LENGER is a 78 y.o. male with medical history significant for hypertension, hyperlipidemia, CAD, chronic HFmrEF, COPD, and diabetes mellitus who presents with productive cough, fatigue, and confusion. wife has become concerned due to the patient's worsening fatigue, confusion, and productive cough.  ED Course:  tachypnea, tachycardia, and stable BP.  Labs are most notable for glucosuria 33, WBC 15,700, lactic acid 2.1, and negative respiratory virus panel.  Chest x-ray is concerning for pneumonia. Blood cultures were collected and the patient was given a liter of saline, Rocephin , and azithromycin . - Suspected to be aspiration pneumonia, SLP eval noted severe dysphagia, placed on tube feeds via cortrak - Failed MBS again 4/28: Felt to be very high risk of aspiration, Dr. Farrel Hones discussed with speech therapy and spouse, IR consulted for PEG tube - 4/30, PEG tube was placed   Subjective: - Starting tube feeds via PEG, drowsy this morning, apparently just got a dose of IV morphine   Assessment and Plan:  Sepsis, poa Aspiration pneumonia  -Treated with IV ceftriaxone  and azithromycin  initially -Remained on IV antibiotics as he failed swallow eval - He has added 7-8 days of ABX, will discontinue today  Oropharyngeal dysphagia 4/26 failed MBS >cortrak placed for tube feeds 4-28- failed his MBS again, severe dysphagia, aspirating all liquids -Dr. Farrel Hones discussed with speech therapy and patient's wife [name], after much discussion decision made for PEG tube placement -IR consulted - PEG tube placed yesterday, >TFs> increased to goals - Will DC core track  Rapid atrial fibrillation (HCC) -Had brief episodes of A-fib RVR this admission,  - Was switched over to oral Eliquis  and Toprol  and then back to IV Lopressor  on account of dysphagia and subcu Lovenox  while he was unable to  tolerate p.o.  - Restart Eliquis  today and metoprolol  via PEG  DNR (do not resuscitate)/DNI(Do Not Intubate) - CODE STATUS changed after discussion with wife per Dr. Farrel Hones  Chronic prescription opiate use 08-03-2023 goes to [facility] for opiate Rx.,  On oxycodone  10 Mg every 6 as needed at baseline - Discontinue morphine , restart home regimen of oxycodone   Chronic heart failure with mildly reduced ejection fraction  LVEF 50-55%. Pt will need f/u with cardiology as outpatient. Has remained stable from a CHF standpoint  CAD (coronary artery disease) Remains stable well, continue aspirin  COPD (chronic obstructive pulmonary disease) (HCC) Stable will continue as needed DuoNebs  Type 2 diabetes mellitus without complication (HCC) A1c is 9.4, CBGs mildly elevated, increase Lantus   I think his overall prognosis is poor and will benefit from palliative care eval at SNF  Pressure injury of skin Pressure Injury 08/07/23 Sacrum Medial Stage 1 -  Intact skin with non-blanchable redness of a localized area usually over a bony prominence. (Active)  08/07/23 1238  Location: Sacrum  Location Orientation: Medial  Staging: Stage 1 -  Intact skin with non-blanchable redness of a localized area usually over a bony prominence.  Wound Description (Comments):   Present on Admission:     DVT prophylaxis: SCDs   Code Status: Limited: Do not attempt resuscitation (DNR) -DNR-LIMITED -Do Not Intubate/DNI  Family Communication: No family at bedside, will update spouse Disposition Plan: SNF likely tomorrow  Objective: Vitals:   08/10/23 1945 08/10/23 2315 08/11/23 0357 08/11/23 0716  BP: (!) 151/87 (!) 141/79 (!) 146/81 (!) 148/81  Pulse: 95 98 89 86  Resp: (!) 23 (!) 21  20 (!) 21  Temp: 98.8 F (37.1 C)  98.8 F (37.1 C) 97.8 F (36.6 C)  TempSrc: Oral  Oral Oral  SpO2: 90% 93% 92% 91%  Weight:   63.7 kg   Height:   6\' 3"  (1.905 m)     Intake/Output Summary (Last 24 hours) at  08/11/2023 1017 Last data filed at 08/11/2023 0901 Gross per 24 hour  Intake 183 ml  Output 550 ml  Net -367 ml   Filed Weights   08/09/23 0400 08/10/23 0346 08/11/23 0357  Weight: 63.2 kg 61.8 kg 63.7 kg    Examination:  Physical Exam Vitals and nursing note reviewed.  Constitutional:      General: He is not in acute distress.    Appearance: He is not toxic-appearing.     Comments: Lethargic Chronically ill appearing  HENT:     Head: Normocephalic and atraumatic.     Nose:     Comments: +cortrak in nare Cardiovascular:     Rate and Rhythm: Normal rate and regular rhythm.  Pulmonary:     Effort: Pulmonary effort is normal. No respiratory distress.     Breath sounds: Normal breath sounds.  Abdominal:     General: Abdomen is flat. Bowel sounds are normal. There is no distension.  Skin:    General: Skin is warm and dry.     Capillary Refill: Capillary refill takes less than 2 seconds.  Neurological:     Comments: asleep    Data Reviewed: I have personally reviewed following labs and imaging studies  CBC: Recent Labs  Lab 08/05/23 0402 08/06/23 0933 08/09/23 0031 08/10/23 0253  WBC 12.6* 9.3 11.2* 11.6*  NEUTROABS 9.8* 7.3  --   --   HGB 16.2 16.3 15.6 15.1  HCT 47.2 48.3 45.5 45.5  MCV 90.4 89.8 90.5 92.3  PLT 235 257 268 278   Basic Metabolic Panel: Recent Labs  Lab 08/05/23 0402 08/05/23 1717 08/06/23 0933 08/07/23 0304 08/08/23 0251 08/09/23 0257 08/11/23 0304  NA 135  --  137 137  --   --  138  K 4.0  --  3.7 3.9  --   --  4.0  CL 102  --  105 104  --   --  105  CO2 21*  --  20* 20*  --   --  21*  GLUCOSE 178*  --  207* 252*  --   --  144*  BUN 9  --  13 17  --   --  17  CREATININE 0.77  --  0.75 0.67  --   --  0.69  CALCIUM  8.8*  --  9.5 9.6  --   --  9.2  MG 2.0  --  1.9 1.9 2.0 2.0  --   PHOS  --  3.6 3.6 3.9 4.2  --   --    GFR: Estimated Creatinine Clearance: 69.7 mL/min (by C-G formula based on SCr of 0.69 mg/dL). Liver Function  Tests: No results for input(s): "AST", "ALT", "ALKPHOS", "BILITOT", "PROT", "ALBUMIN" in the last 168 hours.  CBG: Recent Labs  Lab 08/10/23 1639 08/10/23 1959 08/10/23 2356 08/11/23 0351 08/11/23 0702  GLUCAP 182* 211* 189* 138* 134*   Sepsis Labs: No results for input(s): "PROCALCITON", "LATICACIDVEN" in the last 168 hours.   Recent Results (from the past 240 hours)  Resp panel by RT-PCR (RSV, Flu A&B, Covid) Anterior Nasal Swab     Status: None   Collection Time: 08/02/23 10:40 PM  Specimen: Anterior Nasal Swab  Result Value Ref Range Status   SARS Coronavirus 2 by RT PCR NEGATIVE NEGATIVE Final   Influenza A by PCR NEGATIVE NEGATIVE Final   Influenza B by PCR NEGATIVE NEGATIVE Final    Comment: (NOTE) The Xpert Xpress SARS-CoV-2/FLU/RSV plus assay is intended as an aid in the diagnosis of influenza from Nasopharyngeal swab specimens and should not be used as a sole basis for treatment. Nasal washings and aspirates are unacceptable for Xpert Xpress SARS-CoV-2/FLU/RSV testing.  Fact Sheet for Patients: BloggerCourse.com  Fact Sheet for Healthcare Providers: SeriousBroker.it  This test is not yet approved or cleared by the [place]  FDA and has been authorized for detection and/or diagnosis of SARS-CoV-2 by FDA under an Emergency Use Authorization (EUA). This EUA will remain in effect (meaning this test can be used) for the duration of the COVID-19 declaration under Section 564(b)(1) of the Act, 21 U.S.C. section 360bbb-3(b)(1), unless the authorization is terminated or revoked.     Resp Syncytial Virus by PCR NEGATIVE NEGATIVE Final    Comment: (NOTE) Fact Sheet for Patients: BloggerCourse.com  Fact Sheet for Healthcare Providers: SeriousBroker.it  This test is not yet approved or cleared by the [place]  FDA and has been authorized for detection and/or  diagnosis of SARS-CoV-2 by FDA under an Emergency Use Authorization (EUA). This EUA will remain in effect (meaning this test can be used) for the duration of the COVID-19 declaration under Section 564(b)(1) of the Act, 21 U.S.C. section 360bbb-3(b)(1), unless the authorization is terminated or revoked.  Performed at [facility], [address] [place], [place] [address]   Blood culture (routine x 2)     Status: None   Collection Time: 08/02/23 11:36 PM   Specimen: BLOOD  Result Value Ref Range Status   Specimen Description BLOOD SITE NOT SPECIFIED  Final   Special Requests   Final    BOTTLES DRAWN AEROBIC AND ANAEROBIC Blood Culture adequate volume   Culture   Final    NO GROWTH 5 DAYS Performed at [facility], [address] [place], [place] [address]    Report Status 08/08/2023 FINAL  Final  Blood culture (routine x 2)     Status: None   Collection Time: 08/02/23 11:41 PM   Specimen: BLOOD  Result Value Ref Range Status   Specimen Description BLOOD BLOOD RIGHT FOREARM  Final   Special Requests   Final    BOTTLES DRAWN AEROBIC AND ANAEROBIC Blood Culture adequate volume   Culture   Final    NO GROWTH 5 DAYS Performed at [facility], [address], [place], [place] [address]    Report Status 08/08/2023 FINAL  Final     Radiology Studies: IR GASTROSTOMY TUBE MOD SED Result Date: 08/10/2023 INDICATION: 78 year old male with history of severe dysphagia requiring percutaneous enteric access for supplemental nutrition. EXAM: PERC PLACEMENT GASTROSTOMY MEDICATIONS: None. ANESTHESIA/SEDATION: Versed  2 mg IV; Fentanyl  50 mcg IV Moderate Sedation Time:  10 The patient was continuously monitored during the procedure by the interventional radiology nurse under my direct supervision. CONTRAST:  10 mL Omnipaque  300-administered into the gastric lumen. FLUOROSCOPY TIME:  Three mGy COMPLICATIONS: None immediate. PROCEDURE: Informed written consent was obtained from the  patient after a thorough discussion of the procedural risks, benefits and alternatives. All questions were addressed. Maximal Sterile barrier Technique was utilized including caps, mask, sterile gowns, sterile gloves, sterile drape, hand hygiene and skin antiseptic. A timeout was performed prior  to the initiation of the procedure. The patient was placed on the procedure table in the supine position. Pre-procedure abdominal film confirmed visualization of the transverse colon. The patient was prepped and draped in usual sterile fashion. The stomach was insufflated with air via the indwelling nasogastric tube. Under fluoroscopy, a puncture site was selected and local analgesia achieved with 1% lidocaine  infiltrated subcutaneously. Under fluoroscopic guidance, a gastropexy needle was passed into the stomach and the T-bar suture was released. Entry into the stomach was confirmed with fluoroscopy, aspiration of air, and injection of contrast material. This was repeated with an additional gastropexy suture (for a total of 2 fasteners). At the center of these gastropexy sutures, a dermatotomy was performed. An 18 gauge needle was passed into the stomach at the site of this dermatotomy, and position within the gastric lumen again confirmed under fluoroscopy using aspiration of air and contrast injection. An Amplatz guidewire was passed through this needle and intraluminal placement within the stomach was confirmed by fluoroscopy. The needle was removed. Over the guidewire, the percutaneous tract was dilated using a 10 mm non-compliant balloon. The balloon was deflated, then pushed into the gastric lumen followed in concert by the 20 Fr gastrostomy tube. The retention balloon of the percutaneous gastrostomy tube was inflated with 20 mL of sterile water . The tube was withdrawn until the retention balloon was at the edge of the gastric lumen. The external bumper was brought to the abdominal wall. Contrast was injected through  the gastrostomy tube, confirming intraluminal positioning. The patient tolerated the procedure well without any immediate post-procedural complications. IMPRESSION: Technically successful placement of 20 Fr gastrostomy tube. Creasie Doctor, MD [facility] Electronically Signed   By: Creasie Doctor M.D.   On: 08/10/2023 14:43   DG Abd 1 View Result Date: 08/10/2023 CLINICAL DATA:  78 year old male for gastrostomy tube placement. EXAM: ABDOMEN - 1 VIEW COMPARISON:  CT Abdomen and Pelvis 08/08/2023. FINDINGS: Portable AP supine view at 0558 hours. Feeding tube remains in place at the level of the proximal stomach. Retained barium type contrast throughout the large bowel, nonobstructed bowel gas pattern. Left upper quadrant contrast which based on the recent CT Abdomen and Pelvis appears to be in proximal small bowel diverticula rather than redundant splenic flexure. Stable lung bases. Stable cholecystectomy clips. Stable visualized osseous structures. Previous lower lumbar fusion. IMPRESSION: 1. Stable Feeding tube tip at the proximal stomach. 2. Non obstructed bowel gas pattern. Oral contrast throughout the large bowel, and within what appear to be small bowel diverticula in the left upper quadrant. Electronically Signed   By: Marlise Simpers M.D.   On: 08/10/2023 08:14    Scheduled Meds:  apixaban   5 mg Per Tube BID   feeding supplement (JEVITY 1.5 CAL/FIBER)  355 mL Per Tube TID WC & HS   feeding supplement (PROSource TF20)  60 mL Per Tube Daily   free water   100 mL Per Tube Q6H   Gerhardt's butt cream   Topical BID   insulin  aspart  0-9 Units Subcutaneous QID   insulin  glargine-yfgn  10 Units Subcutaneous Daily   metoprolol  tartrate  25 mg Per Tube BID   multivitamin with minerals  1 tablet Per Tube Daily   sodium chloride  flush  3 mL Intravenous Q12H   thiamine   100 mg Per Tube Daily   Continuous Infusions:     LOS: 8 days   Time spent: 45  minutes  Deforest Fast, MD  [facility]  08/11/2023, 10:17 AM

## 2023-08-12 DIAGNOSIS — A419 Sepsis, unspecified organism: Secondary | ICD-10-CM | POA: Diagnosis not present

## 2023-08-12 DIAGNOSIS — J189 Pneumonia, unspecified organism: Secondary | ICD-10-CM | POA: Diagnosis not present

## 2023-08-12 LAB — GLUCOSE, CAPILLARY
Glucose-Capillary: 228 mg/dL — ABNORMAL HIGH (ref 70–99)
Glucose-Capillary: 169 mg/dL — ABNORMAL HIGH (ref 70–99)
Glucose-Capillary: 251 mg/dL — ABNORMAL HIGH (ref 70–99)

## 2023-08-12 LAB — CBC
HCT: 44.2 % (ref 39.0–52.0)
Hemoglobin: 14.8 g/dL (ref 13.0–17.0)
MCH: 31 pg (ref 26.0–34.0)
MCHC: 33.5 g/dL (ref 30.0–36.0)
MCV: 92.5 fL (ref 80.0–100.0)
Platelets: 273 10*3/uL (ref 150–400)
RBC: 4.78 MIL/uL (ref 4.22–5.81)
RDW: 13.2 % (ref 11.5–15.5)
WBC: 9.9 10*3/uL (ref 4.0–10.5)
nRBC: 0 % (ref 0.0–0.2)

## 2023-08-12 LAB — BASIC METABOLIC PANEL WITH GFR
Anion gap: 9 (ref 5–15)
BUN: 23 mg/dL (ref 8–23)
CO2: 24 mmol/L (ref 22–32)
Calcium: 9 mg/dL (ref 8.9–10.3)
Chloride: 105 mmol/L (ref 98–111)
Creatinine, Ser: 0.79 mg/dL (ref 0.61–1.24)
GFR, Estimated: >60 mL/min (ref >60)
Glucose, Bld: 164 mg/dL — ABNORMAL HIGH (ref 70–99)
Potassium: 4 mmol/L (ref 3.5–5.1)
Sodium: 138 mmol/L (ref 135–145)

## 2023-08-12 MED ORDER — PROSOURCE TF20 ENFIT COMPATIBL EN LIQD: 60.0000 mL | Freq: Every day | ENTERAL | Status: DC

## 2023-08-12 MED ORDER — ADULT MULTIVITAMIN W/MINERALS CH: 1.0000 | ORAL_TABLET | Freq: Every day | ORAL | Status: DC

## 2023-08-12 MED ORDER — JEVITY 1.5 CAL/FIBER PO LIQD: 355.0000 mL | Freq: Three times a day (TID) | ORAL | Status: AC

## 2023-08-12 MED ORDER — GERHARDT'S BUTT CREAM: 1.0000 | TOPICAL_CREAM | Freq: Two times a day (BID) | CUTANEOUS | Status: DC

## 2023-08-12 MED ORDER — TRAZODONE HCL 50 MG PO TABS: 25.0000 mg | ORAL_TABLET | Freq: Every evening | ORAL | Status: DC | PRN

## 2023-08-12 MED ORDER — ONDANSETRON HCL 4 MG PO TABS: 4.0000 mg | ORAL_TABLET | Freq: Four times a day (QID) | ORAL | Status: DC | PRN

## 2023-08-12 MED ORDER — APIXABAN 5 MG PO TABS: 5.0000 mg | ORAL_TABLET | Freq: Two times a day (BID) | ORAL | Status: DC

## 2023-08-12 MED ORDER — ALPRAZOLAM 0.5 MG PO TABS: 0.5000 mg | ORAL_TABLET | Freq: Three times a day (TID) | ORAL | 0 refills | Status: AC | PRN

## 2023-08-12 MED ORDER — OXYCODONE HCL 10 MG PO TABS: 10.0000 mg | ORAL_TABLET | Freq: Four times a day (QID) | ORAL | 0 refills | Status: AC | PRN

## 2023-08-12 MED ORDER — INSULIN GLARGINE-YFGN 100 UNIT/ML ~~LOC~~ SOLN: 10.0000 [IU] | Freq: Every day | SUBCUTANEOUS | Status: DC

## 2023-08-12 MED ORDER — GUAIFENESIN 100 MG/5ML PO LIQD: 5.0000 mL | ORAL | Status: DC | PRN

## 2023-08-12 MED ORDER — FREE WATER: 100.0000 mL | Freq: Four times a day (QID) | Status: DC

## 2023-08-12 MED ORDER — METOPROLOL TARTRATE 25 MG PO TABS: 25.0000 mg | ORAL_TABLET | Freq: Two times a day (BID) | ORAL | Status: DC

## 2023-08-12 MED ORDER — INSULIN ASPART 100 UNIT/ML IJ SOLN: 0.0000 [IU] | Freq: Four times a day (QID) | INTRAMUSCULAR | Status: DC

## 2023-08-12 NOTE — TOC Transition Note (Signed)
 Transition of Care [facility]) - Discharge Note   Patient Details  Name: Bruce Franklin MRN: [id number] Date of Birth: 01-31-1946  Transition of Care [facility]) CM/SW Contact:  Arron Big, LCSWA Phone Number: 08/12/2023, 1:18 PM   Clinical Narrative:   Patient will DC to: [facility] Anticipated DC date: 08/12/23 Family notified: [name] (spouse) Transport by: Lyna Sandhoff    Per MD patient ready for DC to [facility] RN to call report prior to discharge [phone]; room 115 ). RN, patient, patient's family, and facility notified of DC. Discharge Summary and FL2 sent to facility. DC packet on chart. Ambulance transport requested for patient 1:18PM.   CSW will sign off for now as social work intervention is no longer needed. Please consult us  again if new needs arise.     Final next level of care: Skilled Nursing Facility Barriers to Discharge: Barriers Resolved   Patient Goals and CMS Choice Patient states their goals for this hospitalization and ongoing recovery are:: To go home          Discharge Placement              Patient chooses bed at: [facility] Patient to be transferred to facility by: [facility] Name of family member notified: [name] (spouse) Patient and family notified of of transfer: 08/12/23  Discharge Plan and Services Additional resources added to the After Visit Summary for   In-house Referral: Clinical Social Work                                   Social Drivers of Health (SDOH) Interventions SDOH Screenings   Food Insecurity: No Food Insecurity (08/04/2023)  Housing: High Risk (08/04/2023)  Transportation Needs: Unmet Transportation Needs (08/04/2023)  Utilities: At Risk (08/04/2023)  Social Connections: Socially Isolated (08/04/2023)  Tobacco Use: High Risk (08/03/2023)     Readmission Risk Interventions     No data to display

## 2023-08-12 NOTE — Discharge Summary (Addendum)
 Physician Discharge Summary  Bruce Franklin ZOX:[id number] DOB: April 20, 1945 DOA: 08/02/2023  PCP: [facility]  Admit date: 08/02/2023 Discharge date: 08/12/2023  Admitted From: Home  Discharge disposition: Skilled nursing facility  Recommendations for Outpatient Follow-Up:   Follow up with your primary care provider in one week.  Check CBC, BMP, magnesium  in the next visit Follow-up with cardiology as outpatient. Patient might benefit from palliative care involvement at the skilled nursing facility [facility] to start prosource supplement when available.  Discharge Diagnosis:   Principal Problem:   Sepsis due to pneumonia [facility]) Active Problems:   Rapid atrial fibrillation (HCC)   Oropharyngeal dysphagia   Type 2 diabetes mellitus without complication (HCC)   COPD (chronic obstructive pulmonary disease) (HCC)   CAD (coronary artery disease)   Chronic heart failure with mildly reduced ejection fraction (HFmrEF, 41-49%) (HCC)   Chronic prescription opiate use   DNR (do not resuscitate)/DNI(Do Not Intubate)   Pressure injury of skin   Discharge Condition: Improved.  Diet recommendation: NPO, tube feedings  Wound care: None.  Code status: DNR   History of Present Illness:   Bruce Franklin is a 78 y.o. male with past medical history significant for hypertension, hyperlipidemia, CAD, chronic HFmrEF, COPD, and diabetes mellitus presented to hospital with cough fatigue and confusion.  In the ED patient was tachypneic tachycardic and had leukocytosis with elevated lactate of 2.1.  Respiratory viral panel was negative.  Chest x-ray showed pneumonia.  Thought to have aspiration pneumonia and speech therapy had seen the patient with severe dysphagia.  Has was then started on tube feeds with cortrak tube.  Patient failed MBS again 4/28: Felt to be very high risk of aspiration, IR was consulted for PEG tube placement and underwent PEG tube placement 08/10/2023.    Hospital Course:    Following conditions were addressed during hospitalization as listed below,  Sepsis on presentation secondary to Aspiration pneumonia  Has completed course of Rocephin  and Zithromax  for 7 days or so.  No signs of sepsis at this time.  High risk of aspiration.   Oropharyngeal dysphagia 4/26 failed MBS >cortrak placed for tube feeds.  Patient again underwent modified barium swallow on 4-28- failed, severe dysphagia, aspirating all liquids.  Patient underwent a PEG tube placement by IR at this time and has been advanced on tube feeds.  Continue tube feeding on discharge.   Atrial fibrillation with RVR. Brief episodes during hospitalization.  Continue Eliquis  and metoprolol  via PEG tube.   Chronic prescription opiate use   On oxycodone  10 Mg every 6 as needed at baseline.  Has been restarted on oxycodone  while in the hospital.  Will continue on discharge.  Chronic heart failure with mildly reduced ejection fraction  LVEF 50-55%.  Compensated patient will need to follow-up with cardiology as outpatient.  CAD (coronary artery disease) Patient has been started on Eliquis .  Continue aspirin.   COPD (chronic obstructive pulmonary disease)  Continue albuterol  from home.   Type 2 diabetes mellitus without complication Recent hemoglobin A1c is 9.4, Lantus  and sliding scale insulin  on board.  Has been started on Lantus  and sliding scale insulin .  Supposed to be on Ozempic and metformin  at home.  Will discontinue metformin .     Pressure injury of skin, stage I on presentation.  Continue prevention protocol. Pressure Injury 08/07/23 Sacrum Medial Stage 1 -  Intact skin with non-blanchable redness of a localized area usually over a bony prominence. (Active)  08/07/23 1238  Location: Sacrum  Location Orientation: Medial  Staging: Stage 1 -  Intact skin with non-blanchable redness of a localized area usually over a bony prominence.  Wound Description (Comments):   Present on Admission:      Disposition.  At this time, patient is stable for disposition to skilled nursing facility.  Unable to reach the patient's spouse prior to disposition.  Medical Consultants:   Cardiology Interventional Radiology  Procedures:    Cortrak tube tube insertion and removal IR guided gastrostomy tube placement on 08/10/2023  Subjective:   Today, patient was seen and examined in bed bedside.  Denies any nausea vomiting.  Complains of mild abdominal discomfort.  Overall feels okay.  Discharge Exam:   Vitals:   08/12/23 0505 08/12/23 0803  BP: 116/68 (!) 99/51  Pulse: 76 100  Resp: 18 20  Temp: 98.3 F (36.8 C) 98.5 F (36.9 C)  SpO2: 90% 93%   Vitals:   08/11/23 1923 08/12/23 0005 08/12/23 0505 08/12/23 0803  BP: 129/69 (!) 142/83 116/68 (!) 99/51  Pulse: 94 87 76 100  Resp: 18 20 18 20   Temp: 97.8 F (36.6 C) 98.3 F (36.8 C) 98.3 F (36.8 C) 98.5 F (36.9 C)  TempSrc: Oral Oral Oral Oral  SpO2: 98% 91% 90% 93%  Weight:   65.1 kg   Height:       Body mass index is 17.94 kg/m.   General: Alert awake, not in obvious distress thinly built.  Appears chronically ill HENT: pupils equally reacting to light,  No scleral pallor or icterus noted. Oral mucosa is moist.  Chest:  Clear breath sounds.   No crackles or wheezes.  CVS: S1 &S2 heard. No murmur.  Regular rate and rhythm. Abdomen: Soft, nontender, nondistended.  Bowel sounds are heard.   Extremities: No cyanosis, clubbing or edema.  Peripheral pulses are palpable. Psych: Alert, awake and Communicative, follows commands, CNS:  No cranial nerve deficits.  Generalized weakness noted Skin: Warm and dry.  No rashes noted.  The results of significant diagnostics from this hospitalization (including imaging, microbiology, ancillary and laboratory) are listed below for reference.     Diagnostic Studies:   DG Chest Portable 1 View Result Date: 08/02/2023 CLINICAL DATA:  Weakness EXAM: PORTABLE CHEST 1 VIEW COMPARISON:   07/25/2019 FINDINGS: Emphysema. Mild ground-glass infiltrate in the left lung base. No pleural effusion. Normal cardiac size. No pneumothorax IMPRESSION: Emphysema. Mild ground-glass infiltrate in the left lung base, probable pneumonia. Imaging follow-up to resolution is recommended Electronically Signed   By: Esmeralda Hedge M.D.   On: 08/02/2023 23:04     Labs:   Basic Metabolic Panel: Recent Labs  Lab 08/05/23 1717 08/06/23 0933 08/06/23 0933 08/07/23 0304 08/08/23 0251 08/09/23 0257 08/11/23 0304 08/12/23 0340  NA  --  137  --  137  --   --  138 138  K  --  3.7   < > 3.9  --   --  4.0 4.0  CL  --  105  --  104  --   --  105 105  CO2  --  20*  --  20*  --   --  21* 24  GLUCOSE  --  207*  --  252*  --   --  144* 164*  BUN  --  13  --  17  --   --  17 23  CREATININE  --  0.75  --  0.67  --   --  0.69 0.79  CALCIUM   --  9.5  --  9.6  --   --  9.2 9.0  MG  --  1.9  --  1.9 2.0 2.0  --   --   PHOS 3.6 3.6  --  3.9 4.2  --   --   --    < > = values in this interval not displayed.   GFR Estimated Creatinine Clearance: 71.2 mL/min (by C-G formula based on SCr of 0.79 mg/dL). Liver Function Tests: No results for input(s): "AST", "ALT", "ALKPHOS", "BILITOT", "PROT", "ALBUMIN" in the last 168 hours. No results for input(s): "LIPASE", "AMYLASE" in the last 168 hours. No results for input(s): "AMMONIA" in the last 168 hours. Coagulation profile Recent Labs  Lab 08/10/23 0253  INR 1.1    CBC: Recent Labs  Lab 08/06/23 0933 08/09/23 0031 08/10/23 0253 08/12/23 0340  WBC 9.3 11.2* 11.6* 9.9  NEUTROABS 7.3  --   --   --   HGB 16.3 15.6 15.1 14.8  HCT 48.3 45.5 45.5 44.2  MCV 89.8 90.5 92.3 92.5  PLT 257 268 278 273   Cardiac Enzymes: No results for input(s): "CKTOTAL", "CKMB", "CKMBINDEX", "TROPONINI" in the last 168 hours. BNP: Invalid input(s): "POCBNP" CBG: Recent Labs  Lab 08/11/23 1209 08/11/23 1652 08/11/23 2028 08/12/23 0056 08/12/23 0503  GLUCAP 237* 177*  307* 228* 169*   D-Dimer No results for input(s): "DDIMER" in the last 72 hours. Hgb A1c No results for input(s): "HGBA1C" in the last 72 hours. Lipid Profile No results for input(s): "CHOL", "HDL", "LDLCALC", "TRIG", "CHOLHDL", "LDLDIRECT" in the last 72 hours. Thyroid function studies No results for input(s): "TSH", "T4TOTAL", "T3FREE", "THYROIDAB" in the last 72 hours.  Invalid input(s): "FREET3" Anemia work up No results for input(s): "VITAMINB12", "FOLATE", "FERRITIN", "TIBC", "IRON", "RETICCTPCT" in the last 72 hours. Microbiology Recent Results (from the past 240 hours)  Resp panel by RT-PCR (RSV, Flu A&B, Covid) Anterior Nasal Swab     Status: None   Collection Time: 08/02/23 10:40 PM   Specimen: Anterior Nasal Swab  Result Value Ref Range Status   SARS Coronavirus 2 by RT PCR NEGATIVE NEGATIVE Final   Influenza A by PCR NEGATIVE NEGATIVE Final   Influenza B by PCR NEGATIVE NEGATIVE Final    Comment: (NOTE) The Xpert Xpress SARS-CoV-2/FLU/RSV plus assay is intended as an aid in the diagnosis of influenza from Nasopharyngeal swab specimens and should not be used as a sole basis for treatment. Nasal washings and aspirates are unacceptable for Xpert Xpress SARS-CoV-2/FLU/RSV testing.  Fact Sheet for Patients: BloggerCourse.com  Fact Sheet for Healthcare Providers: SeriousBroker.it  This test is not yet approved or cleared by the [place]  FDA and has been authorized for detection and/or diagnosis of SARS-CoV-2 by FDA under an Emergency Use Authorization (EUA). This EUA will remain in effect (meaning this test can be used) for the duration of the COVID-19 declaration under Section 564(b)(1) of the Act, 21 U.S.C. section 360bbb-3(b)(1), unless the authorization is terminated or revoked.     Resp Syncytial Virus by PCR NEGATIVE NEGATIVE Final    Comment: (NOTE) Fact Sheet for  Patients: BloggerCourse.com  Fact Sheet for Healthcare Providers: SeriousBroker.it  This test is not yet approved or cleared by the [place]  FDA and has been authorized for detection and/or diagnosis of SARS-CoV-2 by FDA under an Emergency Use Authorization (EUA). This EUA will remain in effect (meaning this test can be used) for the duration of the COVID-19 declaration under Section 564(b)(1) of the Act, 21 U.S.C.  section 360bbb-3(b)(1), unless the authorization is terminated or revoked.  Performed at [facility], [address] [place], [place] [address]   Blood culture (routine x 2)     Status: None   Collection Time: 08/02/23 11:36 PM   Specimen: BLOOD  Result Value Ref Range Status   Specimen Description BLOOD SITE NOT SPECIFIED  Final   Special Requests   Final    BOTTLES DRAWN AEROBIC AND ANAEROBIC Blood Culture adequate volume   Culture   Final    NO GROWTH 5 DAYS Performed at [facility], [address] [place], [place] [address]    Report Status 08/08/2023 FINAL  Final  Blood culture (routine x 2)     Status: None   Collection Time: 08/02/23 11:41 PM   Specimen: BLOOD  Result Value Ref Range Status   Specimen Description BLOOD BLOOD RIGHT FOREARM  Final   Special Requests   Final    BOTTLES DRAWN AEROBIC AND ANAEROBIC Blood Culture adequate volume   Culture   Final    NO GROWTH 5 DAYS Performed at [facility], [address] [place], [place] [address]    Report Status 08/08/2023 FINAL  Final     Discharge Instructions:   Discharge Instructions     Call MD for:  persistant nausea and vomiting   Complete by: As directed    Call MD for:  redness, tenderness, or signs of infection (pain, swelling, redness, odor or green/yellow discharge around incision site)   Complete by: As directed    Call MD for:  severe uncontrolled pain   Complete by: As directed    Call MD for:  temperature  >100.4   Complete by: As directed    Discharge instructions   Complete by: As directed    Follow-up with your primary care provider at the skilled nursing facility in 3 to 5 days.  Check blood work at that time.   Continue tube feedings.  Seek medical attention for worsening symptoms.   Increase activity slowly   Complete by: As directed    No wound care   Complete by: As directed       Allergies as of 08/12/2023       Reactions   Ibuprofen Hives, Rash   Sulfa Antibiotics Hives   "makes my tongue break out"        Medication List     STOP taking these medications    metFORMIN  1000 MG tablet Commonly known as: GLUCOPHAGE        TAKE these medications    albuterol  108 (90 Base) MCG/ACT inhaler Commonly known as: VENTOLIN  HFA Inhale 2 puffs into the lungs every 6 (six) hours as needed for shortness of breath or wheezing.   ALPRAZolam  0.5 MG tablet Commonly known as: XANAX  Take 1 tablet (0.5 mg total) by mouth 3 (three) times daily as needed for anxiety.   apixaban  5 MG Tabs tablet Commonly known as: ELIQUIS  Place 1 tablet (5 mg total) into feeding tube 2 (two) times daily.   aspirin EC 81 MG tablet Take 81 mg by mouth daily.   feeding supplement (JEVITY 1.5 CAL/FIBER) Liqd Place 355 mLs into feeding tube 4 (four) times daily -  with meals and at bedtime. Start with 120 mL feeds and increase by 60 mL every other feed until goal of 355 mL/feed is reached.   feeding supplement (PROSource TF20) liquid Place 60 mLs into feeding tube daily. Start taking on: Aug 13, 2023   free water  Soln Place 100 mLs into feeding tube every 6 (six) hours.   gabapentin 300 MG capsule Commonly known as: NEURONTIN Take 300 mg by mouth 2 (two) times daily as needed (pain).   Gerhardt's butt cream Crea Apply 1 Application topically 2 (two) times daily.   guaiFENesin  100 MG/5ML liquid Commonly known as: ROBITUSSIN Take 5 mLs by mouth every 4 (four) hours as needed for cough or to  loosen phlegm.   insulin  aspart 100 UNIT/ML injection Commonly known as: novoLOG  Inject 0-9 Units into the skin 4 (four) times daily.   insulin  glargine-yfgn 100 UNIT/ML injection Commonly known as: SEMGLEE  Inject 0.1 mLs (10 Units total) into the skin daily. Start taking on: Aug 13, 2023   metoprolol  tartrate 25 MG tablet Commonly known as: LOPRESSOR  Place 1 tablet (25 mg total) into feeding tube 2 (two) times daily.   multivitamin with minerals Tabs tablet Place 1 tablet into feeding tube daily. Start taking on: Aug 13, 2023   ondansetron  4 MG tablet Commonly known as: ZOFRAN  Take 1 tablet (4 mg total) by mouth every 6 (six) hours as needed for nausea.   Oxycodone  HCl 10 MG Tabs Place 1 tablet (10 mg total) into feeding tube every 6 (six) hours as needed for moderate pain (pain score 4-6) or severe pain (pain score 7-10).   oxyCODONE -acetaminophen  10-325 MG tablet Commonly known as: PERCOCET Take 1 tablet by mouth 4 (four) times daily as needed for pain.   Ozempic (1 MG/DOSE) 4 MG/3ML Sopn Generic drug: Semaglutide (1 MG/DOSE) Inject 1 mg into the skin once a week.   traZODone  50 MG tablet Commonly known as: DESYREL  Place 0.5 tablets (25 mg total) into feeding tube at bedtime as needed for sleep.               Durable Medical Equipment  (From admission, onward)           Start     Ordered   08/04/23 1451  For home use only DME 3 n 1  Once        08/04/23 1450   08/04/23 1451  For home use only DME Hospital bed  Once       Question Answer Comment  Length of Need Lifetime   The above medical condition requires: Patient requires the ability to reposition frequently   Head must be elevated greater than: 30 degrees   Bed type Semi-electric   Support Surface: Gel Overlay      08/04/23 1450   08/04/23 1450  For home use only DME standard manual wheelchair with seat cushion  Once       Comments: Patient suffers from debility which impairs their ability to  perform daily activities like bathing, dressing, feeding, grooming, and toileting in the home.  A walker will not resolve issue with performing activities of daily living. A wheelchair will allow patient to safely perform daily activities. Patient can safely propel the wheelchair in the home or has a caregiver who can provide assistance. Length of need Lifetime. Accessories: elevating leg rests (ELRs), wheel locks, extensions and anti-tippers.   08/04/23 1450            Contact information for after-discharge care     Destination     [facility] .   Service: Skilled Nursing Contact information: [address] [place]  [phone]  Time coordinating discharge: 39 minutes  Signed:  Josslynn Mentzer  [facility] 08/12/2023, 10:09 AM

## 2023-08-12 NOTE — Progress Notes (Signed)
 Mobility Specialist Progress Note:   08/12/23 1200  Mobility  Activity Transferred from chair to bed  Level of Assistance Minimal assist, patient does 75% or more  Assistive Device Other (Comment) (HHA)  Distance Ambulated (ft) 3 ft  Activity Response Tolerated well  Mobility Referral Yes  Mobility visit 1 Mobility  Mobility Specialist Start Time (ACUTE ONLY) 1200  Mobility Specialist Stop Time (ACUTE ONLY) 1212  Mobility Specialist Time Calculation (min) (ACUTE ONLY) 12 min   Pt requesting to return to bed. Required minA to stand from chair, minA to pivot to bed. Pt back in bed with all needs met, alarm on.  Oneda Big Mobility Specialist Please contact via SecureChat or  Rehab office at [phone]

## 2023-08-12 NOTE — TOC Progression Note (Addendum)
 Transition of Care [facility]) - Progression Note    Patient Details  Name: Bruce Franklin MRN: [id number] Date of Birth: 1945-11-09  Transition of Care [facility]) CM/SW Contact  Arron Big, [place] Phone Number: 08/12/2023, 8:57 AM  Clinical Narrative:   Insurance auth approved for [facility] SNF - approval dates 08/11/2023-08/15/2023.  CSW notified facility and inquired about DC if patient is deemed medically stable by treatment team.  10:27 AM Patient can DC to [facility] today. MD and CSW attempted to call patients spouse using several numbers in the chart.    Spouses number [phone]: mailbox is full and cannot accept any new messages.   Home phone [phone] : rung and then a piercing sound rang through the phone.   Another home phone number [phone] states the number you have dialed is not in service, please check the number and try your call again.    TOC will continue to follow.    Expected Discharge Plan: Skilled Nursing Facility Barriers to Discharge: Continued Medical Work up  Expected Discharge Plan and Services In-house Referral: Clinical Social Work     Living arrangements for the past 2 months: Single Family Home                                       Social Determinants of Health (SDOH) Interventions SDOH Screenings   Food Insecurity: No Food Insecurity (08/04/2023)  Housing: High Risk (08/04/2023)  Transportation Needs: Unmet Transportation Needs (08/04/2023)  Utilities: At Risk (08/04/2023)  Social Connections: Socially Isolated (08/04/2023)  Tobacco Use: High Risk (08/03/2023)    Readmission Risk Interventions     No data to display

## 2023-08-12 NOTE — Progress Notes (Signed)
 Mobility Specialist Progress Note:   08/12/23 1020  Mobility  Activity Transferred from bed to chair  Level of Assistance Moderate assist, patient does 50-74%  Assistive Device Front wheel walker  Distance Ambulated (ft) 3 ft  Activity Response Tolerated well  Mobility Referral Yes  Mobility visit 1 Mobility  Mobility Specialist Start Time (ACUTE ONLY) 1020  Mobility Specialist Stop Time (ACUTE ONLY) 1040  Mobility Specialist Time Calculation (min) (ACUTE ONLY) 20 min   Pt agreeable to mobility session. Required minA to stand from EOB, modA to pivot to chair d/t posterior lean. Pt able to correct with cues. Left sitting in chair with all needs met, alarm on.   Oneda Big Mobility Specialist Please contact via SecureChat or  Rehab office at [phone]

## 2023-08-12 NOTE — Progress Notes (Signed)
 Patient report given to nurse Trevor Fudge at [facility].  Araceli Beady, RN

## 2023-08-12 NOTE — TOC Progression Note (Deleted)
 Transition of Care [facility]) - Progression Note    Patient Details  Name: Bruce Franklin MRN: [id number] Date of Birth: 1945-12-16  Transition of Care [facility]) CM/SW Contact  Arron Big, [place] Phone Number: 08/12/2023, 10:21 AM  Clinical Narrative:   MD and CSW attempted to call patients spouse using several numbers in the chart.   Spouses number [phone]: mailbox is full and cannot accept any new messages.  Home phone [phone] : rung and then a piercing sound rang through the phone.  Another home phone number [phone] states the number you have dialed is not in service, please check the number and try your call again.  TOC will continue to follow.    Expected Discharge Plan: Skilled Nursing Facility Barriers to Discharge: Continued Medical Work up  Expected Discharge Plan and Services In-house Referral: Clinical Social Work     Living arrangements for the past 2 months: Single Family Home Expected Discharge Date: 08/12/23                                     Social Determinants of Health (SDOH) Interventions SDOH Screenings   Food Insecurity: No Food Insecurity (08/04/2023)  Housing: High Risk (08/04/2023)  Transportation Needs: Unmet Transportation Needs (08/04/2023)  Utilities: At Risk (08/04/2023)  Social Connections: Socially Isolated (08/04/2023)  Tobacco Use: High Risk (08/03/2023)    Readmission Risk Interventions     No data to display

## 2023-08-28 NOTE — Progress Notes (Deleted)
{  This patient may be at risk for Amyloid. He has one or more dx on the prob list or PMH from the following list -  Abnormal EKG, HFpEF/Diastolic CHF, Aortic Stenosis, LVH, Bilateral Carpal Tunnel Syndrome, Biceps Tendon Rupture, Spinal Stenosis, Pericardial Effusion, Left Atrial Enlargement, Conduction System Disorder. See list below or review PMH.  Diagnoses From Problem List           Noted     Chronic heart failure with mildly reduced ejection fraction (HFmrEF, 41-49%) (HCC) 08/03/2023    Click HERE to open Cardiac Amyloid Screening SmartSet to order screening OR Click HERE to defer testing for 1 year or permanently :1}    Cardiology Office Note:    Date:  08/28/2023  ID:  Bruce Franklin, DOB November 18, 1945, MRN [id number] PCP: [facility] Providers Cardiologist:  None { Click to update primary MD,subspecialty MD or APP then REFRESH:1}    {Click to Open Review  :1}   Patient Profile:      Coronary artery disease  Anterior STEMI in 03/2018 s/p 3 x 23 mm DES to mLAD ([facility]) LHC 04/02/18: mLM 10, pLAD 20, mLAD 90; pLCx 40, mLCx 30; mRCA 30 HFmrEF (heart failure with mildly reduced ejection fraction)  Ischemic CM Hx of LV apical thrombus  TTE 04/03/18 ([facility]) 20-25  TTE 10/01/19 ([facility]): EF 40 TTE 08/04/23: EF 50-55, ?apical HK, NL RVSF Paroxysmal Atrial fibrillation  Diabetes mellitus  Chronic Obstructive Pulmonary Disease  Hypertension  Hyperlipidemia  Sacral decubitus Severe dysphagia s/p PEG tube Hx of aspiration pneumonia 07/2023 DNR (Do Not Resuscitate)         Discussed the use of AI scribe software for clinical note transcription with the patient, who gave verbal consent to proceed.  History of Present Illness Bruce Franklin is a 78 y.o. male who returns for post hospital follow up. He was admitted 4/22-5/2 with sepsis due to pneumonia (probable aspiration). He was noted to have severe dysphagia and ultimately underwent PEG tube  placement due to high risk for aspiration (failed MBS). Hospitalization was c/b brief episodes of AF w RVR. He was previously followed at [facility]. He was seen by Dr. Emmette Harms for our service and he plans to follow up with [facility]. He has been off of his anticoagulation prior to admission. Eliquis  was resumed. He converted back to NSR on rate controlling meds.    ROS-See HPI***     Studies Reviewed:       *** Results    Risk Assessment/Calculations:   {Does this patient have ATRIAL FIBRILLATION?:[phone]} No BP recorded.  {Refresh Note OR Click here to enter BP  :1}***       Physical Exam:   VS:  There were no vitals taken for this visit.   Wt Readings from Last 3 Encounters:  08/12/23 143 lb 8.3 oz (65.1 kg)  12/25/17 160 lb (72.6 kg)  03/30/16 164 lb 3.9 oz (74.5 kg)    Physical Exam***     Assessment and Plan:  Assessment & Plan Assessment and Plan Assessment & Plan    {      :1}    {Are you ordering a CV Procedure (e.g. stress test, cath, DCCV, TEE, etc)?   Press F2        :[id number]}  Dispo:  No follow-ups on file.  Signed, Marlyse Single, PA-C

## 2023-08-29 ENCOUNTER — Ambulatory Visit: Attending: Physician Assistant | Admitting: Physician Assistant

## 2023-08-29 DIAGNOSIS — I5022 Chronic systolic (congestive) heart failure: Secondary | ICD-10-CM

## 2023-08-29 DIAGNOSIS — I2510 Atherosclerotic heart disease of native coronary artery without angina pectoris: Secondary | ICD-10-CM

## 2023-08-29 DIAGNOSIS — I480 Paroxysmal atrial fibrillation: Secondary | ICD-10-CM

## 2024-02-21 ENCOUNTER — Inpatient Hospital Stay (HOSPITAL_COMMUNITY)
Admission: EM | Admit: 2024-02-21 | Discharge: 2024-02-28 | DRG: 871 | Disposition: A | Discharge status: Hospice | Attending: Family Medicine | Admitting: Family Medicine

## 2024-02-21 ENCOUNTER — Other Ambulatory Visit: Payer: Self-pay

## 2024-02-21 ENCOUNTER — Encounter (HOSPITAL_COMMUNITY): Payer: Self-pay

## 2024-02-21 ENCOUNTER — Emergency Department (HOSPITAL_COMMUNITY)

## 2024-02-21 DIAGNOSIS — Z79899 Other long term (current) drug therapy: Secondary | ICD-10-CM

## 2024-02-21 DIAGNOSIS — G894 Chronic pain syndrome: Secondary | ICD-10-CM | POA: Diagnosis present

## 2024-02-21 DIAGNOSIS — R64 Cachexia: Secondary | ICD-10-CM | POA: Diagnosis present

## 2024-02-21 DIAGNOSIS — R0902 Hypoxemia: Secondary | ICD-10-CM | POA: Diagnosis not present

## 2024-02-21 DIAGNOSIS — K5900 Constipation, unspecified: Secondary | ICD-10-CM | POA: Diagnosis present

## 2024-02-21 DIAGNOSIS — J690 Pneumonitis due to inhalation of food and vomit: Secondary | ICD-10-CM | POA: Diagnosis present

## 2024-02-21 DIAGNOSIS — Z794 Long term (current) use of insulin: Secondary | ICD-10-CM

## 2024-02-21 DIAGNOSIS — Z8546 Personal history of malignant neoplasm of prostate: Secondary | ICD-10-CM

## 2024-02-21 DIAGNOSIS — Z1152 Encounter for screening for COVID-19: Secondary | ICD-10-CM

## 2024-02-21 DIAGNOSIS — Z981 Arthrodesis status: Secondary | ICD-10-CM

## 2024-02-21 DIAGNOSIS — J188 Other pneumonia, unspecified organism: Secondary | ICD-10-CM | POA: Diagnosis present

## 2024-02-21 DIAGNOSIS — Z79891 Long term (current) use of opiate analgesic: Secondary | ICD-10-CM

## 2024-02-21 DIAGNOSIS — E43 Unspecified severe protein-calorie malnutrition: Secondary | ICD-10-CM | POA: Diagnosis present

## 2024-02-21 DIAGNOSIS — A419 Sepsis, unspecified organism: Principal | ICD-10-CM | POA: Diagnosis present

## 2024-02-21 DIAGNOSIS — Z825 Family history of asthma and other chronic lower respiratory diseases: Secondary | ICD-10-CM

## 2024-02-21 DIAGNOSIS — E785 Hyperlipidemia, unspecified: Secondary | ICD-10-CM | POA: Diagnosis present

## 2024-02-21 DIAGNOSIS — I480 Paroxysmal atrial fibrillation: Secondary | ICD-10-CM | POA: Diagnosis present

## 2024-02-21 DIAGNOSIS — Z7982 Long term (current) use of aspirin: Secondary | ICD-10-CM

## 2024-02-21 DIAGNOSIS — J9601 Acute respiratory failure with hypoxia: Secondary | ICD-10-CM | POA: Diagnosis present

## 2024-02-21 DIAGNOSIS — I2510 Atherosclerotic heart disease of native coronary artery without angina pectoris: Secondary | ICD-10-CM | POA: Diagnosis present

## 2024-02-21 DIAGNOSIS — Z7901 Long term (current) use of anticoagulants: Secondary | ICD-10-CM

## 2024-02-21 DIAGNOSIS — I110 Hypertensive heart disease with heart failure: Secondary | ICD-10-CM | POA: Diagnosis present

## 2024-02-21 DIAGNOSIS — Z681 Body mass index (BMI) 19 or less, adult: Secondary | ICD-10-CM

## 2024-02-21 DIAGNOSIS — F419 Anxiety disorder, unspecified: Secondary | ICD-10-CM | POA: Diagnosis present

## 2024-02-21 DIAGNOSIS — E1165 Type 2 diabetes mellitus with hyperglycemia: Secondary | ICD-10-CM | POA: Diagnosis present

## 2024-02-21 DIAGNOSIS — Z888 Allergy status to other drugs, medicaments and biological substances status: Secondary | ICD-10-CM

## 2024-02-21 DIAGNOSIS — I4891 Unspecified atrial fibrillation: Secondary | ICD-10-CM | POA: Insufficient documentation

## 2024-02-21 DIAGNOSIS — R6520 Severe sepsis without septic shock: Secondary | ICD-10-CM | POA: Diagnosis present

## 2024-02-21 DIAGNOSIS — I5032 Chronic diastolic (congestive) heart failure: Secondary | ICD-10-CM | POA: Diagnosis present

## 2024-02-21 DIAGNOSIS — Z882 Allergy status to sulfonamides status: Secondary | ICD-10-CM

## 2024-02-21 DIAGNOSIS — I5030 Unspecified diastolic (congestive) heart failure: Secondary | ICD-10-CM

## 2024-02-21 DIAGNOSIS — R54 Age-related physical debility: Secondary | ICD-10-CM | POA: Diagnosis present

## 2024-02-21 DIAGNOSIS — J189 Pneumonia, unspecified organism: Principal | ICD-10-CM | POA: Diagnosis present

## 2024-02-21 DIAGNOSIS — Z66 Do not resuscitate: Secondary | ICD-10-CM | POA: Diagnosis present

## 2024-02-21 DIAGNOSIS — Z8249 Family history of ischemic heart disease and other diseases of the circulatory system: Secondary | ICD-10-CM

## 2024-02-21 DIAGNOSIS — Z515 Encounter for palliative care: Secondary | ICD-10-CM

## 2024-02-21 DIAGNOSIS — J440 Chronic obstructive pulmonary disease with acute lower respiratory infection: Secondary | ICD-10-CM | POA: Diagnosis present

## 2024-02-21 DIAGNOSIS — Z9049 Acquired absence of other specified parts of digestive tract: Secondary | ICD-10-CM

## 2024-02-21 DIAGNOSIS — R1310 Dysphagia, unspecified: Secondary | ICD-10-CM | POA: Diagnosis present

## 2024-02-21 DIAGNOSIS — Z87891 Personal history of nicotine dependence: Secondary | ICD-10-CM

## 2024-02-21 DIAGNOSIS — Z7985 Long-term (current) use of injectable non-insulin antidiabetic drugs: Secondary | ICD-10-CM

## 2024-02-21 DIAGNOSIS — R1032 Left lower quadrant pain: Secondary | ICD-10-CM | POA: Diagnosis present

## 2024-02-21 LAB — COMPREHENSIVE METABOLIC PANEL WITH GFR
ALT: 17 U/L (ref 0–44)
AST: 14 U/L — ABNORMAL LOW (ref 15–41)
Albumin: 3.3 g/dL — ABNORMAL LOW (ref 3.5–5.0)
Alkaline Phosphatase: 50 U/L (ref 38–126)
Anion gap: 11 (ref 5–15)
BUN: 23 mg/dL (ref 8–23)
CO2: 22 mmol/L (ref 22–32)
Calcium: 9.5 mg/dL (ref 8.9–10.3)
Chloride: 105 mmol/L (ref 98–111)
Creatinine, Ser: 1.12 mg/dL (ref 0.61–1.24)
GFR, Estimated: >60 mL/min (ref >60)
Glucose, Bld: 224 mg/dL — ABNORMAL HIGH (ref 70–99)
Potassium: 4.6 mmol/L (ref 3.5–5.1)
Sodium: 138 mmol/L (ref 135–145)
Total Bilirubin: 0.9 mg/dL (ref 0.0–1.2)
Total Protein: 7.9 g/dL (ref 6.5–8.1)

## 2024-02-21 LAB — RESP PANEL BY RT-PCR (RSV, FLU A&B, COVID)  RVPGX2
Influenza A by PCR: NEGATIVE
Influenza B by PCR: NEGATIVE
Resp Syncytial Virus by PCR: NEGATIVE
SARS Coronavirus 2 by RT PCR: NEGATIVE

## 2024-02-21 LAB — CBC
HCT: 49.6 % (ref 39.0–52.0)
Hemoglobin: 16.3 g/dL (ref 13.0–17.0)
MCH: 29.9 pg (ref 26.0–34.0)
MCHC: 32.9 g/dL (ref 30.0–36.0)
MCV: 90.8 fL (ref 80.0–100.0)
Platelets: 342 K/uL (ref 150–400)
RBC: 5.46 MIL/uL (ref 4.22–5.81)
RDW: 13.8 % (ref 11.5–15.5)
WBC: 17.1 K/uL — ABNORMAL HIGH (ref 4.0–10.5)
nRBC: 0 % (ref 0.0–0.2)

## 2024-02-21 LAB — CBG MONITORING, ED: Glucose-Capillary: 220 mg/dL — ABNORMAL HIGH (ref 70–99)

## 2024-02-21 MED ORDER — OXYCODONE HCL 5 MG PO TABS
10.0000 mg | ORAL_TABLET | Freq: Once | ORAL | Status: AC
  Administered 2024-02-21: 10 mg via ORAL
  Filled 2024-02-21: qty 2

## 2024-02-21 MED ORDER — POLYETHYLENE GLYCOL 3350 17 G PO PACK
17.0000 g | PACK | Freq: Every day | ORAL | Status: DC
  Administered 2024-02-21 – 2024-02-22 (×2): 17 g via ORAL
  Filled 2024-02-21 (×3): qty 1

## 2024-02-21 MED ORDER — SODIUM CHLORIDE 0.9 % IV SOLN: INTRAVENOUS | Status: AC

## 2024-02-21 MED ORDER — INSULIN ASPART 100 UNIT/ML IJ SOLN
0.0000 [IU] | INTRAMUSCULAR | Status: DC
  Administered 2024-02-22 (×3): 2 [IU] via SUBCUTANEOUS
  Administered 2024-02-22: 3 [IU] via SUBCUTANEOUS
  Administered 2024-02-23: 2 [IU] via SUBCUTANEOUS
  Filled 2024-02-21: qty 3
  Filled 2024-02-21 (×4): qty 2

## 2024-02-21 MED ORDER — ACETAMINOPHEN 325 MG PO TABS
650.0000 mg | ORAL_TABLET | Freq: Four times a day (QID) | ORAL | Status: DC | PRN
  Administered 2024-02-22 – 2024-02-26 (×3): 650 mg via ORAL
  Filled 2024-02-21 (×4): qty 2

## 2024-02-21 MED ORDER — SODIUM CHLORIDE 0.9 % IV SOLN
3.0000 g | Freq: Four times a day (QID) | INTRAVENOUS | Status: DC
  Administered 2024-02-22 – 2024-02-28 (×26): 3 g via INTRAVENOUS
  Filled 2024-02-21 (×26): qty 8

## 2024-02-21 MED ORDER — ACETAMINOPHEN 650 MG RE SUPP: 650.0000 mg | Freq: Four times a day (QID) | RECTAL | Status: DC | PRN

## 2024-02-21 MED ORDER — SENNOSIDES-DOCUSATE SODIUM 8.6-50 MG PO TABS: 1.0000 | ORAL_TABLET | Freq: Every evening | ORAL | Status: DC | PRN

## 2024-02-21 MED ORDER — IOHEXOL 350 MG/ML SOLN
75.0000 mL | Freq: Once | INTRAVENOUS | Status: AC | PRN
  Administered 2024-02-21: 75 mL via INTRAVENOUS

## 2024-02-21 MED ORDER — GUAIFENESIN ER 600 MG PO TB12
600.0000 mg | ORAL_TABLET | Freq: Two times a day (BID) | ORAL | Status: DC
  Administered 2024-02-22 – 2024-02-24 (×6): 600 mg via ORAL
  Filled 2024-02-21 (×7): qty 1

## 2024-02-21 MED ORDER — LEVALBUTEROL HCL 0.63 MG/3ML IN NEBU
0.6300 mg | INHALATION_SOLUTION | Freq: Four times a day (QID) | RESPIRATORY_TRACT | Status: DC | PRN
  Administered 2024-02-28: 0.63 mg via RESPIRATORY_TRACT
  Filled 2024-02-21 (×3): qty 3

## 2024-02-21 MED ORDER — DOXYCYCLINE HYCLATE 100 MG PO TABS
100.0000 mg | ORAL_TABLET | Freq: Once | ORAL | Status: AC
  Administered 2024-02-21: 100 mg via ORAL
  Filled 2024-02-21: qty 1

## 2024-02-21 MED ORDER — SODIUM CHLORIDE 0.9 % IV SOLN
1.0000 g | Freq: Once | INTRAVENOUS | Status: AC
  Administered 2024-02-21: 1 g via INTRAVENOUS
  Filled 2024-02-21: qty 10

## 2024-02-21 NOTE — ED Provider Notes (Signed)
 [place] EMERGENCY DEPARTMENT AT [facility] Provider Note   CSN: [id number] Arrival date & time: 02/21/24  1319     Patient presents with: Emesis   Bruce Franklin is a 78 y.o. male presenting from home with complaint of abdominal pain, shortness of breath.  Patient has a history of COPD, does not wear oxygen at home.  He had a PEG tube removed about 3 to 4 months ago by the GI service, at that time having difficulty swallowing, but had improved enough to wanted to have the tube removed.  He said he has had pain ever since the removal.  He is on chronic oxycodone  for pain, but has difficulty moving his bowels, and is quite sporadic with this.  He has pain with eating and has had a poor appetite.  He has also felt more short of breath and fatigued the past couple days.   HPI     Prior to Admission medications   Medication Sig Start Date End Date Taking? Authorizing Provider  albuterol  (VENTOLIN  HFA) 108 (90 Base) MCG/ACT inhaler Inhale 2 puffs into the lungs every 6 (six) hours as needed for shortness of breath or wheezing. 07/04/23   [provider]  ALPRAZolam  (XANAX ) 0.5 MG tablet Take 1 tablet (0.5 mg total) by mouth 3 (three) times daily as needed for anxiety. 08/12/23 08/11/24  Pokhrel, Vernal, MD  apixaban  (ELIQUIS ) 5 MG TABS tablet Place 1 tablet (5 mg total) into feeding tube 2 (two) times daily. 08/12/23   Pokhrel, Laxman, MD  aspirin EC 81 MG tablet Take 81 mg by mouth daily.    [provider]  gabapentin (NEURONTIN) 300 MG capsule Take 300 mg by mouth 2 (two) times daily as needed (pain). 06/05/23   [provider]  guaiFENesin  (ROBITUSSIN) 100 MG/5ML liquid Take 5 mLs by mouth every 4 (four) hours as needed for cough or to loosen phlegm. 08/12/23   Pokhrel, Laxman, MD  insulin  aspart (NOVOLOG ) 100 UNIT/ML injection Inject 0-9 Units into the skin 4 (four) times daily. 08/12/23   Pokhrel, Laxman, MD  insulin  glargine-yfgn (SEMGLEE ) 100 UNIT/ML  injection Inject 0.1 mLs (10 Units total) into the skin daily. 08/13/23   Pokhrel, Laxman, MD  metoprolol  tartrate (LOPRESSOR ) 25 MG tablet Place 1 tablet (25 mg total) into feeding tube 2 (two) times daily. 08/12/23   Pokhrel, Laxman, MD  Multiple Vitamin (MULTIVITAMIN WITH MINERALS) TABS tablet Place 1 tablet into feeding tube daily. 08/13/23   Pokhrel, Laxman, MD  Nutritional Supplements (FEEDING SUPPLEMENT, JEVITY 1.5 CAL/FIBER,) LIQD Place 355 mLs into feeding tube 4 (four) times daily -  with meals and at bedtime. Start with 120 mL feeds and increase by 60 mL every other feed until goal of 355 mL/feed is reached. 08/12/23   Pokhrel, Laxman, MD  Nystatin (GERHARDT'S BUTT CREAM) CREA Apply 1 Application topically 2 (two) times daily. 08/12/23   Pokhrel, Vernal, MD  ondansetron  (ZOFRAN ) 4 MG tablet Take 1 tablet (4 mg total) by mouth every 6 (six) hours as needed for nausea. 08/12/23   Pokhrel, Vernal, MD  oxyCODONE  10 MG TABS Place 1 tablet (10 mg total) into feeding tube every 6 (six) hours as needed for moderate pain (pain score 4-6) or severe pain (pain score 7-10). 08/12/23 08/11/24  Pokhrel, Laxman, MD  oxyCODONE -acetaminophen  (PERCOCET) 10-325 MG tablet Take 1 tablet by mouth 4 (four) times daily as needed for pain. 08/02/23   [provider]  OZEMPIC, 1 MG/DOSE, 4 MG/3ML SOPN Inject  1 mg into the skin once a week. 04/30/23   [provider]  Protein (FEEDING SUPPLEMENT, PROSOURCE TF20,) liquid Place 60 mLs into feeding tube daily. 08/13/23   Pokhrel, Laxman, MD  traZODone  (DESYREL ) 50 MG tablet Place 0.5 tablets (25 mg total) into feeding tube at bedtime as needed for sleep. 08/12/23   Pokhrel, Vernal, MD  Water  For Irrigation, Sterile (FREE WATER ) SOLN Place 100 mLs into feeding tube every 6 (six) hours. 08/12/23   Pokhrel, Vernal, MD    Allergies: Ibuprofen and Sulfa antibiotics    Review of Systems  Updated Vital Signs BP 124/83   Pulse 89   Temp 97.8 F (36.6 C)   Resp 17   SpO2 96%    Physical Exam Constitutional:      General: He is not in acute distress.    Comments: Thin, frail habitus  HENT:     Head: Normocephalic and atraumatic.  Eyes:     Conjunctiva/sclera: Conjunctivae normal.     Pupils: Pupils are equal, round, and reactive to light.  Cardiovascular:     Rate and Rhythm: Normal rate and regular rhythm.  Pulmonary:     Effort: Pulmonary effort is normal. No respiratory distress.     Comments: 86% on room air, improved to 95% on 2L [place], no significant audible wheezing, rhonchi in the lower lungs bilaterally Abdominal:     General: There is no distension.     Tenderness: There is abdominal tenderness.  Skin:    General: Skin is warm and dry.  Neurological:     General: No focal deficit present.     Mental Status: He is alert. Mental status is at baseline.  Psychiatric:        Mood and Affect: Mood normal.        Behavior: Behavior normal.     (all labs ordered are listed, but only abnormal results are displayed) Labs Reviewed  COMPREHENSIVE METABOLIC PANEL WITH GFR - Abnormal; Notable for the following components:      Result Value   Glucose, Bld 224 (*)    Albumin 3.3 (*)    AST 14 (*)    All other components within normal limits  CBC - Abnormal; Notable for the following components:   WBC 17.1 (*)    All other components within normal limits  CBG MONITORING, ED - Abnormal; Notable for the following components:   Glucose-Capillary 220 (*)    All other components within normal limits  RESP PANEL BY RT-PCR (RSV, FLU A&B, COVID)  [id number]    EKG: EKG Interpretation Date/Time:  Tuesday February 21 2024 15:32:00 EST Ventricular Rate:  112 PR Interval:  136 QRS Duration:  76 QT Interval:  344 QTC Calculation: 469 R Axis:   100  Text Interpretation: Sinus tachycardia with Premature atrial complexes Rightward axis When compared with ECG of 03-Aug-2023 18:31, PREVIOUS ECG IS PRESENT Confirmed by Cottie Cough [phone]) on 02/21/2024 8:53:39  PM  Radiology: CT ABDOMEN PELVIS W CONTRAST Result Date: 02/21/2024 CLINICAL DATA:  Left upper quadrant abdominal pain. EXAM: CT ABDOMEN AND PELVIS WITH CONTRAST TECHNIQUE: Multidetector CT imaging of the abdomen and pelvis was performed using the standard protocol following bolus administration of intravenous contrast. RADIATION DOSE REDUCTION: This exam was performed according to the departmental dose-optimization program which includes automated exposure control, adjustment of the mA and/or kV according to patient size and/or use of iterative reconstruction technique. CONTRAST:  75mL OMNIPAQUE  IOHEXOL  350 MG/ML SOLN COMPARISON:  CT abdomen pelvis dated  08/08/2023. FINDINGS: Evaluation of this exam is limited due to respiratory motion. Lower chest: Scattered bibasilar tiny ground-glass densities, left greater right suspicious for pneumonia or aspiration. No intra-abdominal free air or free fluid. Hepatobiliary: The liver is unremarkable. There is biliary dilatation, post cholecystectomy. There is mild pneumobilia. Pancreas: Unremarkable. No pancreatic ductal dilatation or surrounding inflammatory changes. Spleen: Normal in size without focal abnormality. Adrenals/Urinary Tract: The adrenal glands unremarkable. There is no hydronephrosis on either side. The visualized ureters and urinary bladder probable Stomach/Bowel: There is moderate stool throughout the colon. There is no bowel obstruction or active inflammation. The appendix is not visualized with certainty. No inflammatory changes identified in the right lower quadrant. Vascular/Lymphatic: Moderate aortoiliac atherosclerotic disease. The IVC is unremarkable. No portal venous gas. There is no adenopathy. Reproductive: Prostatectomy. Other: None Musculoskeletal: Osteopenia with degenerative changes. Lower lumbar fusion. No acute osseous pathology. IMPRESSION: 1. No acute intra-abdominal or pelvic pathology. 2. Moderate colonic stool burden. No bowel  obstruction. 3. Scattered bibasilar tiny ground-glass densities, left greater right suspicious for pneumonia or aspiration. 4.  Aortic Atherosclerosis (ICD10-I70.0). Electronically Signed   By: Vanetta Chou M.D.   On: 02/21/2024 17:07   DG Chest 1 View Result Date: 02/21/2024 EXAM: 1 VIEW(S) XRAY OF THE CHEST 02/21/2024 03:48:00 PM COMPARISON: Sleep comparison 08/02/2023. CLINICAL HISTORY: SOB (shortness of breath); Cough. FINDINGS: LUNGS AND PLEURA: Hyperexpanded lungs with findings of emphysema. Patchy perihilar opacities in the left upper lung zone. Nodular opacities also noted in the right lung base, measuring up to 3.2 cm, worrisome for multifocal pneumonia. No pleural effusion. No pneumothorax. HEART AND MEDIASTINUM: No acute abnormality of the cardiac and mediastinal silhouettes. BONES AND SOFT TISSUES: Multilevel thoracic osteophytosis. No acute osseous abnormality. IMPRESSION: 1. Emphysema. Patchy perihilar airspace opacities in the left upper and right lower lung zones, suspicious for multifocal pneumonia. 2. One opacity in the right lung base measures 3.2 cm and appears more nodular. Follow-up chest radiograph in 6 to 12 weeks is recommended to document resolution and exclude developing neoplasm. Electronically signed by: Rogelia Myers MD 02/21/2024 03:56 PM EST RP Workstation: [id number]     Procedures   Medications Ordered in the ED  polyethylene glycol (MIRALAX  / GLYCOLAX ) packet 17 g (17 g Oral Given 02/21/24 2128)  oxyCODONE  (Oxy IR/ROXICODONE ) immediate release tablet 10 mg (10 mg Oral Given 02/21/24 1517)  iohexol  (OMNIPAQUE ) 350 MG/ML injection 75 mL (75 mLs Intravenous Contrast Given 02/21/24 1645)  cefTRIAXone  (ROCEPHIN ) 1 g in sodium chloride  0.9 % 100 mL IVPB (0 g Intravenous Stopped 02/21/24 2230)  doxycycline  (VIBRA -TABS) tablet 100 mg (100 mg Oral Given 02/21/24 2128)    Clinical Course as of 02/21/24 2246  Tue Feb 21, 2024  2152 Admitted to hospitalist [MT]     Clinical Course User Index [MT] Cottie Donnice PARAS, MD                                 Medical Decision Making Risk OTC drugs. Prescription drug management. Decision regarding hospitalization.   This patient presents to the ED with concern for abdominal discomfort, shortness of breath, general fatigue. This involves an extensive number of treatment options, and is a complaint that carries with it a high risk of complications and morbidity.  The differential diagnosis includes infection including pneumonia versus intra-abdominal inflammation including colitis versus diverticulitis versus gastritis versus other  Viral infection including COVID-19 is also on the differential  Co-morbidities that complicate  the patient evaluation: History of COPD at high risk of pulmonary exacerbation  Additional history obtained from patient's wife and son at the bedside  External records from outside source obtained and reviewed including GI records from July reporting removal of PEG tube  I ordered and personally interpreted labs.  The pertinent results include: Elevated white blood cell count.  Mildly elevated glucose  I ordered imaging studies including x-ray of the chest, CT abdomen pelvis I independently visualized and interpreted imaging which showed concern for lower lobe infiltrate or potential pneumonia versus aspiration; moderate stool burden I agree with the radiologist interpretation  The patient was maintained on a cardiac monitor.  I personally viewed and interpreted the cardiac monitored which showed an underlying rhythm of: Sinus rhythm  Per my interpretation the patient's ECG shows no acute ischemic finding I  I ordered medication including antibiotics for suspected pneumonia.  I have reviewed the patients home medicines and have made adjustments as needed  Test Considered: Doubt acute mesenteric ischemia, doubt acute PE  Patient was reassessed and remained stable on 2 L nasal  cannula.  I suspect his borderline hypoxia is a combination of his chronic COPD hypoxemia as well as aspiration pneumonia, including with vomiting earlier this week.  We will admit him for antibiotics for this issue.  I suspect much of his abdominal pain may be related to chronic constipation, which may be opioid induced.  He may benefit from MiraLAX  for bowel cleanout in the hospital at home.  He has not been using any laxatives until now.  Disposition:  After consideration of the diagnostic results and the patients response to treatment, I feel that the patient would benefit from medical admission      Final diagnoses:  Pneumonia due to infectious organism, unspecified laterality, unspecified part of lung  Hypoxia  Constipation, unspecified constipation type    ED Discharge Orders     None          Cottie Donnice PARAS, MD 02/21/24 2246

## 2024-02-21 NOTE — ED Provider Triage Note (Signed)
 Emergency Medicine Provider Triage Evaluation Note  Bruce Franklin , a 78 y.o. male  was evaluated in triage.  Pt complains of abdominal pain, shortness of breath, nausea. Patient had PEG tube removed back in April and has been experiencing abdominal pain and nausea since. Denies known fever or chills. Does appreciate productive cough and shortness of breath. Patient placed on 2L of [place] O2 in triage due to hypoxia.   Review of Systems  Positive: Abdominal pain, shortness of breath, nausea, cough Negative: Fever, chills, chest pain  Physical Exam  BP 99/70   Pulse (!) 114   Temp 97.8 F (36.6 C) (Oral)   Resp 18   SpO2 95%  Gen:   Awake, no distress   Resp:  Normal effort  MSK:   Moves extremities without difficulty  Other:  Patient ill-appearing, talking in full sentences on 2L [place], productive cough  Medical Decision Making  Medically screening exam initiated at 3:07 PM.  Appropriate orders placed.  Bruce Franklin was informed that the remainder of the evaluation will be completed by another provider, this initial triage assessment does not replace that evaluation, and the importance of remaining in the ED until their evaluation is complete.  Orders: CBC, CMP, POC CBG, Respiratory panel, CXR, CT abdomen pelvis with contrast, EKG, home oxycodone   *Patient requesting pain medications so home dose of oxycodone  givenDEWAINE Janetta Terrall [name], PA-C 02/21/24 1513

## 2024-02-21 NOTE — H&P (Signed)
 History and Physical    Bruce Franklin FMW:[id number] DOB: Sep 16, 1945 DOA: 02/21/2024  PCP: Dalbert Standing, MD  Patient coming from: Home  Chief Complaint: Abdominal pain  HPI: Bruce Franklin is a 78 y.o. male with medical history significant of COPD not on home oxygen, CAD, HFpEF, A-fib on Eliquis , hypertension, hyperlipidemia, insulin -dependent type 2 diabetes, prostate cancer, anxiety, tobacco abuse, chronic pain syndrome, oropharyngeal dysphagia underwent PEG tube placement in April 2025 and later improved and PEG tube was removed on 7/31.  Patient presents to the ED today with complaints of abdominal pain and shortness of breath.  Symptoms started about 3 days ago.  He is endorsing shortness of breath and productive cough.  Denies fevers or chest pain.  Also endorsing constipation and left lower quadrant abdominal pain for the past 3 days.  Last bowel movement was 3 days ago.  He takes Percocet for chronic pain.  Denies nausea or vomiting.  Not able to give any additional history.  ED Course: SpO2 89% on room air on arrival, improved with 2 L [place]. Tachycardic to the 110s on arrival but afebrile. Labs notable for WBC count 17.1, glucose 224, bicarb 22, no elevation of LFTs, COVID/influenza/RSV PCR negative. Chest x-ray showing patchy perihilar airspace opacities in the left upper and right lower lung zones suspicious for multifocal pneumonia. One opacity in the right lung base measures 3.2 cm and appears more lobular. Radiologist recommending follow-up chest radiograph in 6 to 12 weeks to assess resolution and exclude developing neoplasm. CT abdomen pelvis negative for acute findings. Showing moderate colonic stool burden but no signs of bowel obstruction. Showing scattered bibasilar tiny ground glass densities, left greater than right, suspicious for pneumonia or aspiration. EKG showing sinus tachycardia, PACs, and no acute ischemic changes. Patient was given ceftriaxone , doxycycline , oxycodone ,  and MiraLAX .   Review of Systems:  Review of Systems  All other systems reviewed and are negative.   Past Medical History:  Diagnosis Date   Acute biliary pancreatitis    Anxiety    Cholelithiasis    COPD (chronic obstructive pulmonary disease) (HCC)    Diabetes mellitus without complication (HCC)    Pneumonia    Prostate cancer (HCC)    Smokes 1 pack of cigarettes per day     Past Surgical History:  Procedure Laterality Date   CHOLECYSTECTOMY N/A 04/01/2016   Procedure: LAPAROSCOPIC CHOLECYSTECTOMY;  Surgeon: Mitzie DELENA Freund, MD;  Location: [facility] OR;  Service: General;  Laterality: N/A;   ERCP N/A 03/30/2016   Procedure: ENDOSCOPIC RETROGRADE CHOLANGIOPANCREATOGRAPHY (ERCP);  Surgeon: Toribio SHAUNNA Cedar, MD;  Location: [facility] ENDOSCOPY;  Service: Endoscopy;  Laterality: N/A;   IR GASTROSTOMY TUBE MOD SED  08/10/2023   LUMBAR FUSION     PROSTATE SURGERY     ROTATOR CUFF REPAIR       reports that he has been smoking cigarettes. He has a 52 pack-year smoking history. He does not have any smokeless tobacco history on file. He reports that he does not drink alcohol  and does not use drugs.  Allergies  Allergen Reactions   Ibuprofen Hives and Rash   Sulfa Antibiotics Hives    makes my tongue break out    Family History  Problem Relation Age of Onset   Heart attack Mother    COPD Father     Prior to Admission medications   Medication Sig Start Date End Date Taking? Authorizing Provider  albuterol  (VENTOLIN  HFA) 108 (90 Base) MCG/ACT inhaler Inhale 2 puffs into the  lungs every 6 (six) hours as needed for shortness of breath or wheezing. 07/04/23   [provider]  ALPRAZolam  (XANAX ) 0.5 MG tablet Take 1 tablet (0.5 mg total) by mouth 3 (three) times daily as needed for anxiety. 08/12/23 08/11/24  Pokhrel, Laxman, MD  apixaban  (ELIQUIS ) 5 MG TABS tablet Place 1 tablet (5 mg total) into feeding tube 2 (two) times daily. 08/12/23   Pokhrel, Laxman, MD  aspirin EC 81 MG tablet Take  81 mg by mouth daily.    [provider]  gabapentin (NEURONTIN) 300 MG capsule Take 300 mg by mouth 2 (two) times daily as needed (pain). 06/05/23   [provider]  guaiFENesin  (ROBITUSSIN) 100 MG/5ML liquid Take 5 mLs by mouth every 4 (four) hours as needed for cough or to loosen phlegm. 08/12/23   Pokhrel, Laxman, MD  insulin  aspart (NOVOLOG ) 100 UNIT/ML injection Inject 0-9 Units into the skin 4 (four) times daily. 08/12/23   Pokhrel, Laxman, MD  insulin  glargine-yfgn (SEMGLEE ) 100 UNIT/ML injection Inject 0.1 mLs (10 Units total) into the skin daily. 08/13/23   Pokhrel, Laxman, MD  metoprolol  tartrate (LOPRESSOR ) 25 MG tablet Place 1 tablet (25 mg total) into feeding tube 2 (two) times daily. 08/12/23   Pokhrel, Laxman, MD  Multiple Vitamin (MULTIVITAMIN WITH MINERALS) TABS tablet Place 1 tablet into feeding tube daily. 08/13/23   Pokhrel, Laxman, MD  Nutritional Supplements (FEEDING SUPPLEMENT, JEVITY 1.5 CAL/FIBER,) LIQD Place 355 mLs into feeding tube 4 (four) times daily -  with meals and at bedtime. Start with 120 mL feeds and increase by 60 mL every other feed until goal of 355 mL/feed is reached. 08/12/23   Pokhrel, Laxman, MD  Nystatin (GERHARDT'S BUTT CREAM) CREA Apply 1 Application topically 2 (two) times daily. 08/12/23   Pokhrel, Vernal, MD  ondansetron  (ZOFRAN ) 4 MG tablet Take 1 tablet (4 mg total) by mouth every 6 (six) hours as needed for nausea. 08/12/23   Pokhrel, Vernal, MD  oxyCODONE  10 MG TABS Place 1 tablet (10 mg total) into feeding tube every 6 (six) hours as needed for moderate pain (pain score 4-6) or severe pain (pain score 7-10). 08/12/23 08/11/24  Pokhrel, Laxman, MD  oxyCODONE -acetaminophen  (PERCOCET) 10-325 MG tablet Take 1 tablet by mouth 4 (four) times daily as needed for pain. 08/02/23   [provider]  OZEMPIC, 1 MG/DOSE, 4 MG/3ML SOPN Inject 1 mg into the skin once a week. 04/30/23   [provider]  Protein (FEEDING SUPPLEMENT, PROSOURCE TF20,)  liquid Place 60 mLs into feeding tube daily. 08/13/23   Pokhrel, Laxman, MD  traZODone  (DESYREL ) 50 MG tablet Place 0.5 tablets (25 mg total) into feeding tube at bedtime as needed for sleep. 08/12/23   Pokhrel, Vernal, MD  Water  For Irrigation, Sterile (FREE WATER ) SOLN Place 100 mLs into feeding tube every 6 (six) hours. 08/12/23   Sonjia Vernal, MD    Physical Exam: Vitals:   02/21/24 1326 02/21/24 1457 02/21/24 1516 02/21/24 2118  BP: 99/70  108/86 124/83  Pulse: (!) 110 (!) 114 100 89  Resp: 18  16 17   Temp: 97.8 F (36.6 C)  97.8 F (36.6 C) 97.8 F (36.6 C)  TempSrc: Oral  Oral   SpO2: (!) 89% 95% 95% 96%    Physical Exam Vitals reviewed.  Constitutional:      General: He is not in acute distress. HENT:     Head: Normocephalic and atraumatic.  Eyes:     Extraocular Movements: Extraocular movements intact.  Cardiovascular:     Rate and Rhythm: Regular rhythm. Tachycardia present.     Heart sounds: Normal heart sounds.  Pulmonary:     Effort: Pulmonary effort is normal. No respiratory distress.     Breath sounds: Rhonchi present.  Abdominal:     General: Bowel sounds are normal. There is no distension.     Palpations: Abdomen is soft.     Tenderness: There is abdominal tenderness. There is no guarding.     Comments: Generalized tenderness to palpation  Musculoskeletal:     Cervical back: Normal range of motion.     Right lower leg: No edema.     Left lower leg: No edema.  Skin:    General: Skin is warm and dry.  Neurological:     General: No focal deficit present.     Mental Status: He is alert and oriented to person, place, and time.     Labs on Admission: I have personally reviewed following labs and imaging studies  CBC: Recent Labs  Lab 02/21/24 1525  WBC 17.1*  HGB 16.3  HCT 49.6  MCV 90.8  PLT 342   Basic Metabolic Panel: Recent Labs  Lab 02/21/24 1525  NA 138  K 4.6  CL 105  CO2 22  GLUCOSE 224*  BUN 23  CREATININE 1.12  CALCIUM  9.5    GFR: CrCl cannot be calculated (Unknown ideal weight.). Liver Function Tests: Recent Labs  Lab 02/21/24 1525  AST 14*  ALT 17  ALKPHOS 50  BILITOT 0.9  PROT 7.9  ALBUMIN 3.3*   No results for input(s): LIPASE, AMYLASE in the last 168 hours. No results for input(s): AMMONIA in the last 168 hours. Coagulation Profile: No results for input(s): INR, PROTIME in the last 168 hours. Cardiac Enzymes: No results for input(s): CKTOTAL, CKMB, CKMBINDEX, TROPONINI in the last 168 hours. BNP (last 3 results) No results for input(s): PROBNP in the last 8760 hours. HbA1C: No results for input(s): HGBA1C in the last 72 hours. CBG: Recent Labs  Lab 02/21/24 1524  GLUCAP 220*   Lipid Profile: No results for input(s): CHOL, HDL, LDLCALC, TRIG, CHOLHDL, LDLDIRECT in the last 72 hours. Thyroid Function Tests: No results for input(s): TSH, T4TOTAL, FREET4, T3FREE, THYROIDAB in the last 72 hours. Anemia Panel: No results for input(s): VITAMINB12, FOLATE, FERRITIN, TIBC, IRON, RETICCTPCT in the last 72 hours. Urine analysis:    Component Value Date/Time   COLORURINE YELLOW 08/03/2023 0300   APPEARANCEUR CLEAR 08/03/2023 0300   LABSPEC 1.010 08/03/2023 0300   PHURINE 5.0 08/03/2023 0300   GLUCOSEU >=500 (A) 08/03/2023 0300   HGBUR NEGATIVE 08/03/2023 0300   BILIRUBINUR NEGATIVE 08/03/2023 0300   KETONESUR 5 (A) 08/03/2023 0300   PROTEINUR 30 (A) 08/03/2023 0300   UROBILINOGEN 0.2 11/14/2014 1851   NITRITE NEGATIVE 08/03/2023 0300   LEUKOCYTESUR NEGATIVE 08/03/2023 0300    Radiological Exams on Admission: CT ABDOMEN PELVIS W CONTRAST Result Date: 02/21/2024 CLINICAL DATA:  Left upper quadrant abdominal pain. EXAM: CT ABDOMEN AND PELVIS WITH CONTRAST TECHNIQUE: Multidetector CT imaging of the abdomen and pelvis was performed using the standard protocol following bolus administration of intravenous contrast. RADIATION DOSE  REDUCTION: This exam was performed according to the departmental dose-optimization program which includes automated exposure control, adjustment of the mA and/or kV according to patient size and/or use of iterative reconstruction technique. CONTRAST:  75mL OMNIPAQUE  IOHEXOL  350 MG/ML SOLN COMPARISON:  CT abdomen pelvis dated 08/08/2023. FINDINGS: Evaluation of this exam is limited due to  respiratory motion. Lower chest: Scattered bibasilar tiny ground-glass densities, left greater right suspicious for pneumonia or aspiration. No intra-abdominal free air or free fluid. Hepatobiliary: The liver is unremarkable. There is biliary dilatation, post cholecystectomy. There is mild pneumobilia. Pancreas: Unremarkable. No pancreatic ductal dilatation or surrounding inflammatory changes. Spleen: Normal in size without focal abnormality. Adrenals/Urinary Tract: The adrenal glands unremarkable. There is no hydronephrosis on either side. The visualized ureters and urinary bladder probable Stomach/Bowel: There is moderate stool throughout the colon. There is no bowel obstruction or active inflammation. The appendix is not visualized with certainty. No inflammatory changes identified in the right lower quadrant. Vascular/Lymphatic: Moderate aortoiliac atherosclerotic disease. The IVC is unremarkable. No portal venous gas. There is no adenopathy. Reproductive: Prostatectomy. Other: None Musculoskeletal: Osteopenia with degenerative changes. Lower lumbar fusion. No acute osseous pathology. IMPRESSION: 1. No acute intra-abdominal or pelvic pathology. 2. Moderate colonic stool burden. No bowel obstruction. 3. Scattered bibasilar tiny ground-glass densities, left greater right suspicious for pneumonia or aspiration. 4.  Aortic Atherosclerosis (ICD10-I70.0). Electronically Signed   By: Vanetta Chou M.D.   On: 02/21/2024 17:07   DG Chest 1 View Result Date: 02/21/2024 EXAM: 1 VIEW(S) XRAY OF THE CHEST 02/21/2024 03:48:00 PM  COMPARISON: Sleep comparison 08/02/2023. CLINICAL HISTORY: SOB (shortness of breath); Cough. FINDINGS: LUNGS AND PLEURA: Hyperexpanded lungs with findings of emphysema. Patchy perihilar opacities in the left upper lung zone. Nodular opacities also noted in the right lung base, measuring up to 3.2 cm, worrisome for multifocal pneumonia. No pleural effusion. No pneumothorax. HEART AND MEDIASTINUM: No acute abnormality of the cardiac and mediastinal silhouettes. BONES AND SOFT TISSUES: Multilevel thoracic osteophytosis. No acute osseous abnormality. IMPRESSION: 1. Emphysema. Patchy perihilar airspace opacities in the left upper and right lower lung zones, suspicious for multifocal pneumonia. 2. One opacity in the right lung base measures 3.2 cm and appears more nodular. Follow-up chest radiograph in 6 to 12 weeks is recommended to document resolution and exclude developing neoplasm. Electronically signed by: Rogelia Myers MD 02/21/2024 03:56 PM EST RP Workstation: [id number]    Assessment and Plan  Sepsis secondary to multifocal pneumonia Patient is presenting with shortness of breath and hypoxemia.  SpO2 89% on room air, currently stable on 2 L [place].  Meets SIRS criteria with tachycardia and leukocytosis.  No hypotension.  Chest x-ray showing patchy perihilar airspace opacities in the left upper and right lower lung zones suspicious for multifocal pneumonia. One opacity in the right lung base measures 3.2 cm and appears more lobular. Radiologist recommending follow-up chest radiograph in 6 to 12 weeks to assess resolution and exclude developing neoplasm.  CT abdomen pelvis showing scattered bibasilar tiny ground glass densities, left greater than right, suspicious for pneumonia or aspiration.  Patient does have history of oropharyngeal dysphagia for which  he had previously required PEG tube placement.  COVID/influenza/RSV PCR negative.  Continue antibiotic coverage with Unasyn, gentle IV fluid hydration.  Blood  cultures ordered and check lactate.  MRSA PCR screen, strep pneumo/Legionella urinary antigens, sputum Gram stain and culture.  Trend WBC count.  Keep n.p.o., SLP eval.  Continue supplemental oxygen, wean as tolerated.  Abdominal pain secondary to constipation CT showing moderate colonic stool burden but no signs of bowel obstruction.  Continue scheduled MiraLAX , Senokot-S PRN.  Chronic HFpEF Last echo done in April 2025 showing EF 50 to 55%.  No signs of volume overload at this time.  COPD Not on home inhalers.  Xopenex neb PRN.  CAD EKG without acute ischemic changes  and not endorsing chest pain.  Paroxysmal A-fib On Eliquis   Hypertension  Hyperlipidemia  Insulin -dependent type 2 diabetes Poorly controlled -glucose in the 220s.  Hemoglobin A1c 9.4 in April 2025, repeat ordered.  Placed on sensitive sliding scale insulin  every 4 hours for now as patient is n.p.o./pending SLP eval.  Anxiety  Chronic pain syndrome  DVT prophylaxis: Patient is on chronic anticoagulation.  Continue Eliquis  after pharmacy med rec is done and pending SLP eval.  If he remains n.p.o., they will have to be started on IV heparin  in the morning. Code Status: Discussed CODE STATUS with the patient and he wishes to be FULL CODE. Family Communication: ***  Consults called: ***  Level of care: {Blank single:19197::Med-Surg,Telemetry bed,Progressive Care Unit,Step Down Unit} Admission status: *** Time Spent: 75+ minutes***  Editha Ram MD [facility]  If 7PM-7AM, please contact night-coverage [email or web address]  02/21/2024, 11:10 PM

## 2024-02-21 NOTE — ED Triage Notes (Signed)
 BIB family from home for NV, and elevated blood sugar. Mentions recent hospitalizations, as well as PEG tube placement and removal. Verbalizes sx ongoing for weeks/ months, since last visit. Alert, NAD, calm, interactive, resps e/u, sparse communication. Sitting in w/c. Family doing most of the communication.

## 2024-02-21 NOTE — ED Notes (Signed)
 Called CCMD

## 2024-02-21 NOTE — ED Triage Notes (Signed)
 Patient reports abd pain since peg tube was removed. Reports difficulty eating and staying nauseated.  Also complains of shortness of breath.  Patient requesting pain medication before being able to exit room.  Patient appears emaciated

## 2024-02-21 NOTE — ED Notes (Signed)
 Condom cath placed.

## 2024-02-21 NOTE — ED Notes (Signed)
 Pt placed on 2L [place]

## 2024-02-21 NOTE — ED Notes (Signed)
 Left pt up in bed with assist. Gave more warm blankets

## 2024-02-22 DIAGNOSIS — I480 Paroxysmal atrial fibrillation: Secondary | ICD-10-CM | POA: Diagnosis present

## 2024-02-22 DIAGNOSIS — I110 Hypertensive heart disease with heart failure: Secondary | ICD-10-CM | POA: Diagnosis present

## 2024-02-22 DIAGNOSIS — Z515 Encounter for palliative care: Secondary | ICD-10-CM | POA: Diagnosis not present

## 2024-02-22 DIAGNOSIS — Z1152 Encounter for screening for COVID-19: Secondary | ICD-10-CM | POA: Diagnosis not present

## 2024-02-22 DIAGNOSIS — R54 Age-related physical debility: Secondary | ICD-10-CM | POA: Diagnosis present

## 2024-02-22 DIAGNOSIS — J189 Pneumonia, unspecified organism: Secondary | ICD-10-CM | POA: Diagnosis present

## 2024-02-22 DIAGNOSIS — A419 Sepsis, unspecified organism: Secondary | ICD-10-CM

## 2024-02-22 DIAGNOSIS — E1165 Type 2 diabetes mellitus with hyperglycemia: Secondary | ICD-10-CM | POA: Diagnosis present

## 2024-02-22 DIAGNOSIS — I5032 Chronic diastolic (congestive) heart failure: Secondary | ICD-10-CM | POA: Diagnosis present

## 2024-02-22 DIAGNOSIS — J690 Pneumonitis due to inhalation of food and vomit: Secondary | ICD-10-CM | POA: Diagnosis present

## 2024-02-22 DIAGNOSIS — Z66 Do not resuscitate: Secondary | ICD-10-CM | POA: Diagnosis present

## 2024-02-22 DIAGNOSIS — R0902 Hypoxemia: Secondary | ICD-10-CM | POA: Diagnosis present

## 2024-02-22 DIAGNOSIS — K5901 Slow transit constipation: Secondary | ICD-10-CM

## 2024-02-22 DIAGNOSIS — Z7901 Long term (current) use of anticoagulants: Secondary | ICD-10-CM | POA: Diagnosis not present

## 2024-02-22 DIAGNOSIS — Z7189 Other specified counseling: Secondary | ICD-10-CM | POA: Diagnosis not present

## 2024-02-22 DIAGNOSIS — K5900 Constipation, unspecified: Secondary | ICD-10-CM

## 2024-02-22 DIAGNOSIS — Z794 Long term (current) use of insulin: Secondary | ICD-10-CM | POA: Diagnosis not present

## 2024-02-22 DIAGNOSIS — J188 Other pneumonia, unspecified organism: Secondary | ICD-10-CM | POA: Diagnosis not present

## 2024-02-22 DIAGNOSIS — J9601 Acute respiratory failure with hypoxia: Secondary | ICD-10-CM | POA: Diagnosis present

## 2024-02-22 DIAGNOSIS — I5030 Unspecified diastolic (congestive) heart failure: Secondary | ICD-10-CM

## 2024-02-22 DIAGNOSIS — Z681 Body mass index (BMI) 19 or less, adult: Secondary | ICD-10-CM | POA: Diagnosis not present

## 2024-02-22 DIAGNOSIS — R1310 Dysphagia, unspecified: Secondary | ICD-10-CM | POA: Diagnosis present

## 2024-02-22 DIAGNOSIS — J440 Chronic obstructive pulmonary disease with acute lower respiratory infection: Secondary | ICD-10-CM | POA: Diagnosis present

## 2024-02-22 DIAGNOSIS — Z7982 Long term (current) use of aspirin: Secondary | ICD-10-CM | POA: Diagnosis not present

## 2024-02-22 DIAGNOSIS — I2510 Atherosclerotic heart disease of native coronary artery without angina pectoris: Secondary | ICD-10-CM

## 2024-02-22 DIAGNOSIS — R64 Cachexia: Secondary | ICD-10-CM | POA: Diagnosis present

## 2024-02-22 DIAGNOSIS — E43 Unspecified severe protein-calorie malnutrition: Secondary | ICD-10-CM | POA: Diagnosis present

## 2024-02-22 DIAGNOSIS — I4891 Unspecified atrial fibrillation: Secondary | ICD-10-CM | POA: Insufficient documentation

## 2024-02-22 DIAGNOSIS — G894 Chronic pain syndrome: Secondary | ICD-10-CM | POA: Diagnosis present

## 2024-02-22 DIAGNOSIS — R6520 Severe sepsis without septic shock: Secondary | ICD-10-CM | POA: Diagnosis present

## 2024-02-22 DIAGNOSIS — E785 Hyperlipidemia, unspecified: Secondary | ICD-10-CM | POA: Diagnosis present

## 2024-02-22 LAB — CBC
HCT: 44.5 % (ref 39.0–52.0)
Hemoglobin: 15 g/dL (ref 13.0–17.0)
MCH: 30.2 pg (ref 26.0–34.0)
MCHC: 33.7 g/dL (ref 30.0–36.0)
MCV: 89.5 fL (ref 80.0–100.0)
Platelets: 322 K/uL (ref 150–400)
RBC: 4.97 MIL/uL (ref 4.22–5.81)
RDW: 13.9 % (ref 11.5–15.5)
WBC: 13.6 K/uL — ABNORMAL HIGH (ref 4.0–10.5)
nRBC: 0 % (ref 0.0–0.2)

## 2024-02-22 LAB — CBG MONITORING, ED
Glucose-Capillary: 212 mg/dL — ABNORMAL HIGH (ref 70–99)
Glucose-Capillary: 145 mg/dL — ABNORMAL HIGH (ref 70–99)

## 2024-02-22 LAB — HEMOGLOBIN A1C
Hgb A1c MFr Bld: 9.7 % — ABNORMAL HIGH (ref 4.8–5.6)
Mean Plasma Glucose: 231.69 mg/dL

## 2024-02-22 LAB — GLUCOSE, CAPILLARY
Glucose-Capillary: 163 mg/dL — ABNORMAL HIGH (ref 70–99)
Glucose-Capillary: 192 mg/dL — ABNORMAL HIGH (ref 70–99)
Glucose-Capillary: 175 mg/dL — ABNORMAL HIGH (ref 70–99)
Glucose-Capillary: 179 mg/dL — ABNORMAL HIGH (ref 70–99)

## 2024-02-22 LAB — LACTIC ACID, PLASMA: Lactic Acid, Venous: 0.9 mmol/L (ref 0.5–1.9)

## 2024-02-22 LAB — MRSA NEXT GEN BY PCR, NASAL: MRSA by PCR Next Gen: DETECTED — AB

## 2024-02-22 LAB — BRAIN NATRIURETIC PEPTIDE: B Natriuretic Peptide: 56.9 pg/mL (ref 0.0–100.0)

## 2024-02-22 MED ORDER — APIXABAN 5 MG PO TABS
5.0000 mg | ORAL_TABLET | Freq: Two times a day (BID) | ORAL | Status: DC
  Administered 2024-02-22 – 2024-02-24 (×6): 5 mg
  Filled 2024-02-22 (×6): qty 1

## 2024-02-22 MED ORDER — SENNOSIDES-DOCUSATE SODIUM 8.6-50 MG PO TABS
1.0000 | ORAL_TABLET | Freq: Two times a day (BID) | ORAL | Status: DC
  Administered 2024-02-22 – 2024-02-27 (×9): 1 via ORAL
  Filled 2024-02-22 (×9): qty 1

## 2024-02-22 MED ORDER — METOPROLOL TARTRATE 5 MG/5ML IV SOLN
INTRAVENOUS | Status: AC
  Filled 2024-02-22: qty 5

## 2024-02-22 MED ORDER — METOPROLOL TARTRATE 5 MG/5ML IV SOLN
5.0000 mg | Freq: Once | INTRAVENOUS | Status: AC
  Administered 2024-02-22: 5 mg via INTRAVENOUS

## 2024-02-22 MED ORDER — TRAZODONE HCL 50 MG PO TABS: 25.0000 mg | ORAL_TABLET | Freq: Every evening | ORAL | Status: DC | PRN

## 2024-02-22 MED ORDER — DILTIAZEM HCL 30 MG PO TABS
30.0000 mg | ORAL_TABLET | Freq: Four times a day (QID) | ORAL | Status: DC
  Administered 2024-02-22 – 2024-02-28 (×22): 30 mg via ORAL
  Filled 2024-02-22 (×24): qty 1

## 2024-02-22 MED ORDER — DILTIAZEM HCL 25 MG/5ML IV SOLN
10.0000 mg | Freq: Once | INTRAVENOUS | Status: DC
  Filled 2024-02-22: qty 5

## 2024-02-22 MED ORDER — ALPRAZOLAM 0.5 MG PO TABS
0.5000 mg | ORAL_TABLET | Freq: Three times a day (TID) | ORAL | Status: DC | PRN
  Administered 2024-02-23 – 2024-02-27 (×4): 0.5 mg via ORAL
  Filled 2024-02-22 (×4): qty 1

## 2024-02-22 MED ORDER — OXYCODONE HCL 5 MG PO TABS
10.0000 mg | ORAL_TABLET | Freq: Four times a day (QID) | ORAL | Status: DC | PRN
  Administered 2024-02-22 – 2024-02-23 (×4): 10 mg
  Filled 2024-02-22 (×5): qty 2

## 2024-02-22 NOTE — Progress Notes (Signed)
 Patient's HR continuing to sustain in 150s. Metoprolol  5mg  IV given at 1521. AT 1525 patient HR resolved to 80's - 90's.   Diltiazem  30mg  PO has been ordered q6h, and first dose is scheduled to start today at 1800.  Patient remained asymptomatic during episode of elevated HR. Patient now has HR 94 in NSR and  BP of 119/76 (88).   There is strong suspicion that onset of sustained HR was caused by coughing fit (aspiration) during meal time. Several quarter sized chunks of food were coughed up by the patient during event.

## 2024-02-22 NOTE — Progress Notes (Signed)
 Nursing staff give oral medication with applesauce.

## 2024-02-22 NOTE — ED Notes (Signed)
 Gave more warm blankets

## 2024-02-22 NOTE — Progress Notes (Signed)
 [facility]   Bed Availability Yes  Level of Care Needed:  Yes  MD Agree to transfer: N/A  Patient agree to transfer: Yes  Sharolyn Batman, RN [facility]

## 2024-02-22 NOTE — Progress Notes (Signed)
 Progress Note   Patient: Bruce Franklin FMW:[id number] DOB: [birth year] DOA: 02/21/2024     0 DOS: the patient was seen and examined on 02/22/2024   Brief hospital course: Bruce Franklin is a 78 y.o. male with medical history significant of COPD not on home oxygen, CAD, HFpEF, A-fib on Eliquis , hypertension, hyperlipidemia, insulin -dependent type 2 diabetes, prostate cancer, anxiety, tobacco abuse, chronic pain syndrome, oropharyngeal dysphagia underwent PEG tube placement in April 2025 and later improved and PEG tube was removed on 7/31.  Patient presents to the ED today with complaints of abdominal pain and shortness of breath.  Symptoms started about 3 days ago.  He is endorsing shortness of breath and productive cough.    He was hypoxic requiring 2 L [place], tachycardic. Labs with WBC count 17.1, COVID/influenza/RSV PCR negative. Chest x-ray showing patchy perihilar airspace opacities in the left upper and right lower lung zones suspicious for multifocal pneumonia. One opacity in the right lung base measures 3.2 cm and appears more lobular. Radiologist recommending follow-up chest radiograph in 6 to 12 weeks to assess resolution and exclude developing neoplasm.  CT abdomen pelvis negative for acute findings. Showing moderate colonic stool burden but no signs of bowel obstruction. Showing scattered bibasilar tiny ground glass densities, left greater than right, suspicious for pneumonia or aspiration. EKG showing sinus tachycardia, PACs, and no acute ischemic changes. Patient was given ceftriaxone , doxycycline , oxycodone , and MiraLAX  admitted to TRH service for further management.  Assessment and Plan: Sepsis Multifocal pneumonia Acute hypoxic respiratory failure High risk for aspiration- Patient presented with tachycardia, tachypnea, hypoxia, high WBC. Imaging with multifocal pneumonia, possibly aspiration related as he has dysphagia, had PEG April 2025 which was removed sept 2025 Continue  supplemental oxygen to maintain saturation greater than 92%. Continue Unasyn therapy for possible aspiration pneumonia. SLP evaluated, advised MBS tomorrow. Continue to monitor vitals closely.  A-fib with RVR- Patient does have history of paroxysmal A-fib not on any rate control medications. RVR in the setting of infection. Will give IV metoprolol  5 mg dose. Start Cardizem  30 mg Q6 hourly with holding parameters. Continue Eliquis  therapy.  Patient will need prescription outpatient.  Abdominal pain secondary to constipation- Patient will be continued on scheduled MiraLAX , senna, Colace. Advised patient to avoid opiate medications.  Chronic HFpEF- Last echo April 2025 EF 50 to 55%, No symptoms of volume overload.  Uncontrolled type 2 diabetes mellitus- Last A1c 9.15 July 2023. Continue Accu-Cheks, sliding scale insulin  Q4 hourly as patient is NPO.  CAD-no chest pain.  Continue aspirin, statin, beta-blocker.  Chronic pain syndrome- Patient is started on home oxycodone  therapy.  Severe malnutrition- Cachexia, BMI 18.49. Dietician evaluation.     Out of bed to chair. Incentive spirometry. Nursing supportive care. Fall, aspiration precautions. Diet:  Diet Orders (From admission, onward)     Start     Ordered   02/22/24 1105  Diet heart healthy/carb modified Room service appropriate? Yes with Assist; Fluid consistency: Thin  Diet effective now       Comments: Meds whole with puree  Question Answer Comment  Diet-HS Snack? Nothing   Room service appropriate? Yes with Assist   Fluid consistency: Thin      02/22/24 1105           DVT prophylaxis:  apixaban  (ELIQUIS ) tablet 5 mg  Level of care: Progressive   Code Status: Full Code  Subjective: Patient is seen and examined today morning. He wishes to eat. Able to answer in one word. Poor  historian. This afternoon he went into rapid Afib with HR around 150. RN notified he has no symptoms. IV beta blocker  ordered. Rechecked on him at 430pm HR improved to 90. No family at bedside.  Physical Exam: Vitals:   02/22/24 1219 02/22/24 1451 02/22/24 1459 02/22/24 1512  BP:  (!) 146/83 129/85   Pulse: 100 (!) 154 (!) 148 (!) 146  Resp:      Temp:      TempSrc:      SpO2: 92% 100% 96% 97%  Weight:      Height:        General - Elderly thin built Caucasian male, in respiratory distress HEENT - PERRLA, EOMI, atraumatic head, non tender sinuses. Lung - decreased breath sounds, diffuse rales, rhonchi, no wheezes. Heart - S1, S2 heard, no murmurs, rubs, no pedal edema. Abdomen - Soft, non tender, bowel sounds good Neuro - Alert, awake and oriented x 3, non focal exam. Skin - Warm and dry.  Data Reviewed:      Latest Ref Rng & Units 02/22/2024    2:08 AM 02/21/2024    3:25 PM 08/12/2023    3:40 AM  CBC  WBC 4.0 - 10.5 K/uL 13.6  17.1  9.9   Hemoglobin 13.0 - 17.0 g/dL 84.9  83.6  85.1   Hematocrit 39.0 - 52.0 % 44.5  49.6  44.2   Platelets 150 - 400 K/uL 322  342  273       Latest Ref Rng & Units 02/21/2024    3:25 PM 08/12/2023    3:40 AM 08/11/2023    3:04 AM  BMP  Glucose 70 - 99 mg/dL 775  835  855   BUN 8 - 23 mg/dL 23  23  17    Creatinine 0.61 - 1.24 mg/dL 8.87  9.20  9.30   Sodium 135 - 145 mmol/L 138  138  138   Potassium 3.5 - 5.1 mmol/L 4.6  4.0  4.0   Chloride 98 - 111 mmol/L 105  105  105   CO2 22 - 32 mmol/L 22  24  21    Calcium  8.9 - 10.3 mg/dL 9.5  9.0  9.2    CT ABDOMEN PELVIS W CONTRAST Result Date: 02/21/2024 CLINICAL DATA:  Left upper quadrant abdominal pain. EXAM: CT ABDOMEN AND PELVIS WITH CONTRAST TECHNIQUE: Multidetector CT imaging of the abdomen and pelvis was performed using the standard protocol following bolus administration of intravenous contrast. RADIATION DOSE REDUCTION: This exam was performed according to the departmental dose-optimization program which includes automated exposure control, adjustment of the mA and/or kV according to patient size and/or  use of iterative reconstruction technique. CONTRAST:  75mL OMNIPAQUE  IOHEXOL  350 MG/ML SOLN COMPARISON:  CT abdomen pelvis dated 08/08/2023. FINDINGS: Evaluation of this exam is limited due to respiratory motion. Lower chest: Scattered bibasilar tiny ground-glass densities, left greater right suspicious for pneumonia or aspiration. No intra-abdominal free air or free fluid. Hepatobiliary: The liver is unremarkable. There is biliary dilatation, post cholecystectomy. There is mild pneumobilia. Pancreas: Unremarkable. No pancreatic ductal dilatation or surrounding inflammatory changes. Spleen: Normal in size without focal abnormality. Adrenals/Urinary Tract: The adrenal glands unremarkable. There is no hydronephrosis on either side. The visualized ureters and urinary bladder probable Stomach/Bowel: There is moderate stool throughout the colon. There is no bowel obstruction or active inflammation. The appendix is not visualized with certainty. No inflammatory changes identified in the right lower quadrant. Vascular/Lymphatic: Moderate aortoiliac atherosclerotic disease. The IVC is unremarkable. No portal  venous gas. There is no adenopathy. Reproductive: Prostatectomy. Other: None Musculoskeletal: Osteopenia with degenerative changes. Lower lumbar fusion. No acute osseous pathology. IMPRESSION: 1. No acute intra-abdominal or pelvic pathology. 2. Moderate colonic stool burden. No bowel obstruction. 3. Scattered bibasilar tiny ground-glass densities, left greater right suspicious for pneumonia or aspiration. 4.  Aortic Atherosclerosis (ICD10-I70.0). Electronically Signed   By: Vanetta Chou M.D.   On: 02/21/2024 17:07   DG Chest 1 View Result Date: 02/21/2024 EXAM: 1 VIEW(S) XRAY OF THE CHEST 02/21/2024 03:48:00 PM COMPARISON: Sleep comparison 08/02/2023. CLINICAL HISTORY: SOB (shortness of breath); Cough. FINDINGS: LUNGS AND PLEURA: Hyperexpanded lungs with findings of emphysema. Patchy perihilar opacities in the  left upper lung zone. Nodular opacities also noted in the right lung base, measuring up to 3.2 cm, worrisome for multifocal pneumonia. No pleural effusion. No pneumothorax. HEART AND MEDIASTINUM: No acute abnormality of the cardiac and mediastinal silhouettes. BONES AND SOFT TISSUES: Multilevel thoracic osteophytosis. No acute osseous abnormality. IMPRESSION: 1. Emphysema. Patchy perihilar airspace opacities in the left upper and right lower lung zones, suspicious for multifocal pneumonia. 2. One opacity in the right lung base measures 3.2 cm and appears more nodular. Follow-up chest radiograph in 6 to 12 weeks is recommended to document resolution and exclude developing neoplasm. Electronically signed by: Rogelia Myers MD 02/21/2024 03:56 PM EST RP Workstation: [id number]    Family Communication: Discussed with patient, wife over phone. They understand and agree. All questions answered.  Disposition: Status is: Inpatient Remains inpatient appropriate because: IV antibiotics, SLP study, afib RVR  Planned Discharge Destination: Home with Home Health     Time spent: 52 minutes  Author: Concepcion Riser, MD 02/22/2024 3:19 PM Secure chat 7am to 7pm For on call review [email or web address].

## 2024-02-22 NOTE — Inpatient Diabetes Management (Signed)
 Inpatient Diabetes Program Recommendations  AACE/ADA: New Consensus Statement on Inpatient Glycemic Control (2015)  Target Ranges:  Prepandial:   less than 140 mg/dL      Peak postprandial:   less than 180 mg/dL (1-2 hours)      Critically ill patients:  140 - 180 mg/dL   Lab Results  Component Value Date   GLUCAP 145 (H) 02/22/2024   HGBA1C 9.7 (H) 02/22/2024    Review of Glycemic Control  Latest Reference Range & Units 02/21/24 15:24 02/22/24 01:16 02/22/24 08:45  Glucose-Capillary 70 - 99 mg/dL 779 (H) 787 (H) 854 (H)   Diabetes history: DM 2 Outpatient Diabetes medications: Ozempic, metformin  in the past Current orders for Inpatient glycemic control:  Novolog  0-9 units Q4 hours  A1c 9.7% on 02/22/2024 A1c 9.4% on 08/03/2023  Wife assist with medications at home will need to follow up with her. Pt was on low dose basal insulin  last hospitalization. Note Pt listed as being on a GLP-1 at home.  Will follow glucose trends for now to determine needs at discharge.   Thanks,  Clotilda Bull RN, MSN, BC-ADM Inpatient Diabetes Coordinator Team Pager [phone] (8a-5p)

## 2024-02-22 NOTE — Evaluation (Signed)
 Clinical/Bedside Swallow Evaluation Patient Details  Name: Bruce Franklin MRN: [id number] Date of Birth: Feb 14, 1946  Today's Date: 02/22/2024 Time: SLP Start Time (ACUTE ONLY): 1044 SLP Stop Time (ACUTE ONLY): 1105 SLP Time Calculation (min) (ACUTE ONLY): 21 min  Past Medical History:  Past Medical History:  Diagnosis Date   Acute biliary pancreatitis    Anxiety    Cholelithiasis    COPD (chronic obstructive pulmonary disease) (HCC)    Diabetes mellitus without complication (HCC)    Pneumonia    Prostate cancer (HCC)    Smokes 1 pack of cigarettes per day    Past Surgical History:  Past Surgical History:  Procedure Laterality Date   CHOLECYSTECTOMY N/A 04/01/2016   Procedure: LAPAROSCOPIC CHOLECYSTECTOMY;  Surgeon: Mitzie DELENA Freund, MD;  Location: [facility] OR;  Service: General;  Laterality: N/A;   ERCP N/A 03/30/2016   Procedure: ENDOSCOPIC RETROGRADE CHOLANGIOPANCREATOGRAPHY (ERCP);  Surgeon: Toribio SHAUNNA Cedar, MD;  Location: [facility] ENDOSCOPY;  Service: Endoscopy;  Laterality: N/A;   IR GASTROSTOMY TUBE MOD SED  08/10/2023   LUMBAR FUSION     PROSTATE SURGERY     ROTATOR CUFF REPAIR     HPI:  Bruce Franklin is a 78 y.o. male admitted 02/21/24 with sepsis, multifocal pna, suspected aspiration (vomiting earlier in the week), acute respiratory failure. PMHx COPD not on home oxygen, CAD, HFpEF, A-fib on Eliquis , hypertension, hyperlipidemia, insulin -dependent type 2 diabetes, prostate cancer, anxiety, tobacco abuse, chronic pain syndrome, oropharyngeal dysphagia -PEG tube 08/10/23; removed 7/31.  MBS 08/05/23 and 08/08/23 with significant dysphagia, DIGEST score of 3 severe dysfunction. Aspiration occurred due to mistiming of the swallow, altered mentation.    Assessment / Plan / Recommendation  Clinical Impression  Pt seen subsequent to nursing report of coughing/delayed swallow after drinking water .  During assessment, he presented with functional swallowing with no consistent concerns for  aspiration. There was, however, intermittent, mild baseline coughing that occurred before and during assessment. Difficult to discern cough related to pna vs potential aspiration.  Oral mechanism exam was normal.  Pt is edentulous; rarely wears dentures per his report.  He demonstrated thorough mastication of solids, good appetite, self-fed without difficulty.  Recommend resuming a regular diet/thin liquids for today.  Will f/u next date and potentially schedule MBS to assess physiology. D/W MD and RN.   SLP Visit Diagnosis: Dysphagia, unspecified (R13.10)    Aspiration Risk    tba   Diet Recommendation   Thin;Age appropriate regular  Medication Administration: Whole meds with puree    Other  Recommendations Oral Care Recommendations: Oral care BID     Assistance Recommended at Discharge    Functional Status Assessment    Frequency and Duration min 1 x/week  1 week       Prognosis Prognosis for improved oropharyngeal function: Good      Swallow Study   General Date of Onset: 02/21/24 HPI: Bruce Franklin is a 78 y.o. male admitted 02/21/24 with sepsis, multifocal pna, suspected aspiration (vomiting earlier in the week), acute respiratory failure. PMHx COPD not on home oxygen, CAD, HFpEF, A-fib on Eliquis , hypertension, hyperlipidemia, insulin -dependent type 2 diabetes, prostate cancer, anxiety, tobacco abuse, chronic pain syndrome, oropharyngeal dysphagia -PEG tube 08/10/23; removed 7/31.  MBS 08/05/23 and 08/08/23 with significant dysphagia, DIGEST score of 3 severe dysfunction. Aspiration occurred due to mistiming of the swallow, altered mentation. Type of Study: Bedside Swallow Evaluation Previous Swallow Assessment: see HPI Diet Prior to this Study: NPO Temperature Spikes Noted: No Respiratory Status: Nasal  cannula History of Recent Intubation: No Behavior/Cognition: Alert;Cooperative Oral Cavity Assessment: Within Functional Limits Oral Care Completed by SLP: Yes Oral Cavity -  Dentition: Edentulous Vision: Functional for self-feeding Self-Feeding Abilities: Able to feed self Patient Positioning: Upright in bed Baseline Vocal Quality: Normal Volitional Cough: Congested Volitional Swallow: Able to elicit    Oral/Motor/Sensory Function Overall Oral Motor/Sensory Function: Within functional limits   Ice Chips Ice chips: Within functional limits   Thin Liquid Thin Liquid: Within functional limits    Nectar Thick Nectar Thick Liquid: Not tested   Honey Thick Honey Thick Liquid: Not tested   Puree Puree: Within functional limits   Solid     Solid: Within functional limits      Bruce Franklin 02/22/2024,11:57 AM

## 2024-02-22 NOTE — ED Notes (Signed)
 Labs sent

## 2024-02-22 NOTE — Progress Notes (Signed)
 Patient does not swallow thin liquids (water ) safely. Upon consuming water  patient dribbled water  out of closed lips, held water  in mouth, delayed swallowing, resulting in a strong moist cough. Patient safely swallowed Eliquis  whole with apple sauce. .MD made aware. SLP eval ordered.

## 2024-02-22 NOTE — Progress Notes (Signed)
 Telemetry called RN, made RN aware that patients HR is sustaining in 150's.  MD paged and secure chat sent.

## 2024-02-23 ENCOUNTER — Inpatient Hospital Stay (HOSPITAL_COMMUNITY)

## 2024-02-23 DIAGNOSIS — K5901 Slow transit constipation: Secondary | ICD-10-CM | POA: Diagnosis not present

## 2024-02-23 DIAGNOSIS — Z7189 Other specified counseling: Secondary | ICD-10-CM

## 2024-02-23 DIAGNOSIS — J188 Other pneumonia, unspecified organism: Secondary | ICD-10-CM | POA: Diagnosis not present

## 2024-02-23 DIAGNOSIS — K5900 Constipation, unspecified: Secondary | ICD-10-CM

## 2024-02-23 DIAGNOSIS — Z515 Encounter for palliative care: Secondary | ICD-10-CM

## 2024-02-23 DIAGNOSIS — I5030 Unspecified diastolic (congestive) heart failure: Secondary | ICD-10-CM

## 2024-02-23 DIAGNOSIS — R0902 Hypoxemia: Secondary | ICD-10-CM

## 2024-02-23 DIAGNOSIS — I2510 Atherosclerotic heart disease of native coronary artery without angina pectoris: Secondary | ICD-10-CM | POA: Diagnosis not present

## 2024-02-23 DIAGNOSIS — J189 Pneumonia, unspecified organism: Secondary | ICD-10-CM

## 2024-02-23 DIAGNOSIS — A419 Sepsis, unspecified organism: Secondary | ICD-10-CM | POA: Diagnosis not present

## 2024-02-23 LAB — CBC
HCT: 42.5 % (ref 39.0–52.0)
Hemoglobin: 14.3 g/dL (ref 13.0–17.0)
MCH: 30 pg (ref 26.0–34.0)
MCHC: 33.6 g/dL (ref 30.0–36.0)
MCV: 89.1 fL (ref 80.0–100.0)
Platelets: 275 K/uL (ref 150–400)
RBC: 4.77 MIL/uL (ref 4.22–5.81)
RDW: 13.3 % (ref 11.5–15.5)
WBC: 10.8 K/uL — ABNORMAL HIGH (ref 4.0–10.5)
nRBC: 0 % (ref 0.0–0.2)

## 2024-02-23 LAB — BASIC METABOLIC PANEL WITH GFR
Anion gap: 8 (ref 5–15)
BUN: 15 mg/dL (ref 8–23)
CO2: 18 mmol/L — ABNORMAL LOW (ref 22–32)
Calcium: 8.4 mg/dL — ABNORMAL LOW (ref 8.9–10.3)
Chloride: 108 mmol/L (ref 98–111)
Creatinine, Ser: 0.78 mg/dL (ref 0.61–1.24)
GFR, Estimated: >60 mL/min (ref >60)
Glucose, Bld: 177 mg/dL — ABNORMAL HIGH (ref 70–99)
Potassium: 3.7 mmol/L (ref 3.5–5.1)
Sodium: 134 mmol/L — ABNORMAL LOW (ref 135–145)

## 2024-02-23 LAB — GLUCOSE, CAPILLARY
Glucose-Capillary: 176 mg/dL — ABNORMAL HIGH (ref 70–99)
Glucose-Capillary: 139 mg/dL — ABNORMAL HIGH (ref 70–99)
Glucose-Capillary: 163 mg/dL — ABNORMAL HIGH (ref 70–99)
Glucose-Capillary: 121 mg/dL — ABNORMAL HIGH (ref 70–99)
Glucose-Capillary: 135 mg/dL — ABNORMAL HIGH (ref 70–99)

## 2024-02-23 MED ORDER — PROCHLORPERAZINE EDISYLATE 10 MG/2ML IJ SOLN
5.0000 mg | Freq: Four times a day (QID) | INTRAMUSCULAR | Status: DC | PRN
  Administered 2024-02-26 – 2024-02-28 (×2): 5 mg via INTRAVENOUS
  Filled 2024-02-23 (×2): qty 2

## 2024-02-23 MED ORDER — INSULIN ASPART 100 UNIT/ML IJ SOLN
0.0000 [IU] | Freq: Three times a day (TID) | INTRAMUSCULAR | Status: DC
  Administered 2024-02-23: 2 [IU] via SUBCUTANEOUS
  Administered 2024-02-23 – 2024-02-24 (×5): 1 [IU] via SUBCUTANEOUS
  Administered 2024-02-25: 3 [IU] via SUBCUTANEOUS
  Administered 2024-02-25 – 2024-02-27 (×6): 2 [IU] via SUBCUTANEOUS
  Filled 2024-02-23 (×4): qty 1
  Filled 2024-02-23 (×3): qty 2
  Filled 2024-02-23 (×2): qty 3
  Filled 2024-02-23: qty 1
  Filled 2024-02-23: qty 2

## 2024-02-23 MED ORDER — CHLORHEXIDINE GLUCONATE CLOTH 2 % EX PADS
6.0000 | MEDICATED_PAD | Freq: Every day | CUTANEOUS | Status: AC
  Administered 2024-02-23 – 2024-02-27 (×5): 6 via TOPICAL

## 2024-02-23 MED ORDER — LACTATED RINGERS IV SOLN: INTRAVENOUS | Status: AC

## 2024-02-23 MED ORDER — INSULIN ASPART 100 UNIT/ML IJ SOLN
0.0000 [IU] | Freq: Every day | INTRAMUSCULAR | Status: DC
  Administered 2024-02-27: 2 [IU] via SUBCUTANEOUS
  Filled 2024-02-23: qty 2

## 2024-02-23 MED ORDER — MUPIROCIN 2 % EX OINT
TOPICAL_OINTMENT | Freq: Two times a day (BID) | CUTANEOUS | Status: DC
  Filled 2024-02-23: qty 22

## 2024-02-23 NOTE — Evaluation (Signed)
 Modified Barium Swallow Study  Patient Details  Name: Bruce Franklin MRN: [id number] Date of Birth: Dec 25, 1945  Today's Date: 02/23/2024  Modified Barium Swallow completed.  Full report located under Chart Review in the Imaging Section.  History of Present Illness Bruce Franklin is a 78 y.o. male admitted 02/21/24 with sepsis, multifocal pna, suspected aspiration (vomiting earlier in the week), acute respiratory failure. PMHx COPD not on home oxygen, CAD, HFpEF, A-fib on Eliquis , hypertension, hyperlipidemia, insulin -dependent type 2 diabetes, prostate cancer, anxiety, tobacco abuse, chronic pain syndrome, oropharyngeal dysphagia -PEG tube 08/10/23; removed 7/31.  MBS 08/05/23 and 08/08/23 with significant dysphagia, DIGEST score of 3 severe dysfunction. Aspiration occurred due to mistiming of the swallow, altered mentation.   Clinical Impression  Pt presents with a primary pharyngeal dysphagia that is similar to findings from last MBS in April of 2025.  He intermittently aspirated thin and nectar thick liquids due to delays in onset of the swallow response.    Aspiration was not consistently accompanied by a cough. When pt coughed, it was not protective in nature.  Pt had difficulty following instructions to limit bolus size and to tuck chin (chin tuck, when implemented correctly, did not fully protect the airway). There was vallecular residue that collected with solid consistencies - spontaneous sub-swallows helped to clear.    Pt has made clear to staff that he does not want another PEG.  I spoke with his son at the bedside after the study, showed him images of the aspiration, and explained that aspiration will occur intermittently despite best efforts to prevent it.  He has tolerated aspiration in varying degrees over the course of the last six months, but now is more susceptible to getting sick as a result (admitted with pna). D/W Dr. Darci; Bruce Franklin may benefit from Palliative Care consult.   SLP will follow.  Continue dysphagia 3/thin liquids with small sips; pt will cough when eating/drinking.    Factors that may increase risk of adverse event in presence of aspiration Noe & Lianne 2021): Reduced cognitive function;Frail or deconditioned;Weak cough;Aspiration of thick, dense, and/or acidic materials;Frequent aspiration of large volumes  Swallow Evaluation Recommendations Recommendations: PO diet PO Diet Recommendation: Dysphagia 3 (Mechanical soft);Thin liquids (Level 0) Liquid Administration via: Cup;Straw Medication Administration: Whole meds with puree Supervision: Staff to assist with self-feeding Swallowing strategies  : Small bites/sips;Slow rate Postural changes: Position pt fully upright for meals Oral care recommendations: Oral care BID (2x/day) Recommended consults: Consider Palliative care     Dillian Feig L. [place], MA CCC/SLP Clinical Specialist - Acute Care SLP Acute Rehabilitation Services Office number [phone]  [name] 02/23/2024,11:16 AM

## 2024-02-23 NOTE — Progress Notes (Addendum)
 Patient attempted lunch under supervision of staff member. Despite supervision and controlled feedings, the patient aspirated. MD and SLP made aware. Patient made NPO (refer to order), pending palliative's input.   NPO sign posted on patient room door.

## 2024-02-23 NOTE — Plan of Care (Signed)
  Problem: Skin Integrity: Goal: Risk for impaired skin integrity will decrease Outcome: Progressing   Problem: Tissue Perfusion: Goal: Adequacy of tissue perfusion will improve Outcome: Progressing   

## 2024-02-23 NOTE — Consult Note (Signed)
 Palliative Care Consult Note                                  Date: 02/23/2024   Patient Name: Bruce Franklin  DOB: [birth year]  MRN: [id number]  Age / Sex: 78 y.o., male  PCP: Dalbert Standing, MD Referring Physician: Darci Pore, MD  Reason for Consultation: Establishing goals of care  Past Medical History:  Diagnosis Date   Acute biliary pancreatitis    Anxiety    Cholelithiasis    COPD (chronic obstructive pulmonary disease) (HCC)    Diabetes mellitus without complication (HCC)    Pneumonia    Prostate cancer (HCC)    Smokes 1 pack of cigarettes per day     Subjective:   This NP Camellia Kays reviewed medical records, received report from team, assessed the patient and then meet at the patient's bedside to discuss diagnosis, prognosis, GOC, EOL wishes disposition and options.  Before meeting with the patient/family, I spent time reviewing the chart notes including admission H&P from two days ago, nursing notes from yesterday, SLP note from yesterday, internal medicine note from yesterday, SLP evaluation/MBS from today, nursing home today, hospitalist note from today. I also reviewed vital signs, nursing flowsheets, medication administrations record, labs, and imaging. Labs reviewed include CBC from today which shows improvement in leukocytosis from 17.1 two days ago to 13.6 yesterday to 10.8 today in the setting of sepsis from pneumonia on antibiotics and treatment.   I met with the patient at the bedside, no family was present.  After seeing the patient I reached out and called his wife Bruce Franklin.   We meet to discuss diagnosis prognosis, GOC, EOL wishes, disposition and options. Concept of Palliative Care was introduced as specialized medical care for people and their families living with serious illness.  If focuses on providing relief from the symptoms and stress of a serious illness.  The goal is to improve quality of  life for both the patient and the family. Values and goals of care important to patient and family were attempted to be elicited.  Created space and opportunity for patient  and family to explore thoughts and feelings regarding current medical situation   Natural trajectory and current clinical status were discussed. Questions and concerns addressed. Patient  encouraged to call with questions or concerns.    Patient/Family Understanding of Illness: The patient is a bit confused today and unable to speak much about his current situation.  For example, he does not remember talking about feeding tubes today.  His wife understands that he has severe swallowing problems and was offered if PEG tube.  She agrees that he would not want a PEG tube, previously had one.  When I was talking with the patient he did note that he did have a previous feeding tube and did not like it.  We will discuss further details about his clinical situation when we meet tomorrow during family meeting.  Life Review: Deferred  Patient Values: Deferred  Baseline Status: Deferred  Today's Discussion: Today when I spoke with the patient he denies dyspnea, nausea, vomiting.  He is having some pain in his butt.  Noted history of chronic pain syndrome, on home pain medications.  He is able to tell me that he feels bad, and mentions this multiple times.  We talked about having pneumonia likely because of swallowing problems.  I asked that if you are  ever talking about possible feeding tube today and he does not.  We talked about having a feeding tube previously and he states he did not like it.  He again keeps telling me that he does not feel good, feels poorly.  I do not feel that he likely understands what is going on enough to be able to have a good goals of care conversation.  I asked that I can call his wife and he says yes.  He does tell me how much she loves her and that they have been married for 67 years.  I shared that I  would come check on him tomorrow.  When I called his wife introduced palliative medicine and our role on the healthcare team.  I discussed that we were consulted to help talk through options on how to proceed with care.  She agrees that he does not want a PEG tube.  We talked about the patient unable to understand his situation and she states that his confusion has been getting worse lately.  She provided a substantial amount of information on his illness leading up to admission and our conversations about care.  We talked about benefit of a sit down goals of care meeting.  She states that she was at the hospital earlier and will be back tomorrow.  We scheduled a time to meet at 1:00 at the patient's bedside talk about options for how to proceed with care given desire for no feeding tube.    I provided our contact information and will meet with her tomorrow at 1:00 PM. I provided emotional and general support through therapeutic listening, empathy, sharing of stories, and other techniques. I answered all questions and addressed all concerns to the best of my ability.  Goals: To be determined at family/GOC meeting  Review of Systems  Constitutional:        Admits buttock pain, no other pain  Respiratory:  Negative for shortness of breath.   Cardiovascular:  Negative for chest pain.  Gastrointestinal:  Negative for abdominal pain, nausea and vomiting.    Objective:   Primary Diagnoses: Present on Admission:  Multifocal pneumonia  Sepsis (HCC)  CAD (coronary artery disease)   Vital Signs:  BP 137/62 (BP Location: Left Arm)   Pulse 75   Temp 97.7 F (36.5 C) (Oral)   Resp 18   Ht 6' 3 (1.905 m)   Wt 67.1 kg Comment: from July 2025 records  SpO2 94%   BMI 18.49 kg/m   Physical Exam Vitals and nursing note reviewed.  Constitutional:      General: He is not in acute distress.    Appearance: He is ill-appearing.  HENT:     Head: Normocephalic and atraumatic.  Cardiovascular:      Rate and Rhythm: Normal rate.  Pulmonary:     Effort: Pulmonary effort is normal. No respiratory distress.  Abdominal:     General: Abdomen is flat.  Neurological:     Mental Status: He is alert.     Comments: Confusion versus memory loss; dysarthric speech     Palliative Assessment/Data: 10%   Assessment & Plan:   HPI/Patient Profile: 78 y.o. male  with past medical history of COPD not on home oxygen, CAD, HFpEF, A-fib on Eliquis , hypertension, hyperlipidemia, insulin -dependent type 2 diabetes, prostate cancer, anxiety, tobacco abuse, chronic pain syndrome, oropharyngeal dysphagia underwent PEG tube placement in April 2025 and later improved and PEG tube was removed on 7/31. Patient presents to the ED today  with complaints of abdominal pain and shortness of breath that started 3 days ago.  He was admitted on 02/21/2024 with sepsis, multifocal pneumonia, acute hypoxic respiratory failure, high risk for aspiration, A-fib with RVR, abdominal pain secondary to constipation, chronic HFpEF, uncontrolled type 2 diabetes, severe malnutrition, and others.   Palliative medicine was consulted for GOC conversations.  SUMMARY OF RECOMMENDATIONS   Full code Full scope of care Plan family meeting tomorrow at 1:00 PM Palliative medicine will continue to follow  Symptom Management:  Per primary team Palliative medicine is available to assist as needed  Code Status: Full Code  Prognosis:  Unable to determine  Discharge Planning:  To Be Determined   Discussed with: Patient, family, medical team, nursing team, [facility] team    Thank you for allowing us  to participate in the care of RASHAAN WYLES PMT will continue to support holistically.  Billing based on MDM: Moderate  Detailed review of medical records (labs, imaging, vital signs), medically appropriate exam, discussed with treatment team, counseling and education to patient, family, & staff, documenting clinical information, medication  management, coordination of care  Signed by: Camellia Kays, NP Palliative Medicine Team  Team Phone # [phone] (Nights/Weekends)  02/23/2024, 4:11 PM

## 2024-02-23 NOTE — Progress Notes (Signed)
 Progress Note   Patient: Bruce Franklin FMW:[id number] DOB: [birth year] DOA: 02/21/2024     1 DOS: the patient was seen and examined on 02/23/2024   Brief hospital course: Bruce Franklin is a 78 y.o. male with medical history significant of COPD not on home oxygen, CAD, HFpEF, A-fib on Eliquis , hypertension, hyperlipidemia, insulin -dependent type 2 diabetes, prostate cancer, anxiety, tobacco abuse, chronic pain syndrome, oropharyngeal dysphagia underwent PEG tube placement in April 2025 and later improved and PEG tube was removed on 7/31.  Patient presents to the ED today with complaints of abdominal pain and shortness of breath.  Symptoms started about 3 days ago.  He is endorsing shortness of breath and productive cough.    He was hypoxic requiring 2 L [place], tachycardic. Labs with WBC count 17.1, COVID/influenza/RSV PCR negative. Chest x-ray showing patchy perihilar airspace opacities in the left upper and right lower lung zones suspicious for multifocal pneumonia. One opacity in the right lung base measures 3.2 cm and appears more lobular. Radiologist recommending follow-up chest radiograph in 6 to 12 weeks to assess resolution and exclude developing neoplasm.  CT abdomen pelvis negative for acute findings. Showing moderate colonic stool burden but no signs of bowel obstruction. Showing scattered bibasilar tiny ground glass densities, left greater than right, suspicious for pneumonia or aspiration. EKG showing sinus tachycardia, PACs, and no acute ischemic changes. Patient was given ceftriaxone , doxycycline , oxycodone , and MiraLAX  admitted to [facility] service for further management.  Patient had MBS 02/23/24, intermittent aspiration seen. Allowed dysphagia diet with small sips.   Assessment and Plan: Sepsis Multifocal pneumonia Acute hypoxic respiratory failure High risk for aspiration- Patient presented with tachycardia, tachypnea, hypoxia, high WBC. Imaging with multifocal pneumonia, possibly  aspiration related as he has dysphagia, had PEG which was removed few months ago. Continue supplemental oxygen to maintain saturation greater than 92%. Continue Unasyn therapy for possible aspiration pneumonia. SLP evaluated, MBS showed intermittent aspiration. He and wife does not want PEG tube. Advised palliative consultation for goals of care discussion. Due to concern of aspiration, he is NPO for now. Continue to monitor vitals closely.  A-fib with RVR- Patient does have history of paroxysmal A-fib not on any rate control medications. RVR in the setting of aspiration episodes.  Continue Cardizem  30 mg Q6 hourly with holding parameters. Continue Eliquis  therapy.    Abdominal pain secondary to constipation- Patient will be continued on scheduled MiraLAX , senna, Colace. Advised to avoid opiate medications.  Chronic HFpEF- Last echo April 2025 EF 50 to 55%, No symptoms of volume overload.  Uncontrolled type 2 diabetes mellitus- Last A1c 9.15 July 2023. Continue Accu-Cheks, sliding scale insulin  Q4 hourly as patient is NPO. He will need insulin  regimen upon discharge. Stop oral hypoglycemics.  CAD-no chest pain.  Continue aspirin, statin, beta-blocker.  Chronic pain syndrome- Patient is started on home oxycodone  therapy.  Severe malnutrition- Cachexia, BMI 18.49. Advised to stop Ozempic therapy. Discussed with wife regarding poor prognosis. He is willing to take risk of aspiration, advised DNR, comfort measures at home.     Out of bed to chair. Incentive spirometry. Nursing supportive care. Fall, aspiration precautions. Diet:  Diet Orders (From admission, onward)     Start     Ordered   02/23/24 1326  Diet NPO time specified Except for: Other (See Comments)  Diet effective now       Comments: Meds whole in puree (applesauce). Bruce have small sips of water , under staff supervision. No drinks and no food is to  be left in the patients room unattended.  Question:  Except for   Answer:  Other (See Comments)   02/23/24 1327           DVT prophylaxis:  apixaban  (ELIQUIS ) tablet 5 mg  Level of care: Progressive   Code Status: Full Code  Subjective: Patient is seen and examined today morning after MBS. Wife and son at bedside. Discussed his risk of aspiration, agreed with palliative consult. He wishes to eat.  Physical Exam: Vitals:   02/23/24 0801 02/23/24 0835 02/23/24 1105 02/23/24 1200  BP: 123/64  121/65 137/62  Pulse: 91 85 82 75  Resp: 18 18 18 18   Temp: 98 F (36.7 C)   97.7 F (36.5 C)  TempSrc: Oral   Oral  SpO2: 93% 94% 97% 94%  Weight:      Height:        General - Elderly thin built Caucasian male, in respiratory distress HEENT - PERRLA, EOMI, atraumatic head, non tender sinuses. Lung - decreased breath sounds, diffuse rales, rhonchi, no wheezes. Heart - S1, S2 heard, no murmurs, rubs, no pedal edema. Abdomen - Soft, non tender, bowel sounds good Neuro - Alert, awake and oriented x 3, non focal exam. Skin - Warm and dry.  Data Reviewed:      Latest Ref Rng & Units 02/23/2024    4:17 AM 02/22/2024    2:08 AM 02/21/2024    3:25 PM  CBC  WBC 4.0 - 10.5 K/uL 10.8  13.6  17.1   Hemoglobin 13.0 - 17.0 g/dL 85.6  84.9  83.6   Hematocrit 39.0 - 52.0 % 42.5  44.5  49.6   Platelets 150 - 400 K/uL 275  322  342       Latest Ref Rng & Units 02/23/2024    4:17 AM 02/21/2024    3:25 PM 08/12/2023    3:40 AM  BMP  Glucose 70 - 99 mg/dL 822  775  835   BUN 8 - 23 mg/dL 15  23  23    Creatinine 0.61 - 1.24 mg/dL 9.21  8.87  9.20   Sodium 135 - 145 mmol/L 134  138  138   Potassium 3.5 - 5.1 mmol/L 3.7  4.6  4.0   Chloride 98 - 111 mmol/L 108  105  105   CO2 22 - 32 mmol/L 18  22  24    Calcium  8.9 - 10.3 mg/dL 8.4  9.5  9.0    DG Swallowing Func-Speech Pathology Result Date: 02/23/2024 Table formatting from the original result was not included. Modified Barium Swallow Study Patient Details Name: Bruce Franklin MRN: [id number] Date of  Birth: 1945/06/27 Today's Date: 02/23/2024 HPI/PMH: HPI: [name] is a 78 y.o. male admitted 02/21/24 with sepsis, multifocal pna, suspected aspiration (vomiting earlier in the week), acute respiratory failure. PMHx COPD not on home oxygen, CAD, HFpEF, A-fib on Eliquis , hypertension, hyperlipidemia, insulin -dependent type 2 diabetes, prostate cancer, anxiety, tobacco abuse, chronic pain syndrome, oropharyngeal dysphagia -PEG tube 08/10/23; removed 7/31.  MBS 08/05/23 and 08/08/23 with significant dysphagia, DIGEST score of 3 severe dysfunction. Aspiration occurred due to mistiming of the swallow, altered mentation. Clinical Impression: Pt presents with a primary pharyngeal dysphagia that is similar to findings from last MBS in April of 2025.  He intermittently aspirated thin and nectar thick liquids due to delays in onset of the swallow response.    Aspiration was not consistently accompanied by a cough. When pt coughed, it was not protective  in nature.  Pt had difficulty following instructions to limit bolus size and to tuck chin (chin tuck, when implemented correctly, did not fully protect the airway). There was vallecular residue that collected with solid consistencies - spontaneous sub-swallows helped to clear.  Pt has made clear to staff that he does not want another PEG.  I spoke with his son at the bedside after the study, showed him images of the aspiration, and explained that aspiration will occur intermittently despite best efforts to prevent it.  He has tolerated aspiration in varying degrees over the course of the last six months, but now is more susceptible to getting sick as a result (admitted with pna). D/W Dr. Darci; Mr. Treese may benefit from Palliative Care consult.  SLP will follow. Continue dysphagia 3/thin liquids with small sips; pt will cough when eating/drinking. Factors that may increase risk of adverse event in presence of aspiration Noe & Lianne 2021): Factors that may increase  risk of adverse event in presence of aspiration Noe & Lianne 2021): Reduced cognitive function; Frail or deconditioned; Weak cough; Aspiration of thick, dense, and/or acidic materials; Frequent aspiration of large volumes Recommendations/Plan: Swallowing Evaluation Recommendations Swallowing Evaluation Recommendations Recommendations: PO diet PO Diet Recommendation: Dysphagia 3 (Mechanical soft); Thin liquids (Level 0) Liquid Administration via: Cup; Straw Medication Administration: Whole meds with puree Supervision: Staff to assist with self-feeding Swallowing strategies  : Small bites/sips; Slow rate Postural changes: Position pt fully upright for meals Oral care recommendations: Oral care BID (2x/day) Recommended consults: Consider Palliative care Treatment Plan Treatment Plan Treatment recommendations: Therapy as outlined in treatment plan below Follow-up recommendations: No SLP follow up Functional status assessment: Patient has had a recent decline in their functional status and demonstrates the ability to make significant improvements in function in a reasonable and predictable amount of time. Treatment frequency: Min 2x/week Treatment duration: 1 week Interventions: Patient/family education; Aspiration precaution training Recommendations Recommendations for follow up therapy are one component of a multi-disciplinary discharge planning process, led by the attending physician.  Recommendations may be updated based on patient status, additional functional criteria and insurance authorization. Assessment: Orofacial Exam: Orofacial Exam Oral Cavity: Oral Hygiene: WFL Oral Cavity - Dentition: Edentulous Orofacial Anatomy: WFL Anatomy: Anatomy: WFL Boluses Administered: Boluses Administered Boluses Administered: Thin liquids (Level 0); Mildly thick liquids (Level 2, nectar thick); Moderately thick liquids (Level 3, honey thick); Puree; Solid  Oral Impairment Domain: Oral Impairment Domain Lip Closure: No  labial escape Tongue control during bolus hold: Posterior escape of less than half of bolus Bolus preparation/mastication: Slow prolonged chewing/mashing with complete recollection Bolus transport/lingual motion: Brisk tongue motion Oral residue: Trace residue lining oral structures Location of oral residue : Tongue Initiation of pharyngeal swallow : Pyriform sinuses  Pharyngeal Impairment Domain: Pharyngeal Impairment Domain Soft palate elevation: No bolus between soft palate (SP)/pharyngeal wall (PW) Laryngeal elevation: Complete superior movement of thyroid cartilage with complete approximation of arytenoids to epiglottic petiole Anterior hyoid excursion: Complete anterior movement Epiglottic movement: Complete inversion Laryngeal vestibule closure: Incomplete, narrow column air/contrast in laryngeal vestibule Pharyngeal stripping wave : Present - complete Pharyngeal contraction (A/P view only): N/A Pharyngoesophageal segment opening: Complete distension and complete duration, no obstruction of flow Tongue base retraction: Narrow column of contrast or air between tongue base and PPW Pharyngeal residue: Collection of residue within or on pharyngeal structures Location of pharyngeal residue: Valleculae  Esophageal Impairment Domain: Esophageal Impairment Domain Esophageal clearance upright position: Complete clearance, esophageal coating Pill: Pill Consistency administered: -- (NT) Penetration/Aspiration Scale  Score: Penetration/Aspiration Scale Score 1.  Material does not enter airway: Moderately thick liquids (Level 3, honey thick); Puree; Solid 8.  Material enters airway, passes BELOW cords without attempt by patient to eject out (silent aspiration) : Thin liquids (Level 0); Mildly thick liquids (Level 2, nectar thick) Compensatory Strategies: Compensatory Strategies Compensatory strategies: Yes Straw: Ineffective Ineffective Straw: Thin liquid (Level 0); Mildly thick liquid (Level 2, nectar thick) Chin tuck:  Ineffective   General Information: Caregiver present: No  Diet Prior to this Study: Regular; Thin liquids (Level 0)   No data recorded  No data recorded  No data recorded  History of Recent Intubation: No  Behavior/Cognition: Alert; Cooperative Self-Feeding Abilities: Able to self-feed Baseline vocal quality/speech: Hypophonia/low volume Volitional Cough: -- (weak) Volitional Swallow: Able to elicit Exam Limitations: No limitations Goal Planning: Prognosis for improved oropharyngeal function: Guarded Barriers to Reach Goals: Cognitive deficits; Severity of deficits No data recorded No data recorded Consulted and agree with results and recommendations: Patient; Family member/caregiver Pain: Pain Assessment Pain Assessment: Faces Faces Pain Scale: 2 Breathing: 1 Negative Vocalization: 1 Facial Expression: 1 Body Language: 1 Consolability: 1 PAINAD Score: 5 End of Session: Start Time:SLP Start Time (ACUTE ONLY): 0945 Stop Time: SLP Stop Time (ACUTE ONLY): 1002 Time Calculation:SLP Time Calculation (min) (ACUTE ONLY): 17 min Charges: SLP Evaluations $ SLP Speech Visit: 1 Visit SLP Evaluations $BSS Swallow: 1 Procedure $MBS Swallow: 1 Procedure SLP visit diagnosis: SLP Visit Diagnosis: Dysphagia, unspecified (R13.10) Past Medical History: Past Medical History: Diagnosis Date  Acute biliary pancreatitis   Anxiety   Cholelithiasis   COPD (chronic obstructive pulmonary disease) (HCC)   Diabetes mellitus without complication (HCC)   Pneumonia   Prostate cancer (HCC)   Smokes 1 pack of cigarettes per day  Past Surgical History: Past Surgical History: Procedure Laterality Date  CHOLECYSTECTOMY N/A 04/01/2016  Procedure: LAPAROSCOPIC CHOLECYSTECTOMY;  Surgeon: Mitzie DELENA Freund, MD;  Location: [facility] OR;  Service: General;  Laterality: N/A;  ERCP N/A 03/30/2016  Procedure: ENDOSCOPIC RETROGRADE CHOLANGIOPANCREATOGRAPHY (ERCP);  Surgeon: Toribio SHAUNNA Cedar, MD;  Location: [facility]COPY;  Service: Endoscopy;  Laterality: N/A;  IR  GASTROSTOMY TUBE MOD SED  08/10/2023  LUMBAR FUSION    PROSTATE SURGERY    ROTATOR CUFF REPAIR   [name] 02/23/2024, 11:20 AM  CT ABDOMEN PELVIS W CONTRAST Result Date: 02/21/2024 CLINICAL DATA:  Left upper quadrant abdominal pain. EXAM: CT ABDOMEN AND PELVIS WITH CONTRAST TECHNIQUE: Multidetector CT imaging of the abdomen and pelvis was performed using the standard protocol following bolus administration of intravenous contrast. RADIATION DOSE REDUCTION: This exam was performed according to the departmental dose-optimization program which includes automated exposure control, adjustment of the mA and/or kV according to patient size and/or use of iterative reconstruction technique. CONTRAST:  75mL OMNIPAQUE  IOHEXOL  350 MG/ML SOLN COMPARISON:  CT abdomen pelvis dated 08/08/2023. FINDINGS: Evaluation of this exam is limited due to respiratory motion. Lower chest: Scattered bibasilar tiny ground-glass densities, left greater right suspicious for pneumonia or aspiration. No intra-abdominal free air or free fluid. Hepatobiliary: The liver is unremarkable. There is biliary dilatation, post cholecystectomy. There is mild pneumobilia. Pancreas: Unremarkable. No pancreatic ductal dilatation or surrounding inflammatory changes. Spleen: Normal in size without focal abnormality. Adrenals/Urinary Tract: The adrenal glands unremarkable. There is no hydronephrosis on either side. The visualized ureters and urinary bladder probable Stomach/Bowel: There is moderate stool throughout the colon. There is no bowel obstruction or active inflammation. The appendix is not visualized with certainty. No inflammatory changes identified  in the right lower quadrant. Vascular/Lymphatic: Moderate aortoiliac atherosclerotic disease. The IVC is unremarkable. No portal venous gas. There is no adenopathy. Reproductive: Prostatectomy. Other: None Musculoskeletal: Osteopenia with degenerative changes. Lower lumbar fusion. No acute  osseous pathology. IMPRESSION: 1. No acute intra-abdominal or pelvic pathology. 2. Moderate colonic stool burden. No bowel obstruction. 3. Scattered bibasilar tiny ground-glass densities, left greater right suspicious for pneumonia or aspiration. 4.  Aortic Atherosclerosis (ICD10-I70.0). Electronically Signed   By: Vanetta Chou M.D.   On: 02/21/2024 17:07   DG Chest 1 View Result Date: 02/21/2024 EXAM: 1 VIEW(S) XRAY OF THE CHEST 02/21/2024 03:48:00 PM COMPARISON: Sleep comparison 08/02/2023. CLINICAL HISTORY: SOB (shortness of breath); Cough. FINDINGS: LUNGS AND PLEURA: Hyperexpanded lungs with findings of emphysema. Patchy perihilar opacities in the left upper lung zone. Nodular opacities also noted in the right lung base, measuring up to 3.2 cm, worrisome for multifocal pneumonia. No pleural effusion. No pneumothorax. HEART AND MEDIASTINUM: No acute abnormality of the cardiac and mediastinal silhouettes. BONES AND SOFT TISSUES: Multilevel thoracic osteophytosis. No acute osseous abnormality. IMPRESSION: 1. Emphysema. Patchy perihilar airspace opacities in the left upper and right lower lung zones, suspicious for multifocal pneumonia. 2. One opacity in the right lung base measures 3.2 cm and appears more nodular. Follow-up chest radiograph in 6 to 12 weeks is recommended to document resolution and exclude developing neoplasm. Electronically signed by: Rogelia Myers MD 02/21/2024 03:56 PM EST RP Workstation: [id number]    Family Communication: Discussed with patient, wife, son at bedside. They understand and agree. All questions answered.  Disposition: Status is: Inpatient Remains inpatient appropriate because: IV antibiotics, SLP study, afib RVR  Planned Discharge Destination: Home with Home Health     Time spent: 51 minutes  Author: Concepcion Riser, MD 02/23/2024 1:56 PM Secure chat 7am to 7pm For on call review [email or web address].

## 2024-02-24 DIAGNOSIS — Z789 Other specified health status: Secondary | ICD-10-CM

## 2024-02-24 DIAGNOSIS — J188 Other pneumonia, unspecified organism: Secondary | ICD-10-CM | POA: Diagnosis not present

## 2024-02-24 DIAGNOSIS — I2510 Atherosclerotic heart disease of native coronary artery without angina pectoris: Secondary | ICD-10-CM | POA: Diagnosis not present

## 2024-02-24 DIAGNOSIS — A419 Sepsis, unspecified organism: Secondary | ICD-10-CM | POA: Diagnosis not present

## 2024-02-24 DIAGNOSIS — R1310 Dysphagia, unspecified: Secondary | ICD-10-CM

## 2024-02-24 DIAGNOSIS — R6520 Severe sepsis without septic shock: Secondary | ICD-10-CM

## 2024-02-24 DIAGNOSIS — K5901 Slow transit constipation: Secondary | ICD-10-CM | POA: Diagnosis not present

## 2024-02-24 DIAGNOSIS — Z66 Do not resuscitate: Secondary | ICD-10-CM

## 2024-02-24 LAB — CBC
HCT: 44.5 % (ref 39.0–52.0)
Hemoglobin: 15.1 g/dL (ref 13.0–17.0)
MCH: 30.2 pg (ref 26.0–34.0)
MCHC: 33.9 g/dL (ref 30.0–36.0)
MCV: 89 fL (ref 80.0–100.0)
Platelets: 283 K/uL (ref 150–400)
RBC: 5 MIL/uL (ref 4.22–5.81)
RDW: 13.2 % (ref 11.5–15.5)
WBC: 10.4 K/uL (ref 4.0–10.5)
nRBC: 0 % (ref 0.0–0.2)

## 2024-02-24 LAB — BASIC METABOLIC PANEL WITH GFR
Anion gap: 9 (ref 5–15)
BUN: 8 mg/dL (ref 8–23)
CO2: 20 mmol/L — ABNORMAL LOW (ref 22–32)
Calcium: 8.4 mg/dL — ABNORMAL LOW (ref 8.9–10.3)
Chloride: 103 mmol/L (ref 98–111)
Creatinine, Ser: 0.84 mg/dL (ref 0.61–1.24)
GFR, Estimated: >60 mL/min (ref >60)
Glucose, Bld: 134 mg/dL — ABNORMAL HIGH (ref 70–99)
Potassium: 3.8 mmol/L (ref 3.5–5.1)
Sodium: 132 mmol/L — ABNORMAL LOW (ref 135–145)

## 2024-02-24 LAB — GLUCOSE, CAPILLARY
Glucose-Capillary: 123 mg/dL — ABNORMAL HIGH (ref 70–99)
Glucose-Capillary: 124 mg/dL — ABNORMAL HIGH (ref 70–99)
Glucose-Capillary: 122 mg/dL — ABNORMAL HIGH (ref 70–99)
Glucose-Capillary: 106 mg/dL — ABNORMAL HIGH (ref 70–99)

## 2024-02-24 MED ORDER — PROSOURCE PLUS PO LIQD
30.0000 mL | Freq: Two times a day (BID) | ORAL | Status: DC
  Administered 2024-02-25 – 2024-02-27 (×2): 30 mL via ORAL
  Filled 2024-02-24 (×2): qty 30

## 2024-02-24 MED ORDER — APIXABAN 5 MG PO TABS
5.0000 mg | ORAL_TABLET | Freq: Two times a day (BID) | ORAL | Status: DC
  Administered 2024-02-25 – 2024-02-28 (×7): 5 mg via ORAL
  Filled 2024-02-24 (×7): qty 1

## 2024-02-24 MED ORDER — OXYCODONE HCL 5 MG PO TABS
10.0000 mg | ORAL_TABLET | Freq: Four times a day (QID) | ORAL | Status: DC | PRN
  Administered 2024-02-25 (×3): 10 mg via ORAL
  Filled 2024-02-24 (×3): qty 2

## 2024-02-24 NOTE — Progress Notes (Addendum)
 Progress Note   Patient: Bruce Franklin DOB: November 15, 1945 DOA: 02/21/2024     2 DOS: the patient was seen and examined on 02/24/2024   Brief hospital course: Bruce Franklin is a 78 y.o. male with medical history significant of COPD not on home oxygen, CAD, HFpEF, A-fib on Eliquis , hypertension, hyperlipidemia, insulin -dependent type 2 diabetes, prostate cancer, anxiety, tobacco abuse, chronic pain syndrome, oropharyngeal dysphagia underwent PEG tube placement in April 2025 and later improved and PEG tube was removed on 7/31.  Patient presents to the ED today with complaints of abdominal pain and shortness of breath.  Symptoms started about 3 days ago.  He is endorsing shortness of breath and productive cough.    He was hypoxic requiring 2 L [place], tachycardic. Labs with WBC count 17.1, COVID/influenza/RSV PCR negative. Chest x-ray showing patchy perihilar airspace opacities in the left upper and right lower lung zones suspicious for multifocal pneumonia. One opacity in the right lung base measures 3.2 cm and appears more lobular. Radiologist recommending follow-up chest radiograph in 6 to 12 weeks to assess resolution and exclude developing neoplasm.  CT abdomen pelvis negative for acute findings. Showing moderate colonic stool burden but no signs of bowel obstruction. Showing scattered bibasilar tiny ground glass densities, left greater than right, suspicious for pneumonia or aspiration. EKG showing sinus tachycardia, PACs, and no acute ischemic changes. Patient was given ceftriaxone , doxycycline , oxycodone , and MiraLAX  admitted to [facility] service for further management.  Patient had MBS 02/23/24, intermittent aspiration seen. Allowed dysphagia diet with small sips. Prognosis poor, palliative on board.  Assessment and Plan: Severe Sepsis Multifocal pneumonia Acute hypoxic respiratory failure High risk for aspiration- Patient presented with tachycardia, tachypnea, hypoxia, high WBC. Imaging  with multifocal pneumonia, possibly aspiration related due to dysphagia, had PEG removed few months ago. Continue supplemental oxygen to maintain saturation greater than 92%. Low grade fever  noted. Continue Unasyn therapy for aspiration pneumonia. S/p MBS showed intermittent aspiration. Patient and wife does not want PEG tube. Palliative team to discuss goals of care. Changed diet to dysphagia 3 with thick liquids. Continue to monitor vitals closely.  A-fib with RVR- Patient does have history of paroxysmal A-fib not on any rate control medications. RVR in the setting of aspiration episodes.  HR better on Cardizem  30 mg Q6 hourly with holding parameters. Continue Eliquis  therapy.    Abdominal pain secondary to constipation- Patient will be continued on scheduled MiraLAX , senna, Colace. Advised to avoid opiate medications.  Chronic HFpEF- Last echo April 2025 EF 50 to 55%, No symptoms of volume overload.  Uncontrolled type 2 diabetes mellitus- Last A1c 9.15 July 2023. Continue Accu-Cheks, sliding scale insulin  Q4 hourly as patient is NPO. He will need insulin  regimen upon discharge. Stop oral hypoglycemics.  CAD-no chest pain.  Continue aspirin, statin, beta-blocker.  Chronic pain syndrome- Patient is started on home oxycodone  therapy.  Severe malnutrition- Cachexia, BMI 18.49. Advised to stop Ozempic therapy. Discussed with wife regarding poor prognosis. He is willing to take risk of aspiration, advised DNR, comfort measures at home. Palliative team to meet family today for GOC discussion.     Out of bed to chair. Incentive spirometry. Nursing supportive care. Fall, aspiration precautions. Diet:  Diet Orders (From admission, onward)     Start     Ordered   02/24/24 1002  DIET DYS 3 Room service appropriate? Yes; Fluid consistency: Nectar Thick  Diet effective now       Question Answer Comment  Room service appropriate?  Yes   Fluid consistency: Nectar Thick       02/24/24 1002           DVT prophylaxis:  apixaban  (ELIQUIS ) tablet 5 mg  Level of care: Progressive   Code Status: Limited: Do not attempt resuscitation (DNR) -DNR-LIMITED -Do Not Intubate/DNI   Subjective: Patient is seen and examined today morning. No family at bedside. He is weak, lethargic. Asked for food. RN noted better swallowing with thickened liquids.  Physical Exam: Vitals:   02/24/24 0325 02/24/24 0539 02/24/24 0757 02/24/24 1217  BP: 134/62 (!) 141/65 124/69 116/72  Pulse: 97  (!) 106 95  Resp: 16  20 16   Temp:   (!) 100.4 F (38 C) 98.1 F (36.7 C)  TempSrc:   Oral Oral  SpO2: 94%  91% 95%  Weight:      Height:        General - Elderly thin built ill Caucasian male, in mild respiratory distress HEENT - PERRLA, EOMI, atraumatic head, non tender sinuses. Lung - decreased breath sounds, diffuse rales, rhonchi, no wheezes. Heart - S1, S2 heard, no murmurs, rubs, no pedal edema. Abdomen - Soft, non tender, bowel sounds good Neuro - Alert, awake and oriented, non focal exam. Skin - Warm and dry.  Data Reviewed:      Latest Ref Rng & Units 02/24/2024    3:32 AM 02/23/2024    4:17 AM 02/22/2024    2:08 AM  CBC  WBC 4.0 - 10.5 K/uL 10.4  10.8  13.6   Hemoglobin 13.0 - 17.0 g/dL 84.8  85.6  84.9   Hematocrit 39.0 - 52.0 % 44.5  42.5  44.5   Platelets 150 - 400 K/uL 283  275  322       Latest Ref Rng & Units 02/24/2024    3:32 AM 02/23/2024    4:17 AM 02/21/2024    3:25 PM  BMP  Glucose 70 - 99 mg/dL 865  822  775   BUN 8 - 23 mg/dL 8  15  23    Creatinine 0.61 - 1.24 mg/dL 9.15  9.21  8.87   Sodium 135 - 145 mmol/L 132  134  138   Potassium 3.5 - 5.1 mmol/L 3.8  3.7  4.6   Chloride 98 - 111 mmol/L 103  108  105   CO2 22 - 32 mmol/L 20  18  22    Calcium  8.9 - 10.3 mg/dL 8.4  8.4  9.5    DG Swallowing Func-Speech Pathology Result Date: 02/23/2024 Table formatting from the original result was not included. Modified Barium Swallow Study Patient  Details Name: Bruce Franklin MRN: [id number] Date of Birth: 05-08-1945 Today's Date: 02/23/2024 HPI/PMH: HPI: Bruce Franklin is a 78 y.o. male admitted 02/21/24 with sepsis, multifocal pna, suspected aspiration (vomiting earlier in the week), acute respiratory failure. PMHx COPD not on home oxygen, CAD, HFpEF, A-fib on Eliquis , hypertension, hyperlipidemia, insulin -dependent type 2 diabetes, prostate cancer, anxiety, tobacco abuse, chronic pain syndrome, oropharyngeal dysphagia -PEG tube 08/10/23; removed 7/31.  MBS 08/05/23 and 08/08/23 with significant dysphagia, DIGEST score of 3 severe dysfunction. Aspiration occurred due to mistiming of the swallow, altered mentation. Clinical Impression: Pt presents with a primary pharyngeal dysphagia that is similar to findings from last MBS in April of 2025.  He intermittently aspirated thin and nectar thick liquids due to delays in onset of the swallow response.    Aspiration was not consistently accompanied by a cough. When pt coughed, it was  not protective in nature.  Pt had difficulty following instructions to limit bolus size and to tuck chin (chin tuck, when implemented correctly, did not fully protect the airway). There was vallecular residue that collected with solid consistencies - spontaneous sub-swallows helped to clear.  Pt has made clear to staff that he does not want another PEG.  I spoke with his son at the bedside after the study, showed him images of the aspiration, and explained that aspiration will occur intermittently despite best efforts to prevent it.  He has tolerated aspiration in varying degrees over the course of the last six months, but now is more susceptible to getting sick as a result (admitted with pna). D/W Dr. Darci; Mr. Kretchmer may benefit from Palliative Care consult.  SLP will follow. Continue dysphagia 3/thin liquids with small sips; pt will cough when eating/drinking. Factors that may increase risk of adverse event in presence of aspiration  Noe & Lianne 2021): Factors that may increase risk of adverse event in presence of aspiration Noe & Lianne 2021): Reduced cognitive function; Frail or deconditioned; Weak cough; Aspiration of thick, dense, and/or acidic materials; Frequent aspiration of large volumes Recommendations/Plan: Swallowing Evaluation Recommendations Swallowing Evaluation Recommendations Recommendations: PO diet PO Diet Recommendation: Dysphagia 3 (Mechanical soft); Thin liquids (Level 0) Liquid Administration via: Cup; Straw Medication Administration: Whole meds with puree Supervision: Staff to assist with self-feeding Swallowing strategies  : Small bites/sips; Slow rate Postural changes: Position pt fully upright for meals Oral care recommendations: Oral care BID (2x/day) Recommended consults: Consider Palliative care Treatment Plan Treatment Plan Treatment recommendations: Therapy as outlined in treatment plan below Follow-up recommendations: No SLP follow up Functional status assessment: Patient has had a recent decline in their functional status and demonstrates the ability to make significant improvements in function in a reasonable and predictable amount of time. Treatment frequency: Min 2x/week Treatment duration: 1 week Interventions: Patient/family education; Aspiration precaution training Recommendations Recommendations for follow up therapy are one component of a multi-disciplinary discharge planning process, led by the attending physician.  Recommendations may be updated based on patient status, additional functional criteria and insurance authorization. Assessment: Orofacial Exam: Orofacial Exam Oral Cavity: Oral Hygiene: WFL Oral Cavity - Dentition: Edentulous Orofacial Anatomy: WFL Anatomy: Anatomy: WFL Boluses Administered: Boluses Administered Boluses Administered: Thin liquids (Level 0); Mildly thick liquids (Level 2, nectar thick); Moderately thick liquids (Level 3, honey thick); Puree; Solid  Oral Impairment  Domain: Oral Impairment Domain Lip Closure: No labial escape Tongue control during bolus hold: Posterior escape of less than half of bolus Bolus preparation/mastication: Slow prolonged chewing/mashing with complete recollection Bolus transport/lingual motion: Brisk tongue motion Oral residue: Trace residue lining oral structures Location of oral residue : Tongue Initiation of pharyngeal swallow : Pyriform sinuses  Pharyngeal Impairment Domain: Pharyngeal Impairment Domain Soft palate elevation: No bolus between soft palate (SP)/pharyngeal wall (PW) Laryngeal elevation: Complete superior movement of thyroid cartilage with complete approximation of arytenoids to epiglottic petiole Anterior hyoid excursion: Complete anterior movement Epiglottic movement: Complete inversion Laryngeal vestibule closure: Incomplete, narrow column air/contrast in laryngeal vestibule Pharyngeal stripping wave : Present - complete Pharyngeal contraction (A/P view only): N/A Pharyngoesophageal segment opening: Complete distension and complete duration, no obstruction of flow Tongue base retraction: Narrow column of contrast or air between tongue base and PPW Pharyngeal residue: Collection of residue within or on pharyngeal structures Location of pharyngeal residue: Valleculae  Esophageal Impairment Domain: Esophageal Impairment Domain Esophageal clearance upright position: Complete clearance, esophageal coating Pill: Pill Consistency administered: -- (NT)  Penetration/Aspiration Scale Score: Penetration/Aspiration Scale Score 1.  Material does not enter airway: Moderately thick liquids (Level 3, honey thick); Puree; Solid 8.  Material enters airway, passes BELOW cords without attempt by patient to eject out (silent aspiration) : Thin liquids (Level 0); Mildly thick liquids (Level 2, nectar thick) Compensatory Strategies: Compensatory Strategies Compensatory strategies: Yes Straw: Ineffective Ineffective Straw: Thin liquid (Level 0); Mildly  thick liquid (Level 2, nectar thick) Chin tuck: Ineffective   General Information: Caregiver present: No  Diet Prior to this Study: Regular; Thin liquids (Level 0)   No data recorded  No data recorded  No data recorded  History of Recent Intubation: No  Behavior/Cognition: Alert; Cooperative Self-Feeding Abilities: Able to self-feed Baseline vocal quality/speech: Hypophonia/low volume Volitional Cough: -- (weak) Volitional Swallow: Able to elicit Exam Limitations: No limitations Goal Planning: Prognosis for improved oropharyngeal function: Guarded Barriers to Reach Goals: Cognitive deficits; Severity of deficits No data recorded No data recorded Consulted and agree with results and recommendations: Patient; Family member/caregiver Pain: Pain Assessment Pain Assessment: Faces Faces Pain Scale: 2 Breathing: 1 Negative Vocalization: 1 Facial Expression: 1 Body Language: 1 Consolability: 1 PAINAD Score: 5 End of Session: Start Time:SLP Start Time (ACUTE ONLY): 0945 Stop Time: SLP Stop Time (ACUTE ONLY): 1002 Time Calculation:SLP Time Calculation (min) (ACUTE ONLY): 17 min Charges: SLP Evaluations $ SLP Speech Visit: 1 Visit SLP Evaluations $BSS Swallow: 1 Procedure $MBS Swallow: 1 Procedure SLP visit diagnosis: SLP Visit Diagnosis: Dysphagia, unspecified (R13.10) Past Medical History: Past Medical History: Diagnosis Date  Acute biliary pancreatitis   Anxiety   Cholelithiasis   COPD (chronic obstructive pulmonary disease) (HCC)   Diabetes mellitus without complication (HCC)   Pneumonia   Prostate cancer (HCC)   Smokes 1 pack of cigarettes per day  Past Surgical History: Past Surgical History: Procedure Laterality Date  CHOLECYSTECTOMY N/A 04/01/2016  Procedure: LAPAROSCOPIC CHOLECYSTECTOMY;  Surgeon: Mitzie DELENA Freund, MD;  Location: [facility] OR;  Service: General;  Laterality: N/A;  ERCP N/A 03/30/2016  Procedure: ENDOSCOPIC RETROGRADE CHOLANGIOPANCREATOGRAPHY (ERCP);  Surgeon: Toribio SHAUNNA Cedar, MD;  Location: [facility] ENDOSCOPY;   Service: Endoscopy;  Laterality: N/A;  IR GASTROSTOMY TUBE MOD SED  08/10/2023  LUMBAR FUSION    PROSTATE SURGERY    ROTATOR CUFF REPAIR   [name] 02/23/2024, 11:20 AM   Family Communication: palliative discussion with wife, son today.   Disposition: Status is: Inpatient Remains inpatient appropriate because: sick, aspiration pna, afib, IV antibiotics, poor prognosis.  Planned Discharge Destination: Home with Home Health     Time spent: 52 minutes  Author: Concepcion Riser, MD 02/24/2024 2:44 PM Secure chat 7am to 7pm For on call review [email or web address].

## 2024-02-24 NOTE — Progress Notes (Signed)
 Initial Nutrition Assessment  DOCUMENTATION CODES:   Severe malnutrition in context of chronic illness, Underweight  INTERVENTION:  Magic cup TID with meals, each supplement provides 290 kcal and 9 grams of protein. 1 packet ProSource Plus BID. Each supplement provides 100 Kcals and 15 g protein. Continue Dysphagia 3 diet with nectar-thick liquids. Advancement per SLP.   NUTRITION DIAGNOSIS:   Severe Malnutrition related to chronic illness as evidenced by severe muscle depletion, severe fat depletion.   GOAL:   Patient will meet greater than or equal to 90% of their needs, Weight gain   MONITOR:   PO intake, Supplement acceptance, Diet advancement, Skin, Labs, Weight trends  REASON FOR ASSESSMENT:    (pressure injury)    ASSESSMENT:   Patient presented with shortness of breath and abdominal pain and was found to have PNA suspicious for aspiration, sepsis and constipation. PMH significant for DM2 uncontrolled, dysphagia s/p PEG 07/2023 removed 11/10/23, COPD, CAD, HFpEF, Afib, HTN, dyslipidemia, prostate cancer, anxiety, tobacco use, chronic pain syndrome, cholelithiasis s/p cholecystectomy 03/2016, and acute biliary pancreatitis s/p ERCP 03/2016.  The patient is s/p MBS 11/13 with SLP advising the patient and family that he will continue to intermittently aspirate due to reduced cognitive function, frailty, deconditioning and weak cough. The patient has made it clear he does not want another PEG. Dysphagia 3 diet recommended with nectar-thick liquids. Palliative continues to follow with ongoing GOC discussions. Visited the patient who was fast asleep. His wife [name] is at bedside. She states he has not eaten at all today and has been sleeping. She tells me he intermittently eats well at times. About a month ago, his doctor provided him with a white liquid medication that stimulates his appetite which she gives to him up to TID which she strongly believes helped and he started eating  very well up until a few days ago. We discussed Magic Cup and ProSource Plus. The patient woke up at the end of my visit and I wished him an early happy birthday. He did not say much.  Typical day's intake: Breakfast - waffles with sausage Lunch - chicken salad sandwich or vegetable beef soup Dinner - chicken with broccoli  Scheduled Meds:  apixaban   5 mg Per Tube BID   Chlorhexidine  Gluconate Cloth  6 each Topical Daily   diltiazem   30 mg Oral Q6H   guaiFENesin   600 mg Oral BID   insulin  aspart  0-5 Units Subcutaneous QHS   insulin  aspart  0-9 Units Subcutaneous TID WC   mupirocin ointment   Nasal BID   polyethylene glycol  17 g Oral Daily   senna-docusate  1 tablet Oral BID   Continuous Infusions:  ampicillin-sulbactam (UNASYN) IV 3 g (02/24/24 1239)   PRN Meds:.acetaminophen  **OR** acetaminophen , ALPRAZolam , levalbuterol, oxyCODONE , prochlorperazine  Diet Order             DIET DYS 3 Room service appropriate? Yes; Fluid consistency: Nectar Thick  Diet effective now                  Meal Intake: Minimal per wife, 0% today  Labs:     Latest Ref Rng & Units 02/24/2024    3:32 AM 02/23/2024    4:17 AM 02/21/2024    3:25 PM  CMP  Glucose 70 - 99 mg/dL 865  822  775   BUN 8 - 23 mg/dL 8  15  23    Creatinine 0.61 - 1.24 mg/dL 9.15  9.21  8.87   Sodium  135 - 145 mmol/L 132  134  138   Potassium 3.5 - 5.1 mmol/L 3.8  3.7  4.6   Chloride 98 - 111 mmol/L 103  108  105   CO2 22 - 32 mmol/L 20  18  22    Calcium  8.9 - 10.3 mg/dL 8.4  8.4  9.5   Total Protein 6.5 - 8.1 g/dL   7.9   Total Bilirubin 0.0 - 1.2 mg/dL   0.9   Alkaline Phos 38 - 126 U/L   50   AST 15 - 41 U/L   14   ALT 0 - 44 U/L   17      I/O: -1 L since admit  NUTRITION - FOCUSED PHYSICAL EXAM:  Flowsheet Row Most Recent Value  Orbital Region Severe depletion  Upper Arm Region Moderate depletion  Thoracic and Lumbar Region Mild depletion  Buccal Region Severe depletion  Temple Region Severe  depletion  Clavicle Bone Region Severe depletion  Clavicle and Acromion Bone Region Severe depletion  Scapular Bone Region Severe depletion  Dorsal Hand Severe depletion  Patellar Region Severe depletion  Anterior Thigh Region Severe depletion  Posterior Calf Region Severe depletion  Edema (RD Assessment) None  Hair Reviewed  Eyes Reviewed  Mouth --  [edentulous]  Skin Reviewed  Nails Reviewed    EDUCATION NEEDS:   Education needs have been addressed  Skin:  Skin Assessment: Skin Integrity Issues: Skin Integrity Issues:: Other (Comment) Other: Hip and coccyx wounds charted. No WOC eval.  Last BM:  11/14 type 5  Height:   Ht Readings from Last 1 Encounters:  02/21/24 6' 3 (1.905 m)    Weight:    Weight Change: No weight from this admission, 67.1 kg charted by nursing using July weight.  Usual Body Weight: 126 lbs 2 weeks ago per wife, 160-170 lbs 1 year ago  Edema: none  Ideal Body Weight:  89 kg   BMI:  Body mass index is 18.49 kg/m.  Estimated Nutritional Needs:  Kcal:  2100-2400 Protein:  115-130 Fluid:  >2100    Leverne Ruth, MS, RDN, LDN [place] See AMION for contact information

## 2024-02-24 NOTE — Progress Notes (Signed)
 Daily Progress Note   Date: 02/24/2024   Patient Name: Bruce Franklin  DOB: June 13, 1945  MRN: [id number]  Age / Sex: 78 y.o., male  Attending Physician: Darci Pore, MD Primary Care Physician: Dalbert Standing, MD Admit Date: 02/21/2024 Length of Stay: 2 days  Reason for Follow-up: Establishing goals of care  Past Medical History:  Diagnosis Date   Acute biliary pancreatitis    Anxiety    Cholelithiasis    COPD (chronic obstructive pulmonary disease) (HCC)    Diabetes mellitus without complication (HCC)    Pneumonia    Prostate cancer (HCC)    Smokes 1 pack of cigarettes per day     Subjective:   Subjective: Chart Reviewed. Updates received. Patient Assessed. Created space and opportunity for patient  and family to explore thoughts and feelings regarding current medical situation.  Today's Discussion: Today before meeting with the patient/family, I reviewed the chart notes including nursing note from yesterday, internal medicine note from yesterday, nursing note from today, internal medicine note from today. I also reviewed vital signs, nursing flowsheets, medication administrations record, labs, and imaging. Labs reviewed include CBC which shows normal white blood cell count at 10.4 compared to slightly elevated at 10.8 which is overall improved from 17.1 three days ago in the setting of aspiration pneumonia.  Today saw the patient at the bedside, his wife [name] was present for scheduled 1:00 PM family meeting.  The patient was initially awake when I entered the room, he greeted me.  He states he is doing okay.  Shortly thereafter he fell asleep and remained asleep for the entirety my visit.  His wife shares that he has not had much of an appetite lately.  We spent an extensive amount of time talking about his chronic health and acute illness.  We talked about his previous experience with a PEG tube.  Family is clear that he would not want a PEG tube again.  We spent an  extensive amount of time talking about his dysphagia including showing images from the MBS performed by speech therapy.  We talked about him remaining a very high risk for aspiration and the likelihood that we he would continue to aspirate and would likely develop aspiration pneumonia again in the future.  We talked about PEG tubes and they are uses and limitations.  We were clear that PEG tubes do not prevent aspiration, statistically do not prolong life, and do not improve quality of life.  Patient's wife agrees and supports husband's decision to decline a PEG tube.  We spent time talk about options moving forward without a PEG tube.  We talked about CODE STATUS in the setting of serious illness.  We again emphasized that he is high risk for aspiration and would likely continue to have these episodes.  We spent time talking about whether the patient would want to come back and forth to the hospital.  We talked about options including continuing full scope of care including dietary limitations to try to reduce his aspiration risk.  We could also liberate his diet excepting the risk of repeated aspiration.  In this case we would recommend hospice care.  I described hospice as a service for patients who have a life expectancy of 6 months or less. The goal of hospice is the preservation of dignity and quality at the end phases of life. Under hospice care, the focus changes from curative to symptom relief. I explained the three setting where hospice services can be provided including  the home, at a living facility (such as LTC SNF, Assisted Living, etc), and a hospice facility. I explained that acceptance to hospice in any specific location is the final decision of the hospice medical director and bed availability, if applicable. They verbalized understanding.  After deliberating and discussing details about her various options the patient's wife has elected to transition to DNR/DNI, continue to treat the  treatable otherwise to attempt to discharge from the hospital.  They do not feel ready for hospice care yet.  Offered outpatient palliative care and they would like to hold off on this as well.  However, she did request the contact number for hospice of [facility].  She states that if she sees him declining she would reach out to them for further discussions.  I provided our contact information and shared that I would follow-up on Tuesday, 02/28/2024 when I am back on service if he remains admitted.  I shared that our team is available over the weekend for any significant change or new needs that prompt repeat goals of care.  I provided emotional and general support through therapeutic listening, empathy, sharing of stories, and other techniques. I answered all questions and addressed all concerns to the best of my ability.  Review of Systems  Constitutional:        Denies pain in general  Respiratory:  Negative for shortness of breath.   Gastrointestinal:  Negative for abdominal pain, nausea and vomiting.    Objective:   Primary Diagnoses: Present on Admission:  Multifocal pneumonia  Sepsis (HCC)  CAD (coronary artery disease)   Vital Signs:  BP 116/72 (BP Location: Left Arm)   Pulse 95   Temp 98.1 F (36.7 C) (Oral)   Resp 16   Ht 6' 3 (1.905 m)   Wt 67.1 kg Comment: from July 2025 records  SpO2 95%   BMI 18.49 kg/m   Physical Exam Vitals and nursing note reviewed.  Constitutional:      General: He is not in acute distress.    Appearance: He is ill-appearing. He is not toxic-appearing.     Comments: Initially alert but easily fell asleep for the majority of the visit; appears very frail and weak  HENT:     Head: Normocephalic and atraumatic.  Cardiovascular:     Rate and Rhythm: Normal rate.  Pulmonary:     Effort: Pulmonary effort is normal. No respiratory distress.     Comments: Noted intermittent cough Abdominal:     General: Abdomen is flat.      Palpations: Abdomen is soft.  Skin:    General: Skin is warm and dry.     Palliative Assessment/Data: 20-30%   Existing Vynca/ACP Documentation: None  Advanced Care Planning:   Existing Vynca/ACP Documentation: None  Primary Decision Maker: NEXT OF KIN  Pertinent diagnosis: Sepsis, aspiration pneumonia, COPD, HFpEF, A-fib on anticoagulation  The patient and/or family consented to a voluntary Advance Care Planning Conversation in person. Individuals present for the conversation: The patient (briefly); patient's wife [name]; Camellia Kays, NP  Summary of the conversation: We talked about CODE STATUS, details of his chronic illness and acute illness, treatment options given that the patient does not want a feeding tube, options including full and aggressive care through comfort/hospice care endpoints in between, availability of outpatient palliative care.  Outcome of the conversations and/or documents completed: DNR/DNI (DNR-limited), continue to treat treatable, goal for discharge from the hospital, family will contact hospice when they feel ready  I spent  20 minutes providing separately identifiable ACP services with the patient and/or surrogate decision maker in a voluntary, in-person conversation discussing the patient's wishes and goals as detailed in the above note.  Assessment & Plan:   HPI/Patient Profile:  78 y.o. male  with past medical history of COPD not on home oxygen, CAD, HFpEF, A-fib on Eliquis , hypertension, hyperlipidemia, insulin -dependent type 2 diabetes, prostate cancer, anxiety, tobacco abuse, chronic pain syndrome, oropharyngeal dysphagia underwent PEG tube placement in April 2025 and later improved and PEG tube was removed on 7/31. Patient presents to the ED today with complaints of abdominal pain and shortness of breath that started 3 days ago.  He was admitted on 02/21/2024 with sepsis, multifocal pneumonia, acute hypoxic respiratory failure, high risk for  aspiration, A-fib with RVR, abdominal pain secondary to constipation, chronic HFpEF, uncontrolled type 2 diabetes, severe malnutrition, and others.    Palliative medicine was consulted for GOC conversations.  SUMMARY OF RECOMMENDATIONS   Changed to DNR-Limited Continue full scope of care otherwise Continue to treat the treatable Discharge home when able Patient's family provided with contact information for hospice of [place] that they will reach out to when ready Palliative medicine will follow-up Tuesday, 02/28/2024 if still admitted Please notify us  of any significant clinical change or new palliative needs to justify further GOC in the interim  Symptom Management:  Per primary team Palliative medicine is available to assist as needed  Code Status: DNR - Limited (DNR/DNI)  Prognosis: < 6 months  Discharge Planning: To Be Determined  Discussed with: Patient's family, medical team, nursing team  Thank you for allowing us  to participate in the care of Bruce Franklin PMT will continue to support holistically.  Billing based on MDM: High  Problems Addressed: One acute or chronic illness or injury that poses a threat to life or bodily function  Amount and/or Complexity of Data: Category 1:Review of prior external note(s) from each unique source, Review of the result(s) of each unique test, and Assessment requiring an independent historian(s) and Category 3:Discussion of management or test interpretation with external physician/other qualified health care professional/appropriate source (not separately reported)  Risks: Decision not to resuscitate or to de-escalate care because of poor prognosis  Detailed review of medical records (labs, imaging, vital signs), medically appropriate exam, discussed with treatment team, counseling and education to patient, family, & staff, documenting clinical information, medication management, coordination of care  Camellia Kays, NP Palliative  Medicine Team  Team Phone # [phone] (Nights/Weekends)  12/09/2020, 8:17 AM

## 2024-02-24 NOTE — Plan of Care (Signed)
  Problem: Coping: Goal: Ability to adjust to condition or change in health will improve Outcome: Not Progressing   Problem: Fluid Volume: Goal: Ability to maintain a balanced intake and output will improve Outcome: Not Progressing   Problem: Health Behavior/Discharge Planning: Goal: Ability to identify and utilize available resources and services will improve Outcome: Not Progressing Goal: Ability to manage health-related needs will improve Outcome: Not Progressing

## 2024-02-24 NOTE — Progress Notes (Signed)
 Speech Language Pathology Treatment: Dysphagia  Patient Details Name: GEORGES VICTORIO MRN: [id number] DOB: 07-Feb-1946 Today's Date: 02/24/2024 Time: 8578-8567 SLP Time Calculation (min) (ACUTE ONLY): 11 min  Assessment / Plan / Recommendation Clinical Impression  Met with Mrs. Cammon in pt's room after she and her husband completed GOC meeting with Palliative Care this afternoon.  Mr. Kinsella was sleeping, so no POs were offered, but we reviewed results of MBS.  She verbalized understanding of intermittent aspiration of thin/nectar liquids; cough that is not protective; poor timing of the swallow; similar pattern of swallowing noted on 4/25 MBS studies.  She confirms no PEG is desired. See Camellia Skelton note from this afternoon.  Plan:  Continue dysphagia 3 diet; small bites; small sips of thin liquid. (Pt was just as likely to aspirate nectar liquids, so thickening to this consistency will offer no benefit and pt/family do not want thicker liquids).  Give meds whole in puree. Pt should be seated upright. He will cough intermittently.  D/W Camellia Kays.  SLP will follow.      HPI HPI: DVID PENDRY is a 78 y.o. male admitted 02/21/24 with sepsis, multifocal pna, suspected aspiration, acute respiratory failure.  MBS 11/13: intermittent aspiration thin and nectar thick liquids due to delays in onset of the swallow response.  Both silent and sensed aspiration. Performance c/w prior MBS studies.  Pt/wife met with Palliative Care 11/14; plan is to continue POs with known risk of recurring pna due to aspiration. PMHx COPD not on home oxygen, CAD, HFpEF, A-fib on Eliquis , hypertension, hyperlipidemia, insulin -dependent type 2 diabetes, prostate cancer, anxiety, tobacco abuse, chronic pain syndrome, oropharyngeal dysphagia -PEG tube 08/10/23; removed 7/31.  MBS 08/05/23 and 08/08/23 with significant dysphagia, DIGEST score of 3 severe dysfunction. Aspiration occurred due to mistiming of the swallow, altered  mentation.      SLP Plan  Continue with current plan of care          Recommendations  Diet recommendations: Dysphagia 3 (mechanical soft);Thin liquid Liquids provided via: Cup;Straw Medication Administration: Whole meds with puree Supervision: Staff to assist with self feeding Compensations: Minimize environmental distractions;Slow rate;Small sips/bites Postural Changes and/or Swallow Maneuvers: Seated upright 90 degrees                  Oral care BID     Dysphagia, pharyngeal phase (R13.13)     Continue with current plan of care    Vincy Feliz L. Vona, MA CCC/SLP Clinical Specialist - Acute Care SLP Acute Rehabilitation Services Office number [phone]  [name]  02/24/2024, 2:46 PM

## 2024-02-24 NOTE — Evaluation (Signed)
 Occupational Therapy Evaluation Patient Details Name: Bruce Franklin MRN: [id number] DOB: 12-04-1945 Today's Date: 02/24/2024   History of Present Illness   78 yo M adm 02/21/24 with abdominal pain, constipation, acute respiratory failure, PNA. PMH: HTN, HLD, CAD, HFpEF, COPD, DM, AFib on Eliquis , prostate CA, chronic pain     Clinical Impressions Pt presents with decline in function and safety with ADLs and ADL mobility with impaired strength, balance, endurance and cognition and communicating. Pt reports that he lives with his wife, was using a cane and was Ind with ADLs but later stated that he used a walker and that his wife assists him with ADLs. No family/caregiver present to provide/confirm PLOF info. Pt required total A +2 for rolling in bed for peri hygiene total A to sit EOB, min A with simple grooming and self feeding with finger food (banana) and began to cough, total A with all other ADL/selfcare tasks. Pt required mod A to balance at EOB with flexed posture at leaning posteriorly at time. Pt on 3-4L O2 with SATs dropping to 83% during seated ADL tasks. OT will follow acutely to maximize level of function and safety     If plan is discharge home, recommend the following:   Two people to help with bathing/dressing/bathroom;Two people to help with walking and/or transfers;Assistance with feeding;Direct supervision/assist for financial management;Supervision due to cognitive status;Direct supervision/assist for medications management;Help with stairs or ramp for entrance     Functional Status Assessment   Patient has had a recent decline in their functional status and demonstrates the ability to make significant improvements in function in a reasonable and predictable amount of time.     Equipment Recommendations   Other (comment) (defer)     Recommendations for Other Services         Precautions/Restrictions   Precautions Precautions: Fall;Other  (comment) Precaution/Restrictions Comments: 3-4L O2 Restrictions Weight Bearing Restrictions Per Provider Order: No     Mobility Bed Mobility Overal bed mobility: Needs Assistance Bed Mobility: Supine to Sit, Sit to Supine, Rolling Rolling: Total assist, +2 for physical assistance   Supine to sit: Total assist, +2 for physical assistance Sit to supine: Total assist, +2 for physical assistance   General bed mobility comments: difficulty following instructions    Transfers                   General transfer comment: unable to safely attempt      Balance Overall balance assessment: Needs assistance Sitting-balance support: Bilateral upper extremity supported, Feet supported Sitting balance-Leahy Scale: Poor   Postural control: Posterior lean, Left lateral lean                                 ADL either performed or assessed with clinical judgement   ADL                                         General ADL Comments: min A with washing hands/face at bed level, combing hair, total A with all other ADLs/selfcare     Vision Ability to See in Adequate Light: 0 Adequate Patient Visual Report: No change from baseline       Perception         Praxis         Pertinent Vitals/Pain Pain Assessment Pain Assessment:  Faces Faces Pain Scale: Hurts little more Pain Location: generalized with mobility Pain Descriptors / Indicators: Grimacing, Guarding, Moaning Pain Intervention(s): Limited activity within patient's tolerance, Monitored during session, Premedicated before session, Repositioned     Extremity/Trunk Assessment Upper Extremity Assessment Upper Extremity Assessment: Generalized weakness;Right hand dominant   Lower Extremity Assessment Lower Extremity Assessment: Defer to PT evaluation   Cervical / Trunk Assessment Cervical / Trunk Assessment: Kyphotic   Communication Communication Communication: Impaired Factors  Affecting Communication: Reduced clarity of speech;Difficulty expressing self   Cognition Arousal: Lethargic Behavior During Therapy: Flat affect Cognition: No family/caregiver present to determine baseline                               Following commands: Impaired Following commands impaired: Follows one step commands with increased time, Follows one step commands inconsistently     Cueing  General Comments   Cueing Techniques: Verbal cues;Tactile cues;Visual cues      Exercises     Shoulder Instructions      Home Living                                   Additional Comments: pt a poor historian an unable to gather PLOF info from him. Also difficulty understanding pt speech      Prior Functioning/Environment Prior Level of Function : Patient poor historian/Family not available             Mobility Comments: uncertain at this time, no caregiver/family present ADLs Comments: uncertain at this time, no caregiver/family present    OT Problem List: Decreased strength;Decreased knowledge of use of DME or AE;Decreased cognition;Decreased activity tolerance;Impaired balance (sitting and/or standing);Pain   OT Treatment/Interventions: Self-care/ADL training;Therapeutic exercise;Patient/family education;Balance training;Therapeutic activities;DME and/or AE instruction      OT Goals(Current goals can be found in the care plan section)   Acute Rehab OT Goals Patient Stated Goal: none stated OT Goal Formulation: With patient Time For Goal Achievement: 03/09/24 Potential to Achieve Goals: Fair ADL Goals Pt Will Perform Grooming: with min assist;with contact guard assist;sitting Pt Will Perform Upper Body Bathing: with max assist;sitting Pt Will Perform Upper Body Dressing: with max assist;sitting Pt Will Transfer to Toilet: with max assist;with mod assist;stand pivot transfer;bedside commode Additional ADL Goal #1: Pt will complete bed mobility  max-mod A to sit EOB in prep for grooming and UB ADL tasks   OT Frequency:  Min 2X/week    Co-evaluation              AM-PAC OT 6 Clicks Daily Activity     Outcome Measure Help from another person eating meals?: A Little Help from another person taking care of personal grooming?: A Little Help from another person toileting, which includes using toliet, bedpan, or urinal?: Total Help from another person bathing (including washing, rinsing, drying)?: Total Help from another person to put on and taking off regular upper body clothing?: Total Help from another person to put on and taking off regular lower body clothing?: Total 6 Click Score: 10   End of Session Nurse Communication: Mobility status  Activity Tolerance: Patient limited by fatigue Patient left: in bed;with call bell/phone within reach;with bed alarm set  OT Visit Diagnosis: Other abnormalities of gait and mobility (R26.89);Muscle weakness (generalized) (M62.81)                Time:  8863-8796 OT Time Calculation (min): 27 min Charges:  OT General Charges $OT Visit: 1 Visit OT Evaluation $OT Eval Moderate Complexity: 1 Mod OT Treatments $Therapeutic Activity: 8-22 mins   Jacques Karna Loose 02/24/2024, 2:31 PM

## 2024-02-25 DIAGNOSIS — E43 Unspecified severe protein-calorie malnutrition: Secondary | ICD-10-CM | POA: Insufficient documentation

## 2024-02-25 DIAGNOSIS — K5901 Slow transit constipation: Secondary | ICD-10-CM | POA: Diagnosis not present

## 2024-02-25 DIAGNOSIS — A419 Sepsis, unspecified organism: Secondary | ICD-10-CM | POA: Diagnosis not present

## 2024-02-25 DIAGNOSIS — J188 Other pneumonia, unspecified organism: Secondary | ICD-10-CM | POA: Diagnosis not present

## 2024-02-25 DIAGNOSIS — I2510 Atherosclerotic heart disease of native coronary artery without angina pectoris: Secondary | ICD-10-CM | POA: Diagnosis not present

## 2024-02-25 LAB — BASIC METABOLIC PANEL WITH GFR
Anion gap: 12 (ref 5–15)
BUN: 8 mg/dL (ref 8–23)
CO2: 20 mmol/L — ABNORMAL LOW (ref 22–32)
Calcium: 8.5 mg/dL — ABNORMAL LOW (ref 8.9–10.3)
Chloride: 104 mmol/L (ref 98–111)
Creatinine, Ser: 0.73 mg/dL (ref 0.61–1.24)
GFR, Estimated: >60 mL/min (ref >60)
Glucose, Bld: 143 mg/dL — ABNORMAL HIGH (ref 70–99)
Potassium: 3.3 mmol/L — ABNORMAL LOW (ref 3.5–5.1)
Sodium: 136 mmol/L (ref 135–145)

## 2024-02-25 LAB — CBC
HCT: 42.8 % (ref 39.0–52.0)
Hemoglobin: 14.6 g/dL (ref 13.0–17.0)
MCH: 29.9 pg (ref 26.0–34.0)
MCHC: 34.1 g/dL (ref 30.0–36.0)
MCV: 87.5 fL (ref 80.0–100.0)
Platelets: 262 K/uL (ref 150–400)
RBC: 4.89 MIL/uL (ref 4.22–5.81)
RDW: 13.1 % (ref 11.5–15.5)
WBC: 7.1 K/uL (ref 4.0–10.5)
nRBC: 0 % (ref 0.0–0.2)

## 2024-02-25 LAB — GLUCOSE, CAPILLARY
Glucose-Capillary: 155 mg/dL — ABNORMAL HIGH (ref 70–99)
Glucose-Capillary: 95 mg/dL (ref 70–99)
Glucose-Capillary: 235 mg/dL — ABNORMAL HIGH (ref 70–99)
Glucose-Capillary: 133 mg/dL — ABNORMAL HIGH (ref 70–99)

## 2024-02-25 MED ORDER — GUAIFENESIN 200 MG PO TABS
200.0000 mg | ORAL_TABLET | Freq: Two times a day (BID) | ORAL | Status: DC
  Administered 2024-02-25 – 2024-02-27 (×5): 200 mg via ORAL
  Filled 2024-02-25 (×8): qty 1

## 2024-02-25 MED ORDER — POTASSIUM CHLORIDE 20 MEQ PO PACK
40.0000 meq | PACK | Freq: Once | ORAL | Status: AC
  Administered 2024-02-25: 40 meq via ORAL
  Filled 2024-02-25: qty 2

## 2024-02-25 MED ORDER — MUPIROCIN 2 % EX OINT: TOPICAL_OINTMENT | Freq: Two times a day (BID) | CUTANEOUS | Status: AC

## 2024-02-25 NOTE — Progress Notes (Addendum)
 Late Entry Evaluation Note for 11/14.  Pt admitted with/for abdominal pain, constipation, acute respiratory failure, PNA.  Pt is quite weak and deconditioned needing maximal assist for basic activity around the bed and standing at bedside.  Pt currently limited functionally due to the problems listed. ( See problems list.)   Pt will benefit from PT to maximize function and safety in order to get ready for next venue listed below.    02/24/24 1736  PT Visit Information  Last PT Received On 02/24/24  Assistance Needed +2  History of Present Illness 78 yo M adm 02/21/24 with abdominal pain, constipation, acute respiratory failure, PNA. PMH: HTN, HLD, CAD, HFpEF, COPD, DM, AFib on Eliquis , prostate CA, chronic pain  Precautions  Precautions Fall;Other (comment)  Precaution/Restrictions Comments 3-4L O2  Restrictions  Weight Bearing Restrictions Per Provider Order No  Home Living  Family/patient expects to be discharged to: Private residence  Living Arrangements Spouse/significant other  Available Help at Discharge Family;Available 24 hours/day  Type of Home House  Home Layout Able to live on main level with bedroom/bathroom (sleeps in his recliner, uses a BSC next to the chair.)  Home Equipment BSC/3in1  Additional Comments pt is a poor historian, but related a few bits of info.  Still not much PLOF.  Pt says wife helps SO much.  Prior Function  Prior Level of Function  Patient poor historian/Family not available  Mobility Comments uncertain at this time, no caregiver/family present  ADLs Comments uncertain at this time, no caregiver/family present  Pain Assessment  Pain Assessment Faces  Pain Location generalized with mobility, neck all over sore  Pain Descriptors / Indicators Grimacing;Guarding;Moaning;Sore  Pain Intervention(s) Monitored during session;Limited activity within patient's tolerance  Cognition  Arousal Lethargic  Behavior During Therapy Flat affect  PT - Cognitive  impairments No family/caregiver present to determine baseline  Following Commands  Following commands Impaired  Following commands impaired Follows one step commands with increased time;Follows one step commands inconsistently  Cueing  Cueing Techniques Verbal cues;Tactile cues;Visual cues  Communication  Communication Impaired  Factors Affecting Communication Reduced clarity of speech;Difficulty expressing self  Upper Extremity Assessment  Upper Extremity Assessment Defer to OT evaluation  Lower Extremity Assessment  Lower Extremity Assessment Generalized weakness (R appears incrementally stronger.  Pt can mobilize in sitting against gravity,  terminal knee extension is weak.)  Cervical / Trunk Assessment  Cervical / Trunk Assessment Kyphotic  Bed Mobility  Overal bed mobility Needs Assistance  Bed Mobility Supine to Sit;Sit to Supine;Rolling  Rolling +2 for physical assistance;Max assist  Supine to sit +2 for physical assistance;Max assist  Sit to supine Total assist;+2 for physical assistance  General bed mobility comments follow instruction given time, but no initiation without assist  Transfers  Overall transfer level Needs assistance  Transfers Sit to/from Stand  Sit to Stand Mod assist;Max assist (x4)  General transfer comment assisted with face to face assist for STS x4, 2 for pericare over 30 secs and 2 STS for bed change  Ambulation/Gait  General Gait Details NT  Balance  Overall balance assessment Needs assistance  Sitting-balance support Bilateral upper extremity supported;Feet supported  Sitting balance-Leahy Scale Fair (to poor)  Sitting balance - Comments pt able to sit for 10-20 secs several times over a 8 min time sitting then sat with UE assist at other times.  Postural control Posterior lean;Left lateral lean  Standing balance support Bilateral upper extremity supported;Single extremity supported;During functional activity  Standing balance-Leahy Scale Poor  Standing balance comment stood x4 with face to face assist.  Pt too week to stand without assist.  PT - End of Session  Activity Tolerance Patient limited by fatigue  Patient left in bed;with call bell/phone within reach;with chair alarm set;with nursing/sitter in room (in a fresh bed after peri care.)  Nurse Communication Mobility status  PT Assessment  PT Recommendation/Assessment Patient needs continued PT services  PT Problem List Decreased strength;Decreased activity tolerance;Decreased balance;Decreased mobility;Decreased knowledge of use of DME;Cardiopulmonary status limiting activity;Impaired sensation  PT Plan  PT Frequency (ACUTE ONLY) Min 2X/week  PT Treatment/Interventions (ACUTE ONLY) DME instruction;Gait training;Functional mobility training;Therapeutic activities;Therapeutic exercise;Balance training;Cognitive remediation;Patient/family education  AM-PAC PT 6 Clicks Mobility Outcome Measure (Version 2)  Help needed turning from your back to your side while in a flat bed without using bedrails? 2  Help needed moving from lying on your back to sitting on the side of a flat bed without using bedrails? 2  Help needed moving to and from a bed to a chair (including a wheelchair)? 1  Help needed standing up from a chair using your arms (e.g., wheelchair or bedside chair)? 1  Help needed to walk in hospital room? 1  Help needed climbing 3-5 steps with a railing?  1  6 Click Score 8  Consider Recommendation of Discharge To: CIR/SNF/LTACH  Progressive Mobility  What is the highest level of mobility based on the mobility assessment? Level 0 (Hold) - At risk for rapid decline or arrest with movement or actively dying  Mobility Referral No  Activity Stood at bedside  PT Recommendation  Follow Up Recommendations Skilled nursing-short term rehab (<3 hours/day)  Can patient physically be transported by private vehicle No  Patient can return home with the following A lot of help with  walking and/or transfers;A lot of help with bathing/dressing/bathroom;Assistance with cooking/housework;Direct supervision/assist for medications management;Direct supervision/assist for financial management;Assist for transportation;Help with stairs or ramp for entrance  Functional Status Assessment Patient has had a recent decline in their functional status and demonstrates the ability to make significant improvements in function in a reasonable and predictable amount of time.  Individuals Consulted  Consulted and Agree with Results and Recommendations Patient  Acute Rehab PT Goals  Patient Stated Goal back home  PT Goal Formulation With patient  Time For Goal Achievement 03/09/24  Potential to Achieve Goals Good  PT Time Calculation  PT Start Time (ACUTE ONLY) 1700  PT Stop Time (ACUTE ONLY) 1723  PT Time Calculation (min) (ACUTE ONLY) 23 min  PT Evaluation  $PT Eval Moderate Complexity 1 Mod  PT Treatments  $Therapeutic Activity 8-22 mins    02/25/2024  India HERO., PT Acute Rehabilitation Services [phone]  (office)

## 2024-02-25 NOTE — Plan of Care (Signed)

## 2024-02-25 NOTE — Progress Notes (Signed)
 Progress Note   Patient: Bruce Franklin FMW:[id number] DOB: Oct 27, 1945 DOA: 02/21/2024     3 DOS: the patient was seen and examined on 02/25/2024   Brief hospital course: Bruce Franklin is a 78 y.o. male with medical history significant of COPD not on home oxygen, CAD, HFpEF, A-fib on Eliquis , hypertension, hyperlipidemia, insulin -dependent type 2 diabetes, prostate cancer, anxiety, tobacco abuse, chronic pain syndrome, oropharyngeal dysphagia underwent PEG tube placement in April 2025 and later improved and PEG tube was removed on 7/31.  Patient presents to the ED today with complaints of abdominal pain and shortness of breath.  Symptoms started about 3 days ago.  He is endorsing shortness of breath and productive cough.    He was hypoxic requiring 2-4 L [place], tachycardic. Labs with WBC count 17.1, COVID/influenza/RSV PCR negative. Chest x-ray showing patchy perihilar airspace opacities in the left upper and right lower lung zones suspicious for multifocal pneumonia. One opacity in the right lung base measures 3.2 cm and appears more lobular. Radiologist recommending follow-up chest radiograph in 6 to 12 weeks to assess resolution and exclude developing neoplasm.  CT abdomen pelvis negative for acute findings. Showing moderate colonic stool burden but no signs of bowel obstruction. Showing scattered bibasilar tiny ground glass densities, left greater than right, suspicious for pneumonia or aspiration. EKG showing sinus tachycardia, PACs, and no acute ischemic changes. Patient was given ceftriaxone , doxycycline , oxycodone , and MiraLAX  admitted to TRH service for further management.  Patient had MBS 02/23/24, intermittent aspiration seen. Allowed dysphagia diet with small sips. Prognosis poor, palliative on board.  Assessment and Plan: Severe Sepsis Multifocal pneumonia Acute hypoxic respiratory failure High risk for aspiration- Patient presented with tachycardia, tachypnea, hypoxia, high WBC.  Imaging with multifocal pneumonia, possibly aspiration related due to dysphagia, had PEG removed few months ago. Continue supplemental oxygen to maintain saturation greater than 92%. Low grade temp  noted. Continue Unasyn therapy for aspiration pneumonia. S/p MBS showed intermittent aspiration. Patient and wife does not want PEG tube. Palliative discuss goals of care, wishes to be DNR, palliative care followup as outpao Changed diet to dysphagia 3 with thick liquids. Repeat CT chest in 6-12 weeks for right sided 3.2 cm opacity. Continue to monitor vitals closely.  A-fib with RVR- Patient does have history of paroxysmal A-fib not on any rate control medications. RVR in the setting of aspiration episodes.  HR better on Cardizem  30 mg Q6 hourly with holding parameters. Continue Eliquis  therapy.    Abdominal pain secondary to constipation- Patient will be continued on scheduled MiraLAX , senna, Colace. Advised to avoid opiate medications.  Chronic HFpEF- Last echo April 2025 EF 50 to 55%, No symptoms of volume overload.  Uncontrolled type 2 diabetes mellitus- Last A1c 9.15 July 2023. Continue Accu-Cheks, sliding scale insulin  Q4 hourly as patient is NPO. He will need insulin  regimen upon discharge. Stop oral hypoglycemics.  CAD-no chest pain.  Continue aspirin, statin, beta-blocker.  Chronic pain syndrome- Patient is started on home oxycodone  therapy.  Severe malnutrition- Cachexia, BMI 18.49. Advised to stop Ozempic therapy. Discussed with wife regarding poor prognosis. He is willing to take risk of aspiration, palliative team discussed goc, he is made DNR, family agreed with palliative services as outpatient.     Out of bed to chair. Incentive spirometry. Nursing supportive care. Fall, aspiration precautions. Diet:  Diet Orders (From admission, onward)     Start     Ordered   02/24/24 1002  DIET DYS 3 Room service appropriate? Yes; Fluid consistency:  Nectar Thick  Diet  effective now       Question Answer Comment  Room service appropriate? Yes   Fluid consistency: Nectar Thick      02/24/24 1002           DVT prophylaxis:  apixaban  (ELIQUIS ) tablet 5 mg  Level of care: Progressive   Code Status: Limited: Do not attempt resuscitation (DNR) -DNR-LIMITED -Do Not Intubate/DNI   Subjective: Patient is seen and examined today morning. No family at bedside. He is weak, talking slowly. Wishes to go home. He is on 4L supplemental O2.  Physical Exam: Vitals:   02/25/24 1404 02/25/24 1412 02/25/24 1528 02/25/24 1813  BP:   114/67 130/66  Pulse: 96 92 90 97  Resp:   16 17  Temp:   98.3 F (36.8 C)   TempSrc:   Oral   SpO2: (!) 84% 92%  92%  Weight:      Height:        General - Elderly thin built ill Caucasian male, in mild respiratory distress HEENT - PERRLA, EOMI, atraumatic head, non tender sinuses. Lung - decreased breath sounds, diffuse rales, rhonchi, no wheezes. Heart - S1, S2 heard, no murmurs, rubs, no pedal edema. Abdomen - Soft, non tender, bowel sounds good Neuro - Alert, awake and oriented, non focal exam. Skin - Warm and dry.  Data Reviewed:      Latest Ref Rng & Units 02/25/2024    4:17 AM 02/24/2024    3:32 AM 02/23/2024    4:17 AM  CBC  WBC 4.0 - 10.5 K/uL 7.1  10.4  10.8   Hemoglobin 13.0 - 17.0 g/dL 85.3  84.8  85.6   Hematocrit 39.0 - 52.0 % 42.8  44.5  42.5   Platelets 150 - 400 K/uL 262  283  275       Latest Ref Rng & Units 02/25/2024    4:17 AM 02/24/2024    3:32 AM 02/23/2024    4:17 AM  BMP  Glucose 70 - 99 mg/dL 856  865  822   BUN 8 - 23 mg/dL 8  8  15    Creatinine 0.61 - 1.24 mg/dL 9.26  9.15  9.21   Sodium 135 - 145 mmol/L 136  132  134   Potassium 3.5 - 5.1 mmol/L 3.3  3.8  3.7   Chloride 98 - 111 mmol/L 104  103  108   CO2 22 - 32 mmol/L 20  20  18    Calcium  8.9 - 10.3 mg/dL 8.5  8.4  8.4    No results found.   Family Communication: Family discussed with palliative  yesterday.  Disposition: Status is: Inpatient Remains inpatient appropriate because: sick, aspiration pna, afib, IV antibiotics, poor prognosis.  Planned Discharge Destination: Home with Home Health     Time spent: 51 minutes  Author: Concepcion Riser, MD 02/25/2024 7:06 PM Secure chat 7am to 7pm For on call review [email or web address].

## 2024-02-25 NOTE — Plan of Care (Addendum)
 Alert, intermittently confused.  Converted from [place] to HFNC due to increasing oxygen demands.   Problem: Education: Goal: Ability to describe self-care measures that may prevent or decrease complications (Diabetes Survival Skills Education) will improve Outcome: Progressing Goal: Individualized Educational Video(s) Outcome: Progressing   Problem: Coping: Goal: Ability to adjust to condition or change in health will improve Outcome: Progressing   Problem: Fluid Volume: Goal: Ability to maintain a balanced intake and output will improve Outcome: Progressing   Problem: Health Behavior/Discharge Planning: Goal: Ability to identify and utilize available resources and services will improve Outcome: Progressing

## 2024-02-25 NOTE — Progress Notes (Signed)
 Patient with difficulty swallowing. Currently on Dysphagia 3 diet. Medications are crushed in applesauce.  Orders for guaifenesin  12 hr tablet, unable to crush tablet.  Discussed with Maurilio Patten [facility], new orders received.

## 2024-02-25 NOTE — Progress Notes (Signed)
 Per day shift nurse, the patient who was previously on room air desatted into the 70s. Patient required 5L oxygen on [place] to recover.

## 2024-02-25 NOTE — TOC Initial Note (Signed)
 Transition of Care [facility]) - Initial/Assessment Note    Patient Details  Name: Bruce Franklin MRN: [id number] Date of Birth: 03-03-1946  Transition of Care [facility]) CM/SW Contact:    Lendia Dais, LCSWA Phone Number: 02/25/2024, 10:38 AM  Clinical Narrative:   CSW spoke to pt at bedside and introduced self and role. CSW attempted to ask assessment questions but the pt struggled with speaking. CSW received permission to contact wife [name].      CSW spoke to [place] via phone and pt is from home and partial independent. [name] states she provides some assistance with bathing and dressing.   Pt has seen PCP in the last year and wife provides transportation.  CSW spoke to [place] about PT recs of SNF and pt's wife was agreeable. CSW explained the SNF process and [name] mention that the pt was at [facility] earlier this year. CSW explained that depending on how many days are covered by insurance, there may be a copay after those days are used.  CSW sent out referrals in the HUB and left medicare.gov list at bedside.  TOC/ICM will continue to follow.     Expected Discharge Plan: Skilled Nursing Facility Barriers to Discharge: SNF Pending bed offer, Continued Medical Work up   Patient Goals and CMS Choice Patient states their goals for this hospitalization and ongoing recovery are:: Pt stuggled with speaking CMS Medicare.gov Compare Post Acute Care list provided to:: Patient        Expected Discharge Plan and Services In-house Referral: Clinical Social Work     Living arrangements for the past 2 months: Single Family Home                                      Prior Living Arrangements/Services Living arrangements for the past 2 months: Single Family Home Lives with:: Spouse Patient language and need for interpreter reviewed:: No Do you feel safe going back to the place where you live?: Yes      Need for Family Participation in Patient Care: Yes (Comment) Care giver  support system in place?: Yes (comment) Current home services: DME Criminal Activity/Legal Involvement Pertinent to Current Situation/Hospitalization: No - Comment as needed  Activities of Daily Living   ADL Screening (condition at time of admission) Independently performs ADLs?: Yes (appropriate for developmental age) Is the patient deaf or have difficulty hearing?: No Does the patient have difficulty seeing, even when wearing glasses/contacts?: No Does the patient have difficulty concentrating, remembering, or making decisions?: No  Permission Sought/Granted Permission sought to share information with : Family Supports Permission granted to share information with : Yes, Verbal Permission Granted  Share Information with NAME: [name] Kump     Permission granted to share info w Relationship: Spouse  Permission granted to share info w Contact Information: [phone]  Emotional Assessment Appearance:: Appears stated age Attitude/Demeanor/Rapport: Engaged Affect (typically observed): Pleasant, Appropriate Orientation: : Oriented to Situation, Oriented to Self, Oriented to Place, Oriented to  Time Alcohol  / Substance Use: Not Applicable Psych Involvement: No (comment)  Admission diagnosis:  Hypoxia [R09.02] Constipation, unspecified constipation type [K59.00] Multifocal pneumonia [J18.8] Pneumonia due to infectious organism, unspecified laterality, unspecified part of lung [J18.9] Patient Active Problem List   Diagnosis Date Noted   Protein-calorie malnutrition, severe 02/25/2024   Constipation 02/22/2024   (HFpEF) heart failure with preserved ejection fraction (HCC) 02/22/2024   Atrial fibrillation with RVR (HCC) 02/22/2024  Multifocal pneumonia 02/21/2024   Pressure injury of skin 08/08/2023   DNR (do not resuscitate)/DNI(Do Not Intubate) 08/08/2023   Oropharyngeal dysphagia 08/06/2023   Severe sepsis (HCC) 08/03/2023   CAD (coronary artery disease) 08/03/2023   Chronic  heart failure with mildly reduced ejection fraction (HFmrEF, 41-49%) (HCC) 08/03/2023   Chronic prescription opiate use 08/03/2023   Paroxysmal atrial fibrillation (HCC) 08/03/2023   Benign essential hypertension 08/15/2018   COPD (chronic obstructive pulmonary disease) (HCC)    Diabetes mellitus without complication (HCC)    Prostate cancer (HCC)    Type 2 diabetes mellitus without complication    Malnutrition of moderate degree (HCC) 11/15/2014   Sepsis (HCC) 11/14/2014   TOBACCO DEPENDENCE 06/09/2006   NEPHROLITHIASIS 06/09/2006   BACK PAIN, LOW 06/09/2006   PCP:  Dalbert Standing, MD Pharmacy:   [facility] - [place], [place] - [address] [place] [address] Phone: [phone] Fax: [phone]  [id number] - [place], [place] - [address] [place] [address] Phone: [phone] Fax: [phone]     Social Drivers of Health (SDOH) Social History: SDOH Screenings   Food Insecurity: Patient Declined (02/22/2024)  Housing: Patient Declined (02/22/2024)  Transportation Needs: Patient Declined (02/22/2024)  Utilities: Patient Declined (02/22/2024)  Social Connections: Patient Declined (02/22/2024)  Tobacco Use: High Risk (02/21/2024)   SDOH Interventions:     Readmission Risk Interventions     No data to display

## 2024-02-25 NOTE — Progress Notes (Signed)
 Patient with desaturations, placed on HFNC with improved oxygen saturations.     02/25/24 1404  Vitals  Pulse Rate 96  ECG Heart Rate 96  MEWS COLOR  MEWS Score Color Green  Oxygen Therapy  SpO2 (!) 84 %  O2 Device Nasal Cannula  O2 Flow Rate (L/min) 4 L/min  MEWS Score  MEWS Temp 0  MEWS Systolic 0  MEWS Pulse 0  MEWS RR 0  MEWS LOC 0  MEWS Score 0       02/25/24 1412  Vitals  Pulse Rate 92  ECG Heart Rate 93  MEWS COLOR  MEWS Score Color Green  Oxygen Therapy  SpO2 92 %  O2 Device HFNC  O2 Flow Rate (L/min) 6 L/min

## 2024-02-25 NOTE — [place] FL2 (Signed)
 [place]  MEDICAID FL2 LEVEL OF CARE FORM     IDENTIFICATION  Patient Name: Bruce Franklin Birthdate: 23-Nov-1945 Sex: male Admission Date (Current Location): 02/21/2024  [facility] and [place] Number:  Producer, Television/film/video and Address:  [facility] [place], [address], [place], [place] [address]      Provider Number: [id number]  Attending Physician Name and Address:  Darci Pore, MD  Relative Name and Phone Number:  Mcdermid,[name] (Spouse)  [phone]    Current Level of Care: Hospital Recommended Level of Care: Skilled Nursing Facility Prior Approval Number:    Date Approved/Denied:   PASRR Number: [id number]  Discharge Plan: SNF    Current Diagnoses: Patient Active Problem List   Diagnosis Date Noted   Protein-calorie malnutrition, severe 02/25/2024   Constipation 02/22/2024   (HFpEF) heart failure with preserved ejection fraction (HCC) 02/22/2024   Atrial fibrillation with RVR (HCC) 02/22/2024   Multifocal pneumonia 02/21/2024   Pressure injury of skin 08/08/2023   DNR (do not resuscitate)/DNI(Do Not Intubate) 08/08/2023   Oropharyngeal dysphagia 08/06/2023   Severe sepsis (HCC) 08/03/2023   CAD (coronary artery disease) 08/03/2023   Chronic heart failure with mildly reduced ejection fraction (HFmrEF, 41-49%) (HCC) 08/03/2023   Chronic prescription opiate use 08/03/2023   Paroxysmal atrial fibrillation (HCC) 08/03/2023   Benign essential hypertension 08/15/2018   COPD (chronic obstructive pulmonary disease) (HCC)    Diabetes mellitus without complication (HCC)    Prostate cancer (HCC)    Type 2 diabetes mellitus without complication    Malnutrition of moderate degree (HCC) 11/15/2014   Sepsis (HCC) 11/14/2014   TOBACCO DEPENDENCE 06/09/2006   NEPHROLITHIASIS 06/09/2006   BACK PAIN, LOW 06/09/2006    Orientation RESPIRATION BLADDER Height & Weight     Self, Time, Situation, Place  Normal Incontinent Weight: 147 lb 14.9 oz  (67.1 kg) (from July 2025 records) Height:  6' 3 (190.5 cm)  BEHAVIORAL SYMPTOMS/MOOD NEUROLOGICAL BOWEL NUTRITION STATUS      Incontinent Diet (see dc summary)  AMBULATORY STATUS COMMUNICATION OF NEEDS Skin   Extensive Assist Verbally Other (Comment) (redness/skin tear)                       Personal Care Assistance Level of Assistance  Bathing, Feeding, Dressing Bathing Assistance: Maximum assistance Feeding assistance: Maximum assistance Dressing Assistance: Maximum assistance     Functional Limitations Info  Sight, Hearing, Speech Sight Info: Impaired Hearing Info: Adequate Speech Info: Impaired    SPECIAL CARE FACTORS FREQUENCY  PT (By licensed PT), OT (By licensed OT)     PT Frequency: 5x/wk OT Frequency: 5x/wk            Contractures Contractures Info: Not present    Additional Factors Info  Code Status, Allergies Code Status Info: DNR Allergies Info: Ibuprofen  Sulfa Antibiotics           Current Medications (02/25/2024):  This is the current hospital active medication list Current Facility-Administered Medications  Medication Dose Route Frequency Provider Last Rate Last Admin   (feeding supplement) PROSource Plus liquid 30 mL  30 mL Oral BID BM Sreeram, Narendranath, MD   30 mL at 02/25/24 1006   acetaminophen  (TYLENOL ) tablet 650 mg  650 mg Oral Q6H PRN Rathore, Vasundhra, MD   650 mg at 02/22/24 0840   Or   acetaminophen  (TYLENOL ) suppository 650 mg  650 mg Rectal Q6H PRN Alfornia Madison, MD       ALPRAZolam  (XANAX ) tablet 0.5  mg  0.5 mg Oral TID PRN Darci Pore, MD   0.5 mg at 02/23/24 2138   Ampicillin-Sulbactam (UNASYN) 3 g in sodium chloride  0.9 % 100 mL IVPB  3 g Intravenous Q6H Mattie Marvetta SQUIBB, RPH 200 mL/hr at 02/25/24 0604 3 g at 02/25/24 9395   apixaban  (ELIQUIS ) tablet 5 mg  5 mg Oral BID Sreeram, Narendranath, MD   5 mg at 02/25/24 1005   Chlorhexidine  Gluconate Cloth 2 % PADS 6 each  6 each Topical Daily Darci Pore, MD   6 each at 02/25/24 1005   diltiazem  (CARDIZEM ) tablet 30 mg  30 mg Oral Q6H Sreeram, Pore, MD   30 mg at 02/25/24 9395   guaiFENesin  tablet 200 mg  200 mg Oral BID Sreeram, Narendranath, MD       insulin  aspart (novoLOG ) injection 0-5 Units  0-5 Units Subcutaneous QHS Opyd, Timothy S, MD       insulin  aspart (novoLOG ) injection 0-9 Units  0-9 Units Subcutaneous TID WC Opyd, Timothy S, MD   2 Units at 02/25/24 0842   levalbuterol (XOPENEX) nebulizer solution 0.63 mg  0.63 mg Nebulization Q6H PRN Rathore, Vasundhra, MD       mupirocin ointment (BACTROBAN) 2 %   Nasal BID Sreeram, Narendranath, MD   Given at 02/25/24 1008   oxyCODONE  (Oxy IR/ROXICODONE ) immediate release tablet 10 mg  10 mg Oral Q6H PRN Sreeram, Narendranath, MD   10 mg at 02/25/24 0605   polyethylene glycol (MIRALAX  / GLYCOLAX ) packet 17 g  17 g Oral Daily Alfornia Madison, MD   17 g at 02/22/24 1211   prochlorperazine (COMPAZINE) injection 5 mg  5 mg Intravenous Q6H PRN Opyd, Evalene RAMAN, MD       senna-docusate (Senokot-S) tablet 1 tablet  1 tablet Oral BID Sreeram, Narendranath, MD   1 tablet at 02/25/24 1005     Discharge Medications: Please see discharge summary for a list of discharge medications.  Relevant Imaging Results:  Relevant Lab Results:   Additional Information SSN [id number]  [place], LCSWA

## 2024-02-26 ENCOUNTER — Inpatient Hospital Stay (HOSPITAL_COMMUNITY)

## 2024-02-26 DIAGNOSIS — J188 Other pneumonia, unspecified organism: Secondary | ICD-10-CM | POA: Diagnosis not present

## 2024-02-26 LAB — COMPREHENSIVE METABOLIC PANEL WITH GFR
ALT: 28 U/L (ref 0–44)
AST: 27 U/L (ref 15–41)
Albumin: 2.4 g/dL — ABNORMAL LOW (ref 3.5–5.0)
Alkaline Phosphatase: 40 U/L (ref 38–126)
Anion gap: 12 (ref 5–15)
BUN: 9 mg/dL (ref 8–23)
CO2: 22 mmol/L (ref 22–32)
Calcium: 8.7 mg/dL — ABNORMAL LOW (ref 8.9–10.3)
Chloride: 103 mmol/L (ref 98–111)
Creatinine, Ser: 0.7 mg/dL (ref 0.61–1.24)
GFR, Estimated: >60 mL/min (ref >60)
Glucose, Bld: 120 mg/dL — ABNORMAL HIGH (ref 70–99)
Potassium: 3.5 mmol/L (ref 3.5–5.1)
Sodium: 137 mmol/L (ref 135–145)
Total Bilirubin: 0.4 mg/dL (ref 0.0–1.2)
Total Protein: 6.5 g/dL (ref 6.5–8.1)

## 2024-02-26 LAB — CBC
HCT: 44.3 % (ref 39.0–52.0)
Hemoglobin: 14.9 g/dL (ref 13.0–17.0)
MCH: 30.1 pg (ref 26.0–34.0)
MCHC: 33.6 g/dL (ref 30.0–36.0)
MCV: 89.5 fL (ref 80.0–100.0)
Platelets: 259 K/uL (ref 150–400)
RBC: 4.95 MIL/uL (ref 4.22–5.81)
RDW: 13.1 % (ref 11.5–15.5)
WBC: 8.6 K/uL (ref 4.0–10.5)
nRBC: 0 % (ref 0.0–0.2)

## 2024-02-26 LAB — GLUCOSE, CAPILLARY
Glucose-Capillary: 158 mg/dL — ABNORMAL HIGH (ref 70–99)
Glucose-Capillary: 190 mg/dL — ABNORMAL HIGH (ref 70–99)
Glucose-Capillary: 172 mg/dL — ABNORMAL HIGH (ref 70–99)
Glucose-Capillary: 126 mg/dL — ABNORMAL HIGH (ref 70–99)

## 2024-02-26 MED ORDER — MORPHINE SULFATE (PF) 2 MG/ML IV SOLN
2.0000 mg | INTRAVENOUS | Status: DC | PRN
  Administered 2024-02-26 (×2): 2 mg via INTRAVENOUS
  Filled 2024-02-26 (×2): qty 1

## 2024-02-26 MED ORDER — MORPHINE SULFATE (PF) 2 MG/ML IV SOLN
2.0000 mg | INTRAVENOUS | Status: DC | PRN
  Administered 2024-02-26 – 2024-02-28 (×5): 2 mg via INTRAVENOUS
  Filled 2024-02-26 (×5): qty 1

## 2024-02-26 MED ORDER — METOPROLOL TARTRATE 5 MG/5ML IV SOLN
2.5000 mg | INTRAVENOUS | Status: DC | PRN
  Administered 2024-02-26: 2.5 mg via INTRAVENOUS
  Filled 2024-02-26: qty 5

## 2024-02-26 MED ORDER — DEXTROSE IN LACTATED RINGERS 5 % IV SOLN: INTRAVENOUS | Status: AC

## 2024-02-26 MED ORDER — LIDOCAINE 5 % EX PTCH
3.0000 | MEDICATED_PATCH | CUTANEOUS | Status: DC
  Administered 2024-02-26 – 2024-02-28 (×3): 3 via TRANSDERMAL
  Filled 2024-02-26 (×4): qty 3

## 2024-02-26 NOTE — TOC Progression Note (Addendum)
 Transition of Care [facility]) - Progression Note    Patient Details  Name: Bruce Franklin MRN: [id number] Date of Birth: 1945/10/21  Transition of Care [facility]) CM/SW Contact  Isaiah Public, LCSWA Phone Number: 02/26/2024, 9:08 AM  Clinical Narrative:     CSW spoke with patients spouse [name] and provided patients SNF bed offers.Patients spouse informed CSW that number one choice for patient is [facility].   CSW informed patients spouse that previous CSW faxed out near the [place] area.Patients spouse gave CSW permission to fax out patients initial referral near the [place] area for possible SNF rehab offers. CSW informed patients spouse that once additional SNF bed offers are in that CSW will give her a call back. All questions answered. No further questions reported at this time.CSW called and spoke with [name] with [facility] and let her know that CSW just sent referral for review. [name] is going to review and give CSW a call back on if facility can offer SNF bed for patient. CSW will continue to follow.  Update- CSW received call back from Bratenahl with [facility] who confirmed she can make SNF bed offer for patient. CSW informed [name] patients spouse.[name] informed CSW there is another facility in [place] that she may want patient to go too. [name] said she is going to go to [place] today to see which facility it was she was thinking of. [name] plans on following back up with CSW today with the name of facility. CSW will continue to follow.  Update- [name] followed back up with CSW and informed CSW that top two choices for patient are now [facility] and [facility]. [name] informed CSW number once choice is Scientist, research (medical). CSW LVM for [name] with [facility] re[facility]. CSW awaiting call back to see if facility can make bed offer for patient.  Update- [name] with [facility] informed CSW unable to offer SNF bed for patient. Facility not in network with patients insurance. CSW awaiting to hear back from [name]  with [facility] re[facility].  Expected Discharge Plan: Skilled Nursing Facility Barriers to Discharge: SNF Pending bed offer, Continued Medical Work up               Expected Discharge Plan and Services In-house Referral: Clinical Social Work     Living arrangements for the past 2 months: Single Family Home                                       Social Drivers of Health (SDOH) Interventions SDOH Screenings   Food Insecurity: Patient Declined (02/22/2024)  Housing: Patient Declined (02/22/2024)  Transportation Needs: Patient Declined (02/22/2024)  Utilities: Patient Declined (02/22/2024)  Social Connections: Patient Declined (02/22/2024)  Tobacco Use: High Risk (02/21/2024)    Readmission Risk Interventions     No data to display

## 2024-02-26 NOTE — Progress Notes (Signed)
 Patient is not on 12L high flow nasal cannula. Patient states he generally do not feel well. He states that he is having pain in his right shoulder, jaw, rib cage. He states none of these areas are new sights of pain. Patient states that he is having no chest. See vitals in chart.

## 2024-02-26 NOTE — Progress Notes (Signed)
 Speech Language Pathology Treatment: Dysphagia  Patient Details Name: Bruce Franklin MRN: [id number] DOB: October 17, 1945 Today's Date: 02/26/2024 Time: 9094-9054 SLP Time Calculation (min) (ACUTE ONLY): 40 min  Assessment / Plan / Recommendation Clinical Impression  PLAN: Recommend NPO due to worsening swallow function and ability to tolerate aspiration. Spoke with Palliative care, who will f/u next date.  May give critical meds crushed with puree; hold if coughing.  Allow ice chips after oral care.  D/W MD and RN.   Impression: Bruce Franklin presents with further decreased reserve to manage aspiration.  Currently on HFNC at 12L.  I assisted him with breakfast; did not provide any liquids from tray.  He coughed excessively with all solids, despite assistance with pacing/quantity and swallowing of a variety of solid textures. He acknowledged that eating was more difficult.   Called Bruce Franklin and discussed concerns for inability to tolerate POs- she states that pt has always coughed when eating. I explained that he was able to tolerate some aspiration prior to this hospitalization, but now his lungs aren't able to handle aspiration and he is having more difficulty breathing as a result.  We discussed changing to NPO for now until she speaks with Palliative Care again.  (I reached out to Palliative, if schedule allows, they will f/u today. Otherwise he will be seen tomorrow.)  Oral suctioning set-up in room.  SLP will f/u next date.    HPI HPI: Bruce Franklin is a 78 y.o. male admitted 02/21/24 with sepsis, multifocal pna, suspected aspiration, acute respiratory failure.  MBS 11/13: intermittent aspiration thin and nectar thick liquids due to delays in onset of the swallow response.  Both silent and sensed aspiration. Performance c/w prior MBS studies.  Pt/wife met with Palliative Care 11/14; plan is to continue POs with known risk of recurring pna due to aspiration. PMHx COPD not on home oxygen, CAD,  HFpEF, A-fib on Eliquis , hypertension, hyperlipidemia, insulin -dependent type 2 diabetes, prostate cancer, anxiety, tobacco abuse, chronic pain syndrome, oropharyngeal dysphagia -PEG tube 08/10/23; removed 7/31.  MBS 08/05/23 and 08/08/23 with significant dysphagia, DIGEST score of 3 severe dysfunction. Aspiration occurred due to mistiming of the swallow, altered mentation.      SLP Plan  Continue with current plan of care          Recommendations  Diet recommendations: NPO Medication Administration: Crushed with puree                  Oral care QID;Oral care prior to ice chip/H20           Continue with current plan of care    Bruce Franklin L. [place], MA CCC/SLP Clinical Specialist - Acute Care SLP Acute Rehabilitation Services Office number [phone]  Bruce Franklin  02/26/2024, 10:11 AM

## 2024-02-26 NOTE — Progress Notes (Signed)
 PROGRESS NOTE    Bruce Franklin  FMW:[id number] DOB: 1945-04-21 DOA: 02/21/2024 PCP: Dalbert Standing, MD  Chief Complaint  Patient presents with   Emesis    Brief Narrative:   Bruce Franklin is Bruce Franklin 78 y.o. male with medical history significant of COPD not on home oxygen, CAD, HFpEF, Pearlene Teat-fib on Eliquis , hypertension, hyperlipidemia, insulin -dependent type 2 diabetes, prostate cancer, anxiety, tobacco abuse, chronic pain syndrome, oropharyngeal dysphagia underwent PEG tube placement in April 2025 and later improved and PEG tube was removed on 7/31.  Patient presents to the ED today with complaints of abdominal pain and shortness of breath.  Symptoms started about 3 days ago.  He is endorsing shortness of breath and productive cough.     He was hypoxic requiring 2-4 L [place], tachycardic. Labs with WBC count 17.1, COVID/influenza/RSV PCR negative. Chest x-ray showing patchy perihilar airspace opacities in the left upper and right lower lung zones suspicious for multifocal pneumonia. One opacity in the right lung base measures 3.2 cm and appears more lobular. Radiologist recommending follow-up chest radiograph in 6 to 12 weeks to assess resolution and exclude developing neoplasm.  CT abdomen pelvis negative for acute findings. Showing moderate colonic stool burden but no signs of bowel obstruction. Showing scattered bibasilar tiny ground glass densities, left greater than right, suspicious for pneumonia or aspiration. EKG showing sinus tachycardia, PACs, and no acute ischemic changes. Patient was given ceftriaxone , doxycycline , oxycodone , and MiraLAX  admitted to TRH service for further management.   Patient had MBS 02/23/24, intermittent aspiration seen. Allowed dysphagia diet with small sips. Prognosis poor, palliative on board.  Assessment & Plan:   Principal Problem:   Multifocal pneumonia Active Problems:   Severe sepsis (HCC)   CAD (coronary artery disease)   Sepsis (HCC)   Constipation    (HFpEF) heart failure with preserved ejection fraction (HCC)   Atrial fibrillation with RVR (HCC)   Protein-calorie malnutrition, severe  Goals of Care Worsening hypoxia overnight, suspected recurrent aspiration SLP recommending NPO given aspiration/coughing with all solids Discussed my concern with regards to his ongoing aspiration - discussed right now he's not well enough to discharge.  Family was discussing d/c to SNF today, but he's not currently well enough for this.  Will monitor how he does with NPO status/abx.  If not improving, will need to continue to address GOC, consider more comfort focused care/hospice.  Appreciate palliative care assistance with GOC discussions.   Acute Hypoxic Respiratory Failure Aspiration Pneumonia  Multifocal Pneumonia Currently on 10 L [place] CXR this AM with progressive LUL airspace disease and new L lung base opacification, possible small L effusion Continue antibiotics with unasyn Appreciate SLP following, currently NPO   Dysphagia  Aspiration Risk NPO at this time SLP following IVF  Atrial Fibrillation with RVR Continue diltiazem  q6 Eliquis    Abdominal Discomfort CT with moderate colonic stool burden  HFpEF Appears euvolemic  T2DM SSI  CAD Noted  Chronic Pain Getting IV pain meds now  Severe Protein Calorie Malnutrition Stop ozempic     DVT prophylaxis: elqiuis Code Status: DNR Family Communication: wife, sister in law 11/16 Disposition:   Status is: Inpatient Remains inpatient appropriate because: need for continued inpatient care   Consultants:  Palliative care  Procedures:  none  Antimicrobials:  Anti-infectives (From admission, onward)    Start     Dose/Rate Route Frequency Ordered Stop   02/22/24 0600  Ampicillin-Sulbactam (UNASYN) 3 g in sodium chloride  0.9 % 100 mL IVPB  3 g 200 mL/hr over 30 Minutes Intravenous Every 6 hours 02/21/24 2358     02/21/24 2115  cefTRIAXone  (ROCEPHIN ) 1 g in sodium  chloride 0.9 % 100 mL IVPB        1 g 200 mL/hr over 30 Minutes Intravenous  Once 02/21/24 2112 02/21/24 2230   02/21/24 2115  doxycycline  (VIBRA -TABS) tablet 100 mg        100 mg Oral  Once 02/21/24 2112 02/21/24 2128       Subjective: Wife, sister in law at bedside Asking for blanket, medicine for pain Discussed concern he aspirated again  He notes he wants to go home  Objective: Vitals:   02/26/24 0751 02/26/24 1010 02/26/24 1015 02/26/24 1129  BP: 129/69 (!) 141/60  120/81  Pulse: 74 85 74 96  Resp: 17 17  18   Temp: (!) 97.5 F (36.4 C)   97.8 F (36.6 C)  TempSrc: Oral   Oral  SpO2: 98% 99% 96% 93%  Weight:      Height:        Intake/Output Summary (Last 24 hours) at 02/26/2024 1524 Last data filed at 02/26/2024 1430 Gross per 24 hour  Intake --  Output 950 ml  Net -950 ml   Filed Weights   02/21/24 2315  Weight: 67.1 kg    Examination:  General exam: frail, ill appearing Respiratory system: on 10 L [place], bilateral anterior rhonchi Cardiovascular system: RRR Gastrointestinal system: Abdomen is nondistended, soft and nontender. Central nervous system: Alert - difficult to understand. No focal neurological deficits. Extremities: no LEE   Data Reviewed: I have personally reviewed following labs and imaging studies  CBC: Recent Labs  Lab 02/21/24 1525 02/22/24 0208 02/23/24 0417 02/24/24 0332 02/25/24 0417  WBC 17.1* 13.6* 10.8* 10.4 7.1  HGB 16.3 15.0 14.3 15.1 14.6  HCT 49.6 44.5 42.5 44.5 42.8  MCV 90.8 89.5 89.1 89.0 87.5  PLT 342 322 275 283 262    Basic Metabolic Panel: Recent Labs  Lab 02/21/24 1525 02/23/24 0417 02/24/24 0332 02/25/24 0417  NA 138 134* 132* 136  K 4.6 3.7 3.8 3.3*  CL 105 108 103 104  CO2 22 18* 20* 20*  GLUCOSE 224* 177* 134* 143*  BUN 23 15 8 8   CREATININE 1.12 0.78 0.84 0.73  CALCIUM  9.5 8.4* 8.4* 8.5*    GFR: Estimated Creatinine Clearance: 72.2 mL/min (by C-G formula based on SCr of 0.73  mg/dL).  Liver Function Tests: Recent Labs  Lab 02/21/24 1525  AST 14*  ALT 17  ALKPHOS 50  BILITOT 0.9  PROT 7.9  ALBUMIN 3.3*    CBG: Recent Labs  Lab 02/25/24 1110 02/25/24 1737 02/25/24 2107 02/26/24 0750 02/26/24 1156  GLUCAP 95 235* 133* 158* 190*     Recent Results (from the past 240 hours)  Resp panel by RT-PCR (RSV, Flu Ireanna Finlayson&B, Covid) Anterior Nasal Swab     Status: None   Collection Time: 02/21/24  3:25 PM   Specimen: Anterior Nasal Swab  Result Value Ref Range Status   SARS Coronavirus 2 by RT PCR NEGATIVE NEGATIVE Final   Influenza Guillermo Nehring by PCR NEGATIVE NEGATIVE Final   Influenza B by PCR NEGATIVE NEGATIVE Final    Comment: (NOTE) The Xpert Xpress SARS-CoV-2/FLU/RSV plus assay is intended as an aid in the diagnosis of influenza from Nasopharyngeal swab specimens and should not be used as Rhiley Tarver sole basis for treatment. Nasal washings and aspirates are unacceptable for Xpert Xpress SARS-CoV-2/FLU/RSV testing.  Fact Sheet  for Patients: bloggercourse.com  Fact Sheet for Healthcare Providers: seriousbroker.it  This test is not yet approved or cleared by the [place]  FDA and has been authorized for detection and/or diagnosis of SARS-CoV-2 by FDA under an Emergency Use Authorization (EUA). This EUA will remain in effect (meaning this test can be used) for the duration of the COVID-19 declaration under Section 564(b)(1) of the Act, 21 U.S.C. section 360bbb-3(b)(1), unless the authorization is terminated or revoked.     Resp Syncytial Virus by PCR NEGATIVE NEGATIVE Final    Comment: (NOTE) Fact Sheet for Patients: bloggercourse.com  Fact Sheet for Healthcare Providers: seriousbroker.it  This test is not yet approved or cleared by the [place]  FDA and has been authorized for detection and/or diagnosis of SARS-CoV-2 by FDA under an Emergency Use  Authorization (EUA). This EUA will remain in effect (meaning this test can be used) for the duration of the COVID-19 declaration under Section 564(b)(1) of the Act, 21 U.S.C. section 360bbb-3(b)(1), unless the authorization is terminated or revoked.  Performed at [facility], [address], [place], [place] [address]   Culture, blood (Routine X 2) w Reflex to ID Panel     Status: None (Preliminary result)   Collection Time: 02/22/24 12:00 AM   Specimen: BLOOD  Result Value Ref Range Status   Specimen Description BLOOD SITE NOT SPECIFIED  Final   Special Requests   Final    BOTTLES DRAWN AEROBIC AND ANAEROBIC Blood Culture results may not be optimal due to an inadequate volume of blood received in culture bottles   Culture   Final    NO GROWTH 4 DAYS Performed at [facility], [address], [place], [place] [address]    Report Status PENDING  Incomplete  Culture, blood (Routine X 2) w Reflex to ID Panel     Status: None (Preliminary result)   Collection Time: 02/22/24  2:08 AM   Specimen: BLOOD  Result Value Ref Range Status   Specimen Description BLOOD SITE NOT SPECIFIED  Final   Special Requests   Final    BOTTLES DRAWN AEROBIC AND ANAEROBIC Blood Culture adequate volume   Culture   Final    NO GROWTH 4 DAYS Performed at [facility], [address] [place], [place] [address]    Report Status PENDING  Incomplete  MRSA Next Gen by PCR, Nasal     Status: Abnormal   Collection Time: 02/22/24  6:05 AM   Specimen: Nasal Mucosa; Nasal Swab  Result Value Ref Range Status   MRSA by PCR Next Gen DETECTED ([place]) NOT DETECTED Final    Comment: RESULT CALLED TO, READ BACK BY AND VERIFIED WITH: DOROTHA NATION RN, AT [phone] 02/22/24 D. VANHOOK (NOTE) The GeneXpert MRSA Assay (FDA approved for NASAL specimens only), is one component of Leul Narramore comprehensive MRSA colonization surveillance program. It is not intended to diagnose MRSA infection nor to guide or monitor treatment for  MRSA infections. Test performance is not FDA approved in patients less than 11 years old. Performed at [facility], [address], [place], [place] [address]          Radiology Studies: DG CHEST PORT 1 VIEW Result Date: 02/26/2024 EXAM: 1 VIEW(S) XRAY OF THE CHEST 02/26/2024 07:44:00 AM COMPARISON: 02/21/2024 CLINICAL HISTORY: Acute respiratory failure with hypoxia (HCC) [id number] FINDINGS: LUNGS AND PLEURA: Progressive left upper lobe airspace disease. New left lung base opacification. Possible small left pleural effusion. Emphysema. No pneumothorax. HEART  AND MEDIASTINUM: Aortic atherosclerosis. BONES AND SOFT TISSUES: No acute osseous abnormality. IMPRESSION: 1. Progressive left upper lobe airspace disease and new left lung base opacification, likely infectious or inflammatory in etiology. 2. Possible small left pleural effusion. Electronically signed by: Lonni Necessary MD 02/26/2024 11:31 AM EST RP Workstation: [id number]        Scheduled Meds:  (feeding supplement) PROSource Plus  30 mL Oral BID BM   apixaban   5 mg Oral BID   Chlorhexidine  Gluconate Cloth  6 each Topical Daily   diltiazem   30 mg Oral Q6H   guaiFENesin   200 mg Oral BID   insulin  aspart  0-5 Units Subcutaneous QHS   insulin  aspart  0-9 Units Subcutaneous TID WC   lidocaine   3 patch Transdermal Q24H   mupirocin ointment   Nasal BID   polyethylene glycol  17 g Oral Daily   senna-docusate  1 tablet Oral BID   Continuous Infusions:  ampicillin-sulbactam (UNASYN) IV 3 g (02/26/24 1130)   dextrose  5% lactated ringers  75 mL/hr at 02/26/24 1514     LOS: 4 days    Time spent: over 30 min     Meliton Monte, MD [facility]   To contact the attending provider between 7A-7P or the covering provider during after hours 7P-7A, please log into the web site [email or web address] and access using universal [place] password for that web site. If you do not have the password, please call the hospital  operator.  02/26/2024, 3:24 PM

## 2024-02-26 NOTE — Progress Notes (Signed)
 MD made aware of the changes in the Fio2 and the patient's sats throughout the evening, cxr order placed.

## 2024-02-26 NOTE — Progress Notes (Signed)
 Patient complaining of pain in various locations throughout shift, most common at the back and/or coccyx. At 1620pm, patient c/o pain indicated at the ribs and HR 160. EKG was completed, patient hemodynamically stable, morphine  administered, and  Xanax  administered. At 1634, patients heart rate resolved to 99. At 1642, patient's HR returned to 130-160bpm. Patient denies pain and endorses feeling comfortable in current lying position. Denies the need to void or to have BM. RN in communication with MD throughout event. At 1718, Metoprolol  2.5mg  IV administered per MD ordered. Five minutes after administration of Metoprolol  (1732pm), patient HR sustaining in 120-130s; BP 135/85 (98). Per MD, administer Metoprolol  2.5mg  IV if HR remains sustained greater than 120bpm. At 1728, HR 88, SR with PAC (per CCMD). BP 132/83 (100). No further dose of Metoprolol  administered.

## 2024-02-27 DIAGNOSIS — J188 Other pneumonia, unspecified organism: Secondary | ICD-10-CM | POA: Diagnosis not present

## 2024-02-27 LAB — CBC WITH DIFFERENTIAL/PLATELET
Abs Immature Granulocytes: 0.03 K/uL (ref 0.00–0.07)
Basophils Absolute: 0.1 K/uL (ref 0.0–0.1)
Basophils Relative: 1 %
Eosinophils Absolute: 0.1 K/uL (ref 0.0–0.5)
Eosinophils Relative: 1 %
HCT: 45.8 % (ref 39.0–52.0)
Hemoglobin: 15.3 g/dL (ref 13.0–17.0)
Immature Granulocytes: 0 %
Lymphocytes Relative: 18 %
Lymphs Abs: 1.5 K/uL (ref 0.7–4.0)
MCH: 29.7 pg (ref 26.0–34.0)
MCHC: 33.4 g/dL (ref 30.0–36.0)
MCV: 88.9 fL (ref 80.0–100.0)
Monocytes Absolute: 0.6 K/uL (ref 0.1–1.0)
Monocytes Relative: 7 %
Neutro Abs: 6.3 K/uL (ref 1.7–7.7)
Neutrophils Relative %: 73 %
Platelets: 264 K/uL (ref 150–400)
RBC: 5.15 MIL/uL (ref 4.22–5.81)
RDW: 13 % (ref 11.5–15.5)
WBC: 8.6 K/uL (ref 4.0–10.5)
nRBC: 0 % (ref 0.0–0.2)

## 2024-02-27 LAB — CULTURE, BLOOD (ROUTINE X 2)
Culture: NO GROWTH
Culture: NO GROWTH
Special Requests: ADEQUATE

## 2024-02-27 LAB — COMPREHENSIVE METABOLIC PANEL WITH GFR
ALT: 26 U/L (ref 0–44)
AST: 22 U/L (ref 15–41)
Albumin: 2.5 g/dL — ABNORMAL LOW (ref 3.5–5.0)
Alkaline Phosphatase: 45 U/L (ref 38–126)
Anion gap: 12 (ref 5–15)
BUN: 6 mg/dL — ABNORMAL LOW (ref 8–23)
CO2: 22 mmol/L (ref 22–32)
Calcium: 8.9 mg/dL (ref 8.9–10.3)
Chloride: 102 mmol/L (ref 98–111)
Creatinine, Ser: 0.73 mg/dL (ref 0.61–1.24)
GFR, Estimated: >60 mL/min (ref >60)
Glucose, Bld: 142 mg/dL — ABNORMAL HIGH (ref 70–99)
Potassium: 3.6 mmol/L (ref 3.5–5.1)
Sodium: 136 mmol/L (ref 135–145)
Total Bilirubin: 0.8 mg/dL (ref 0.0–1.2)
Total Protein: 6.9 g/dL (ref 6.5–8.1)

## 2024-02-27 LAB — PHOSPHORUS: Phosphorus: 2.7 mg/dL (ref 2.5–4.6)

## 2024-02-27 LAB — GLUCOSE, CAPILLARY
Glucose-Capillary: 194 mg/dL — ABNORMAL HIGH (ref 70–99)
Glucose-Capillary: 153 mg/dL — ABNORMAL HIGH (ref 70–99)
Glucose-Capillary: 185 mg/dL — ABNORMAL HIGH (ref 70–99)
Glucose-Capillary: 217 mg/dL — ABNORMAL HIGH (ref 70–99)

## 2024-02-27 LAB — MAGNESIUM: Magnesium: 1.7 mg/dL (ref 1.7–2.4)

## 2024-02-27 NOTE — Progress Notes (Signed)
 Nutrition Follow-up  DOCUMENTATION CODES:   Severe malnutrition in context of chronic illness, Underweight  INTERVENTION:  NPO with ice chips  Resume PO diet if within GOC, advancement/consistency per SLP. Magic cup TID with meals, each supplement provides 290 kcal and 9 grams of protein. 1 packet ProSource Plus BID. Each supplement provides 100 Kcals and 15 g protein.  NUTRITION DIAGNOSIS:   Severe Malnutrition related to chronic illness as evidenced by severe muscle depletion, severe fat depletion.   GOAL:   Patient will meet greater than or equal to 90% of their needs, Weight gain   MONITOR:   PO intake, Supplement acceptance, Diet advancement, Skin, Labs, Weight trends  REASON FOR ASSESSMENT:    (pressure injury)    ASSESSMENT:   Patient presented with shortness of breath and abdominal pain and was found to have PNA suspicious for aspiration, sepsis and constipation. PMH significant for DM2 uncontrolled, dysphagia s/p PEG 07/2023 removed 11/10/23, COPD, CAD, HFpEF, Afib, HTN, dyslipidemia, prostate cancer, anxiety, tobacco use, chronic pain syndrome, cholelithiasis s/p cholecystectomy 03/2016, and acute biliary pancreatitis s/p ERCP 03/2016.  Patient reevaluated by SLP yesterday, made NPO due to worsening swallow function and ability to tolerate aspiration. Plan for spouse to discuss GOC with palliative today to discuss PO diet. ONS held due to NPO status. Per previous RD note, patient does not want a PEG at this time. Pt will discharge to SNF once medically stable.   Scheduled Meds:  (feeding supplement) PROSource Plus  30 mL Oral BID BM   insulin  aspart  0-5 Units Subcutaneous QHS   insulin  aspart  0-9 Units Subcutaneous TID WC   polyethylene glycol  17 g Oral Daily   senna-docusate  1 tablet Oral BID   Continuous Infusions:  ampicillin-sulbactam (UNASYN) IV 200 mL/hr at 02/27/24 0600   dextrose  5% lactated ringers  75 mL/hr at 02/27/24 0657   PRN  Meds:.acetaminophen  **OR** acetaminophen , ALPRAZolam , levalbuterol, metoprolol  tartrate, morphine  injection, prochlorperazine  Diet Order             Diet NPO time specified Except for: Ice Chips  Diet effective now                    Labs:   Net IO Since Admission: -164.35 mL [02/27/24 1200]   NUTRITION - FOCUSED PHYSICAL EXAM:  Flowsheet Row Most Recent Value  Orbital Region Severe depletion  Upper Arm Region Moderate depletion  Thoracic and Lumbar Region Mild depletion  Buccal Region Severe depletion  Temple Region Severe depletion  Clavicle Bone Region Severe depletion  Clavicle and Acromion Bone Region Severe depletion  Scapular Bone Region Severe depletion  Dorsal Hand Severe depletion  Patellar Region Severe depletion  Anterior Thigh Region Severe depletion  Posterior Calf Region Severe depletion  Edema (RD Assessment) None  Hair Reviewed  Eyes Reviewed  Mouth --  [edentulous]  Skin Reviewed  Nails Reviewed    EDUCATION NEEDS:   Education needs have been addressed  Skin:  Skin Assessment: Skin Integrity Issues: Skin Integrity Issues:: Other (Comment) Other: Hip and coccyx wounds charted. No WOC eval.  Last BM:  11/14 type 5  Height:   Ht Readings from Last 1 Encounters:  02/21/24 6' 3 (1.905 m)    Weight:    Weight Change: No weight from this admission, 67.1 kg charted by nursing using July weight.  Usual Body Weight: 126 lbs 2 weeks ago per wife, 160-170 lbs 1 year ago  Edema: none  Ideal Body Weight:  89  kg   BMI:  Body mass index is 18.49 kg/m.  Estimated Nutritional Needs:  Kcal:  2100-2400 Protein:  115-130 Fluid:  >2100  Juliya Magill, MS, RD, LDN Clinical Dietitian  Contact via secure chat. If unavailable, use group chat RD Inpatient.

## 2024-02-27 NOTE — TOC Progression Note (Signed)
 Transition of Care [facility]) - Progression Note    Patient Details  Name: Bruce Franklin MRN: [id number] Date of Birth: February 26, 1946  Transition of Care [facility]) CM/SW Contact  Clotilda Brenner, [place] Phone Number: 02/27/2024, 10:36 AM  Clinical Narrative:  CSW contacted [name] in admissions at [facility] rehab. The patient has been offered a bed. The facility can take patient when patient is medically appropriate for discharge. Insurance shara will also need to be initiated. Palliative also plans to see patient. Discharge disposition is pending at this time. ICM will continue to follow.     Expected Discharge Plan: Skilled Nursing Facility Barriers to Discharge: SNF Pending bed offer, Continued Medical Work up               Expected Discharge Plan and Services In-house Referral: Clinical Social Work     Living arrangements for the past 2 months: Single Family Home                                       Social Drivers of Health (SDOH) Interventions SDOH Screenings   Food Insecurity: Patient Declined (02/22/2024)  Housing: Patient Declined (02/22/2024)  Transportation Needs: Patient Declined (02/22/2024)  Utilities: Patient Declined (02/22/2024)  Social Connections: Patient Declined (02/22/2024)  Tobacco Use: High Risk (02/21/2024)    Readmission Risk Interventions     No data to display

## 2024-02-27 NOTE — Plan of Care (Signed)
  Problem: Metabolic: Goal: Ability to maintain appropriate glucose levels will improve Outcome: Progressing   Problem: Skin Integrity: Goal: Risk for impaired skin integrity will decrease Outcome: Progressing   Problem: Tissue Perfusion: Goal: Adequacy of tissue perfusion will improve Outcome: Progressing   Problem: Clinical Measurements: Goal: Ability to maintain clinical measurements within normal limits will improve Outcome: Progressing

## 2024-02-27 NOTE — Progress Notes (Signed)
 Speech Language Pathology Treatment: Dysphagia  Patient Details Name: Bruce Franklin MRN: [id number] DOB: Nov 02, 1945 Today's Date: 02/27/2024 Time: 8750-8673 SLP Time Calculation (min) (ACUTE ONLY): 37 min  Assessment / Plan / Recommendation Clinical Impression  Pt's weaning his supplemental O2 today and is eager for POs. Initial two, small ice chips resulted in immediate and prolonged coughing with difficulty expectorating secretions. Discussed concern for aspiration with pt and wife, with likely thicker secretions given that he has been NPO. SLP offered trials of purees before offering any more thin liquids with only occasional coughing after sips of water . Pt verbalized that he thinks his swallowing feels more back to his baseline today. Discussed with pt and wife: pt is still very adamant about not wanting feeding tubes, and is in agreement with continuing PO intake. They acknowledge that he is at a risk of ongoing aspiration, and that we can be as careful as possible, but we cannot prevent this. We are not sure how his body will tolerate it as time goes on, but there are also risks with being kept NPO, including increased difficulty managing secretions. Case was also discussed with MD, who is in agreement with resuming diet with acceptance of aspiration risk given that pt feels he is at his baseline for swallowing.  PLAN: Will resume a PO diet. Pt is at known risk of aspiration and is accepting of this, but prefers to start with softer food choices. Will initiate Dys 1 diet and thin liquids. Pt will cough, but thickening his liquids will not eliminate this. Would provide careful assistance with feeding, monitor respiratory status, and offer frequent, thorough oral care.    HPI HPI: Bruce Franklin is a 78 y.o. male admitted 02/21/24 with sepsis, multifocal pna, suspected aspiration, acute respiratory failure.  MBS 11/13: intermittent aspiration thin and nectar thick liquids due to delays in onset  of the swallow response.  Both silent and sensed aspiration. Performance c/w prior MBS studies.  Pt/wife met with Palliative Care 11/14; plan is to continue POs with known risk of recurring pna due to aspiration. PMHx COPD not on home oxygen, CAD, HFpEF, A-fib on Eliquis , hypertension, hyperlipidemia, insulin -dependent type 2 diabetes, prostate cancer, anxiety, tobacco abuse, chronic pain syndrome, oropharyngeal dysphagia -PEG tube 08/10/23; removed 7/31.  MBS 08/05/23 and 08/08/23 with significant dysphagia, DIGEST score of 3 severe dysfunction. Aspiration occurred due to mistiming of the swallow, altered mentation.      SLP Plan  Continue with current plan of care          Recommendations  Diet recommendations: Dysphagia 1 (puree);Thin liquid Medication Administration: Crushed with puree Supervision: Staff to assist with self feeding Compensations: Minimize environmental distractions;Slow rate;Small sips/bites Postural Changes and/or Swallow Maneuvers: Seated upright 90 degrees                  Oral care QID;Oral care prior to ice chip/H20     Dysphagia, pharyngeal phase (R13.13)     Continue with current plan of care     Leita SAILOR., M.A. CCC-SLP Acute Rehabilitation Services Office: [phone]  Secure chat preferred   02/27/2024, 1:56 PM

## 2024-02-27 NOTE — Progress Notes (Signed)
 Daily Progress Note   Patient Name: Bruce Franklin       Date: 02/27/2024 DOB: 10-Apr-1946  Age: 78 y.o. MRN#: [id number] Attending Physician: Perri DELENA Meliton Mickey., * Primary Care Physician: Dalbert Standing, MD Admit Date: 02/21/2024  Reason for Consultation/Follow-up: Establishing goals of care  Patient Profile/HPI:   78 y.o. male  with past medical history of COPD not on home oxygen, CAD, HFpEF, A-fib on Eliquis , hypertension, hyperlipidemia, insulin -dependent type 2 diabetes, prostate cancer, anxiety, tobacco abuse, chronic pain syndrome, oropharyngeal dysphagia underwent PEG tube placement in April 2025 and later improved and PEG tube was removed on 7/31. Patient presents to the ED today with complaints of abdominal pain and shortness of breath that started 3 days ago.  He was admitted on 02/21/2024 with sepsis, multifocal pneumonia, acute hypoxic respiratory failure, high risk for aspiration, A-fib with RVR, abdominal pain secondary to constipation, chronic HFpEF, uncontrolled type 2 diabetes, severe malnutrition, and others. He had significant dysphagia and aspiration pneumonia. Palliative consulted for GOC.    Subjective: Chart reviewed including labs, progress notes, imaging from this and previous encounters.  Palliative was contacted by SLP due to patient appearing to have worsening status of swallow. Per previous discussion with Palliative patient and spouse had elected to accept risk of aspiration and continue with diet. Patient absolutely would not want a feeding tube.  On evaluation- patient awake and alert. He is eager to have something to eat.  Per SLP note- discussion held with patient and spouse today and they continue to desire to allow patient diet and decline feeding tube.  Patient feels his swallow is at baseline.  No family at bedside.   ROS   Physical Exam Vitals and nursing note reviewed.  Constitutional:      Comments: Frail, then  Skin:    General: Skin is warm and dry.     Coloration: Skin is pale.             Vital Signs: BP 127/74 (BP Location: Left Arm)   Pulse 90   Temp (!) 97.5 F (36.4 C) (Oral)   Resp 17   Ht 6' 3 (1.905 m)   Wt 67.1 kg Comment: from July 2025 records  SpO2 92%   BMI 18.49 kg/m  SpO2: SpO2: 92 % O2 Device: O2 Device: High Flow  Nasal Cannula O2 Flow Rate: O2 Flow Rate (L/min): 4 L/min (Weaned to 4L HFNC)  Intake/output summary:  Intake/Output Summary (Last 24 hours) at 02/27/2024 1517 Last data filed at 02/27/2024 0730 Gross per 24 hour  Intake 2105.16 ml  Output 1000 ml  Net 1105.16 ml   LBM: Last BM Date : 02/26/24 Baseline Weight: Weight: 67.1 kg (from July 2025 records) Most recent weight: Weight: 67.1 kg (from July 2025 records)       Palliative Assessment/Data: PPS: 30%      Patient Active Problem List   Diagnosis Date Noted   Protein-calorie malnutrition, severe 02/25/2024   Constipation 02/22/2024   (HFpEF) heart failure with preserved ejection fraction (HCC) 02/22/2024   Atrial fibrillation with RVR (HCC) 02/22/2024   Multifocal pneumonia 02/21/2024   Pressure injury of skin 08/08/2023   DNR (do not resuscitate)/DNI(Do Not Intubate) 08/08/2023   Oropharyngeal dysphagia 08/06/2023   Severe sepsis (HCC) 08/03/2023   CAD (coronary artery disease) 08/03/2023   Chronic heart failure with mildly reduced ejection fraction (HFmrEF, 41-49%) (HCC) 08/03/2023   Chronic prescription opiate use 08/03/2023   Paroxysmal atrial fibrillation (HCC) 08/03/2023   Benign essential hypertension 08/15/2018   COPD (chronic obstructive pulmonary disease) (HCC)    Diabetes mellitus without complication (HCC)    Prostate cancer (HCC)    Type 2 diabetes mellitus without complication    Malnutrition of  moderate degree (HCC) 11/15/2014   Sepsis (HCC) 11/14/2014   TOBACCO DEPENDENCE 06/09/2006   NEPHROLITHIASIS 06/09/2006   BACK PAIN, LOW 06/09/2006    Palliative Care Assessment & Plan    Assessment/Recommendations/Plan  Per previous Palliative discussions patient and spouse have elected for comfort feeding and understand risk of aspiration- agree with SLP note to continue to allow patient to have PO intake. Hospice has been recommended, however, patient and family declined with wishes for patient to go to SNF/rehab- they also declined continued followup with outpatient with Palliative- they understand patient likely to decompensate again in the future   Code Status:   Code Status: Limited: Do not attempt resuscitation (DNR) -DNR-LIMITED -Do Not Intubate/DNI    Prognosis:  Unable to determine  Discharge Planning: Skilled Nursing Facility for rehab with Palliative care service follow-up   Thank you for allowing the Palliative Medicine Team to assist in the care of this patient.  Total time:  Prolonged billing:  Time includes:   Preparing to see the patient (e.g., review of tests) Obtaining and/or reviewing separately obtained history Performing a medically necessary appropriate examination and/or evaluation Counseling and educating the patient/family/caregiver Ordering medications, tests, or procedures Referring and communicating with other health care professionals (when not reported separately) Documenting clinical information in the electronic or other health record Independently interpreting results (not reported separately) and communicating results to the patient/family/caregiver Care coordination (not reported separately) Clinical documentation  Cassondra Stain, AGNP-C Palliative Medicine   Please contact Palliative Medicine Team phone at [phone] for questions and concerns.

## 2024-02-27 NOTE — Progress Notes (Signed)
 PROGRESS NOTE    Bruce Franklin  FMW:[id number] DOB: November 28, 1945 DOA: 02/21/2024 PCP: Dalbert Standing, MD  Chief Complaint  Patient presents with   Emesis    Brief Narrative:   Bruce Franklin is Bruce Franklin 78 y.o. male with medical history significant of COPD not on home oxygen, CAD, HFpEF, Lashon Beringer-fib on Eliquis , hypertension, hyperlipidemia, insulin -dependent type 2 diabetes, prostate cancer, anxiety, tobacco abuse, chronic pain syndrome, oropharyngeal dysphagia underwent PEG tube placement in April 2025 and later improved and PEG tube was removed on 7/31.  Patient presents to the ED today with complaints of abdominal pain and shortness of breath.  Symptoms started about 3 days ago.  He is endorsing shortness of breath and productive cough.     He was hypoxic requiring 2-4 L [place], tachycardic. Labs with WBC count 17.1, COVID/influenza/RSV PCR negative. Chest x-ray showing patchy perihilar airspace opacities in the left upper and right lower lung zones suspicious for multifocal pneumonia. One opacity in the right lung base measures 3.2 cm and appears more lobular. Radiologist recommending follow-up chest radiograph in 6 to 12 weeks to assess resolution and exclude developing neoplasm.  CT abdomen pelvis negative for acute findings. Showing moderate colonic stool burden but no signs of bowel obstruction. Showing scattered bibasilar tiny ground glass densities, left greater than right, suspicious for pneumonia or aspiration. EKG showing sinus tachycardia, PACs, and no acute ischemic changes. Patient was given ceftriaxone , doxycycline , oxycodone , and MiraLAX  admitted to [facility] service for further management.   Patient had MBS 02/23/24, intermittent aspiration seen. Allowed dysphagia diet with small sips. Prognosis poor, palliative on board.  Assessment & Plan:   Principal Problem:   Multifocal pneumonia Active Problems:   Severe sepsis (HCC)   CAD (coronary artery disease)   Sepsis (HCC)   Constipation    (HFpEF) heart failure with preserved ejection fraction (HCC)   Atrial fibrillation with RVR (HCC)   Protein-calorie malnutrition, severe  Goals of Care Difficult situation, appreciate palliative care assistance.  Looks like goal is for SNF/rehab (they declined continued outpatient follow up with palliative).  We've discussed risks of aspiration, uncertainty around this - hopefully he does well and improves to the point we can get him to SNF.   Acute Hypoxic Respiratory Failure Aspiration Pneumonia  Multifocal Pneumonia Currently on 4 L [place] CXR this AM with progressive LUL airspace disease and new L lung base opacification, possible small L effusion Continue antibiotics with unasyn Appreciate SLP following, currently dysphagia 1/thin liquid  Dysphagia  Aspiration Risk NPO at this time SLP following IVF  Atrial Fibrillation with RVR Continue diltiazem  q6 Eliquis    Abdominal Discomfort CT with moderate colonic stool burden  HFpEF Appears euvolemic  T2DM SSI  CAD Noted  Chronic Pain Getting IV pain meds now  Severe Protein Calorie Malnutrition Stop ozempic     DVT prophylaxis: elqiuis Code Status: DNR Family Communication: wife over phone 11/17 Disposition:   Status is: Inpatient Remains inpatient appropriate because: need for continued inpatient care   Consultants:  Palliative care  Procedures:  none  Antimicrobials:  Anti-infectives (From admission, onward)    Start     Dose/Rate Route Frequency Ordered Stop   02/22/24 0600  Ampicillin-Sulbactam (UNASYN) 3 g in sodium chloride  0.9 % 100 mL IVPB        3 g 200 mL/hr over 30 Minutes Intravenous Every 6 hours 02/21/24 2358     02/21/24 2115  cefTRIAXone  (ROCEPHIN ) 1 g in sodium chloride  0.9 % 100 mL IVPB  1 g 200 mL/hr over 30 Minutes Intravenous  Once 02/21/24 2112 02/21/24 2230   02/21/24 2115  doxycycline  (VIBRA -TABS) tablet 100 mg        100 mg Oral  Once 02/21/24 2112 02/21/24 2128        Subjective: Asking about going home No other complaints  Objective: Vitals:   02/27/24 0744 02/27/24 0818 02/27/24 1208 02/27/24 1624  BP:  127/74  (!) 141/75  Pulse: 90     Resp:  18 17 16   Temp:  98.5 F (36.9 C) (!) 97.5 F (36.4 C) (!) 97.5 F (36.4 C)  TempSrc:  Oral Oral Oral  SpO2: 94% 92%    Weight:      Height:        Intake/Output Summary (Last 24 hours) at 02/27/2024 1841 Last data filed at 02/27/2024 1755 Gross per 24 hour  Intake 2105.16 ml  Output 1500 ml  Net 605.16 ml   Filed Weights   02/21/24 2315  Weight: 67.1 kg    Examination:  General: No acute distress. Cardiovascular: RRR Lungs: distant, anterior rhonchi  Neurological: Alert - difficult to understand. Moves all extremities 4 with equal strength. Cranial nerves II through XII grossly intact. Extremities: No clubbing or cyanosis. No edema.    Data Reviewed: I have personally reviewed following labs and imaging studies  CBC: Recent Labs  Lab 02/23/24 0417 02/24/24 0332 02/25/24 0417 02/26/24 2007 02/27/24 0612  WBC 10.8* 10.4 7.1 8.6 8.6  NEUTROABS  --   --   --   --  6.3  HGB 14.3 15.1 14.6 14.9 15.3  HCT 42.5 44.5 42.8 44.3 45.8  MCV 89.1 89.0 87.5 89.5 88.9  PLT 275 283 262 259 264    Basic Metabolic Panel: Recent Labs  Lab 02/23/24 0417 02/24/24 0332 02/25/24 0417 02/26/24 2007 02/27/24 0612  NA 134* 132* 136 137 136  K 3.7 3.8 3.3* 3.5 3.6  CL 108 103 104 103 102  CO2 18* 20* 20* 22 22  GLUCOSE 177* 134* 143* 120* 142*  BUN 15 8 8 9  6*  CREATININE 0.78 0.84 0.73 0.70 0.73  CALCIUM  8.4* 8.4* 8.5* 8.7* 8.9  MG  --   --   --   --  1.7  PHOS  --   --   --   --  2.7    GFR: Estimated Creatinine Clearance: 72.2 mL/min (by C-G formula based on SCr of 0.73 mg/dL).  Liver Function Tests: Recent Labs  Lab 02/21/24 1525 02/26/24 2007 02/27/24 0612  AST 14* 27 22  ALT 17 28 26   ALKPHOS 50 40 45  BILITOT 0.9 0.4 0.8  PROT 7.9 6.5 6.9  ALBUMIN 3.3* 2.4*  2.5*    CBG: Recent Labs  Lab 02/26/24 1657 02/26/24 2123 02/27/24 0817 02/27/24 1205 02/27/24 1622  GLUCAP 172* 126* 194* 153* 185*     Recent Results (from the past 240 hours)  Resp panel by RT-PCR (RSV, Flu Bruce Franklin&B, Covid) Anterior Nasal Swab     Status: None   Collection Time: 02/21/24  3:25 PM   Specimen: Anterior Nasal Swab  Result Value Ref Range Status   SARS Coronavirus 2 by RT PCR NEGATIVE NEGATIVE Final   Influenza Kalany Diekmann by PCR NEGATIVE NEGATIVE Final   Influenza B by PCR NEGATIVE NEGATIVE Final    Comment: (NOTE) The Xpert Xpress SARS-CoV-2/FLU/RSV plus assay is intended as an aid in the diagnosis of influenza from Nasopharyngeal swab specimens and should not be used as Bruce Franklin  sole basis for treatment. Nasal washings and aspirates are unacceptable for Xpert Xpress SARS-CoV-2/FLU/RSV testing.  Fact Sheet for Patients: bloggercourse.com  Fact Sheet for Healthcare Providers: seriousbroker.it  This test is not yet approved or cleared by the [place]  FDA and has been authorized for detection and/or diagnosis of SARS-CoV-2 by FDA under an Emergency Use Authorization (EUA). This EUA will remain in effect (meaning this test can be used) for the duration of the COVID-19 declaration under Section 564(b)(1) of the Act, 21 U.S.C. section 360bbb-3(b)(1), unless the authorization is terminated or revoked.     Resp Syncytial Virus by PCR NEGATIVE NEGATIVE Final    Comment: (NOTE) Fact Sheet for Patients: bloggercourse.com  Fact Sheet for Healthcare Providers: seriousbroker.it  This test is not yet approved or cleared by the [place]  FDA and has been authorized for detection and/or diagnosis of SARS-CoV-2 by FDA under an Emergency Use Authorization (EUA). This EUA will remain in effect (meaning this test can be used) for the duration of the COVID-19 declaration under  Section 564(b)(1) of the Act, 21 U.S.C. section 360bbb-3(b)(1), unless the authorization is terminated or revoked.  Performed at [facility], [address], [place], [place] [address]   Culture, blood (Routine X 2) w Reflex to ID Panel     Status: None   Collection Time: 02/22/24 12:00 AM   Specimen: BLOOD  Result Value Ref Range Status   Specimen Description BLOOD SITE NOT SPECIFIED  Final   Special Requests   Final    BOTTLES DRAWN AEROBIC AND ANAEROBIC Blood Culture results may not be optimal due to an inadequate volume of blood received in culture bottles   Culture   Final    NO GROWTH 5 DAYS Performed at [facility], [address], [place], [place] [address]    Report Status 02/27/2024 FINAL  Final  Culture, blood (Routine X 2) w Reflex to ID Panel     Status: None   Collection Time: 02/22/24  2:08 AM   Specimen: BLOOD  Result Value Ref Range Status   Specimen Description BLOOD SITE NOT SPECIFIED  Final   Special Requests   Final    BOTTLES DRAWN AEROBIC AND ANAEROBIC Blood Culture adequate volume   Culture   Final    NO GROWTH 5 DAYS Performed at [facility], [address] [place], [place] [address]    Report Status 02/27/2024 FINAL  Final  MRSA Next Gen by PCR, Nasal     Status: Abnormal   Collection Time: 02/22/24  6:05 AM   Specimen: Nasal Mucosa; Nasal Swab  Result Value Ref Range Status   MRSA by PCR Next Gen DETECTED (Bruce Franklin) NOT DETECTED Final    Comment: RESULT CALLED TO, READ BACK BY AND VERIFIED WITH: DOROTHA NATION RN, AT [phone] 02/22/24 D. VANHOOK (NOTE) The GeneXpert MRSA Assay (FDA approved for NASAL specimens only), is one component of Bhavika Schnider comprehensive MRSA colonization surveillance program. It is not intended to diagnose MRSA infection nor to guide or monitor treatment for MRSA infections. Test performance is not FDA approved in patients less than 44 years old. Performed at [facility], [address] [place], [place] [address]           Radiology Studies: DG CHEST PORT 1 VIEW Result Date: 02/26/2024 EXAM: 1 VIEW(S) XRAY OF THE CHEST 02/26/2024 07:44:00 AM COMPARISON: 02/21/2024 CLINICAL HISTORY: Acute respiratory failure with hypoxia (HCC) [id number] FINDINGS: LUNGS AND PLEURA: Progressive left upper lobe  airspace disease. New left lung base opacification. Possible small left pleural effusion. Emphysema. No pneumothorax. HEART AND MEDIASTINUM: Aortic atherosclerosis. BONES AND SOFT TISSUES: No acute osseous abnormality. IMPRESSION: 1. Progressive left upper lobe airspace disease and new left lung base opacification, likely infectious or inflammatory in etiology. 2. Possible small left pleural effusion. Electronically signed by: Lonni Necessary MD 02/26/2024 11:31 AM EST RP Workstation: [id number]        Scheduled Meds:  (feeding supplement) PROSource Plus  30 mL Oral BID BM   apixaban   5 mg Oral BID   diltiazem   30 mg Oral Q6H   guaiFENesin   200 mg Oral BID   insulin  aspart  0-5 Units Subcutaneous QHS   insulin  aspart  0-9 Units Subcutaneous TID WC   lidocaine   3 patch Transdermal Q24H   mupirocin ointment   Nasal BID   polyethylene glycol  17 g Oral Daily   senna-docusate  1 tablet Oral BID   Continuous Infusions:  ampicillin-sulbactam (UNASYN) IV 3 g (02/27/24 1721)     LOS: 5 days    Time spent: over 30 min     Meliton Monte, MD [facility]   To contact the attending provider between 7A-7P or the covering provider during after hours 7P-7A, please log into the web site [email or web address] and access using universal [place] password for that web site. If you do not have the password, please call the hospital operator.  02/27/2024, 6:41 PM

## 2024-02-27 NOTE — Plan of Care (Signed)
   Problem: Fluid Volume: Goal: Ability to maintain a balanced intake and output will improve Outcome: Progressing

## 2024-02-28 ENCOUNTER — Inpatient Hospital Stay (HOSPITAL_COMMUNITY)

## 2024-02-28 DIAGNOSIS — E43 Unspecified severe protein-calorie malnutrition: Secondary | ICD-10-CM

## 2024-02-28 DIAGNOSIS — J188 Other pneumonia, unspecified organism: Secondary | ICD-10-CM | POA: Diagnosis not present

## 2024-02-28 DIAGNOSIS — T17908S Unspecified foreign body in respiratory tract, part unspecified causing other injury, sequela: Secondary | ICD-10-CM

## 2024-02-28 LAB — BASIC METABOLIC PANEL WITH GFR
Anion gap: 15 (ref 5–15)
BUN: 13 mg/dL (ref 8–23)
CO2: 18 mmol/L — ABNORMAL LOW (ref 22–32)
Calcium: 9.1 mg/dL (ref 8.9–10.3)
Chloride: 104 mmol/L (ref 98–111)
Creatinine, Ser: 0.69 mg/dL (ref 0.61–1.24)
GFR, Estimated: >60 mL/min (ref >60)
Glucose, Bld: 115 mg/dL — ABNORMAL HIGH (ref 70–99)
Potassium: 3.9 mmol/L (ref 3.5–5.1)
Sodium: 137 mmol/L (ref 135–145)

## 2024-02-28 LAB — PHOSPHORUS: Phosphorus: 3.4 mg/dL (ref 2.5–4.6)

## 2024-02-28 LAB — CBC
HCT: 47.3 % (ref 39.0–52.0)
Hemoglobin: 15.4 g/dL (ref 13.0–17.0)
MCH: 29.7 pg (ref 26.0–34.0)
MCHC: 32.6 g/dL (ref 30.0–36.0)
MCV: 91.1 fL (ref 80.0–100.0)
Platelets: 253 K/uL (ref 150–400)
RBC: 5.19 MIL/uL (ref 4.22–5.81)
RDW: 13.2 % (ref 11.5–15.5)
WBC: 7.7 K/uL (ref 4.0–10.5)
nRBC: 0 % (ref 0.0–0.2)

## 2024-02-28 LAB — GLUCOSE, CAPILLARY: Glucose-Capillary: 176 mg/dL — ABNORMAL HIGH (ref 70–99)

## 2024-02-28 LAB — MAGNESIUM: Magnesium: 1.9 mg/dL (ref 1.7–2.4)

## 2024-02-28 MED ORDER — GLYCOPYRROLATE 0.2 MG/ML IJ SOLN
0.2000 mg | INTRAMUSCULAR | Status: DC | PRN
Start: 2024-02-28 — End: 2024-02-28

## 2024-02-28 MED ORDER — LORAZEPAM 2 MG/ML IJ SOLN: 1.0000 mg | INTRAMUSCULAR | Status: DC | PRN

## 2024-02-28 MED ORDER — GLYCOPYRROLATE 0.2 MG/ML IJ SOLN: 0.2000 mg | INTRAMUSCULAR | Status: DC | PRN

## 2024-02-28 MED ORDER — LORAZEPAM 1 MG PO TABS: 1.0000 mg | ORAL_TABLET | ORAL | Status: DC | PRN

## 2024-02-28 MED ORDER — BIOTENE DRY MOUTH MT LIQD: 15.0000 mL | OROMUCOSAL | Status: DC | PRN

## 2024-02-28 MED ORDER — GLYCOPYRROLATE 1 MG PO TABS
1.0000 mg | ORAL_TABLET | ORAL | Status: DC | PRN
Start: 2024-02-28 — End: 2024-02-28

## 2024-02-28 MED ORDER — LORAZEPAM 2 MG/ML PO CONC: 1.0000 mg | ORAL | Status: DC | PRN

## 2024-02-28 MED ORDER — DEXTROSE IN LACTATED RINGERS 5 % IV SOLN: INTRAVENOUS | Status: DC

## 2024-02-28 MED ORDER — MORPHINE SULFATE (PF) 2 MG/ML IV SOLN: 1.0000 mg | INTRAVENOUS | Status: DC | PRN

## 2024-02-28 MED ORDER — POLYVINYL ALCOHOL 1.4 % OP SOLN: 1.0000 [drp] | Freq: Four times a day (QID) | OPHTHALMIC | Status: DC | PRN

## 2024-02-28 NOTE — Progress Notes (Signed)
 PROGRESS NOTE    Bruce Franklin  FMW:[id number] DOB: 05/07/1945 DOA: 02/21/2024 PCP: Bruce Standing, MD  Chief Complaint  Patient presents with   Emesis    Brief Narrative:   Bruce Franklin is Bruce Franklin 78 y.o. male with medical history significant of COPD not on home oxygen, CAD, HFpEF, Bruce Franklin on Eliquis , hypertension, hyperlipidemia, insulin -dependent type 2 diabetes, prostate cancer, anxiety, tobacco abuse, chronic pain syndrome, oropharyngeal dysphagia underwent PEG tube placement in April 2025 and later improved and PEG tube was removed on 7/31.    He represented to the ED with 3 days of abdominal pain and SOB.    CXR showed findings concerning for multifocal pneumonia.  CT showed moderate stool burden, findings concerning for aspiration/pneumonia.     MBS 02/23/24 showed intermittent aspiration of thin and nectar thick liquids (similar findings to [facility] April 2025).  He's not interested in another PEG tube.    Respiratory status has intermittently worsened in the past few days with recurrent aspiration.  He's had worsening O2 requirements this AM and issues with oral secretions.  He's declining this morning with worsening weakness.  Goals of care discussions ongoing.  Assessment & Plan:   Principal Problem:   Multifocal pneumonia Active Problems:   Severe sepsis (HCC)   CAD (coronary artery disease)   Sepsis (HCC)   Constipation   (HFpEF) heart failure with preserved ejection fraction (HCC)   Atrial fibrillation with RVR (HCC)   Protein-calorie malnutrition, severe  Goals of Care Difficult situation, appreciate palliative care assistance.  Goal was for SNF/rehab, but he's had intermittent worsening of his respiratory status with aspiration.  His O2 needs increased to 10 L today and he's progressively weaker, not able to speak this morning presumably due to his weakened state.  Given his recurrent aspiration and decline, I think hospice would be appropriate.  Broached this with him  at bedside and his wife over the phone.  Will continue supportive care at this time with IVF, NPO status.  Appreciate palliative care assistance with further GOC discussions.    Acute Hypoxic Respiratory Failure Aspiration Pneumonia  Multifocal Pneumonia Currently on 10 L [place], issues controlling oral secretions today CXR this AM with progressive LUL airspace disease and new L lung base opacification, possible small L effusion Continue antibiotics with unasyn Appreciate SLP following, currently dysphagia 1/thin liquid  Dysphagia  Aspiration Risk Will make him NPO again this am SLP following - we reintroduced diet 11/17 with acknowledgement from family that he's at risk for ongoing aspiration He's against any NG tube placement again  Atrial Fibrillation with RVR Continue diltiazem  q6 Eliquis    Abdominal Discomfort CT with moderate colonic stool burden  HFpEF Appears euvolemic  T2DM SSI  CAD Noted  Chronic Pain Getting IV pain meds now  Severe Protein Calorie Malnutrition Stop ozempic     DVT prophylaxis: elqiuis Code Status: DNR Family Communication: wife over phone 11/17 Disposition:   Status is: Inpatient Remains inpatient appropriate because: need for continued inpatient care   Consultants:  Palliative care  Procedures:  none  Antimicrobials:  Anti-infectives (From admission, onward)    Start     Dose/Rate Route Frequency Ordered Stop   02/22/24 0600  Ampicillin-Sulbactam (UNASYN) 3 g in sodium chloride  0.9 % 100 mL IVPB        3 g 200 mL/hr over 30 Minutes Intravenous Every 6 hours 02/21/24 2358     02/21/24 2115  cefTRIAXone  (ROCEPHIN ) 1 g in sodium chloride  0.9 % 100 mL  IVPB        1 g 200 mL/hr over 30 Minutes Intravenous  Once 02/21/24 2112 02/21/24 2230   02/21/24 2115  doxycycline  (VIBRA -TABS) tablet 100 mg        100 mg Oral  Once 02/21/24 2112 02/21/24 2128       Subjective: Only able to shake his head Y/N today When asked if he could  speak shook head no - implied this was due to weakness  Objective: Vitals:   02/28/24 0827 02/28/24 0845 02/28/24 0849 02/28/24 0854  BP: (!) 144/79 (!) 157/63    Pulse:  (!) 106 (!) 105 (!) 111  Resp: 16 16    Temp: 97.9 F (36.6 C)     TempSrc: Oral     SpO2:  (!) 85% (!) 88% 90%  Weight:      Height:        Intake/Output Summary (Last 24 hours) at 02/28/2024 0931 Last data filed at 02/28/2024 0900 Gross per 24 hour  Intake --  Output 1600 ml  Net -1600 ml   Filed Weights   02/21/24 2315  Weight: 67.1 kg    Examination:  General: frail, weak, ill appearing Cardiovascular: RRR Lungs: diminished, anterior rhonchi Abdomen: Soft, nontender, nondistended  Neurological: lethargic, shakes head Y/N, moving all extremities Extremities: No clubbing or cyanosis. No edema.   Data Reviewed: I have personally reviewed following labs and imaging studies  CBC: Recent Labs  Lab 02/24/24 0332 02/25/24 0417 02/26/24 2007 02/27/24 0612 02/28/24 0549  WBC 10.4 7.1 8.6 8.6 7.7  NEUTROABS  --   --   --  6.3  --   HGB 15.1 14.6 14.9 15.3 15.4  HCT 44.5 42.8 44.3 45.8 47.3  MCV 89.0 87.5 89.5 88.9 91.1  PLT 283 262 259 264 253    Basic Metabolic Panel: Recent Labs  Lab 02/24/24 0332 02/25/24 0417 02/26/24 2007 02/27/24 0612 02/28/24 0549  NA 132* 136 137 136 137  K 3.8 3.3* 3.5 3.6 3.9  CL 103 104 103 102 104  CO2 20* 20* 22 22 18*  GLUCOSE 134* 143* 120* 142* 115*  BUN 8 8 9  6* 13  CREATININE 0.84 0.73 0.70 0.73 0.69  CALCIUM  8.4* 8.5* 8.7* 8.9 9.1  MG  --   --   --  1.7 1.9  PHOS  --   --   --  2.7 3.4    GFR: Estimated Creatinine Clearance: 72.2 mL/min (by C-G formula based on SCr of 0.69 mg/dL).  Liver Function Tests: Recent Labs  Lab 02/21/24 1525 02/26/24 2007 02/27/24 0612  AST 14* 27 22  ALT 17 28 26   ALKPHOS 50 40 45  BILITOT 0.9 0.4 0.8  PROT 7.9 6.5 6.9  ALBUMIN 3.3* 2.4* 2.5*    CBG: Recent Labs  Lab 02/26/24 2123 02/27/24 0817  02/27/24 1205 02/27/24 1622 02/27/24 2118  GLUCAP 126* 194* 153* 185* 217*     Recent Results (from the past 240 hours)  Resp panel by RT-PCR (RSV, Flu Bruce FranklinB, Covid) Anterior Nasal Swab     Status: None   Collection Time: 02/21/24  3:25 PM   Specimen: Anterior Nasal Swab  Result Value Ref Range Status   SARS Coronavirus 2 by RT PCR NEGATIVE NEGATIVE Final   Influenza Davontae Prusinski by PCR NEGATIVE NEGATIVE Final   Influenza B by PCR NEGATIVE NEGATIVE Final    Comment: (NOTE) The Xpert Xpress SARS-CoV-2/FLU/RSV plus assay is intended as an aid in the diagnosis of influenza from  Nasopharyngeal swab specimens and should not be used as Natalija Mavis sole basis for treatment. Nasal washings and aspirates are unacceptable for Xpert Xpress SARS-CoV-2/FLU/RSV testing.  Fact Sheet for Patients: bloggercourse.com  Fact Sheet for Healthcare Providers: seriousbroker.it  This test is not yet approved or cleared by the [place]  FDA and has been authorized for detection and/or diagnosis of SARS-CoV-2 by FDA under an Emergency Use Authorization (EUA). This EUA will remain in effect (meaning this test can be used) for the duration of the COVID-19 declaration under Section 564(b)(1) of the Act, 21 U.S.C. section 360bbb-3(b)(1), unless the authorization is terminated or revoked.     Resp Syncytial Virus by PCR NEGATIVE NEGATIVE Final    Comment: (NOTE) Fact Sheet for Patients: bloggercourse.com  Fact Sheet for Healthcare Providers: seriousbroker.it  This test is not yet approved or cleared by the [place]  FDA and has been authorized for detection and/or diagnosis of SARS-CoV-2 by FDA under an Emergency Use Authorization (EUA). This EUA will remain in effect (meaning this test can be used) for the duration of the COVID-19 declaration under Section 564(b)(1) of the Act, 21 U.S.C. section 360bbb-3(b)(1),  unless the authorization is terminated or revoked.  Performed at [facility], [address], [place], [place] [address]   Culture, blood (Routine X 2) w Reflex to ID Panel     Status: None   Collection Time: 02/22/24 12:00 AM   Specimen: BLOOD  Result Value Ref Range Status   Specimen Description BLOOD SITE NOT SPECIFIED  Final   Special Requests   Final    BOTTLES DRAWN AEROBIC AND ANAEROBIC Blood Culture results may not be optimal due to an inadequate volume of blood received in culture bottles   Culture   Final    NO GROWTH 5 DAYS Performed at [facility], [address] [place], [place] [address]    Report Status 02/27/2024 FINAL  Final  Culture, blood (Routine X 2) w Reflex to ID Panel     Status: None   Collection Time: 02/22/24  2:08 AM   Specimen: BLOOD  Result Value Ref Range Status   Specimen Description BLOOD SITE NOT SPECIFIED  Final   Special Requests   Final    BOTTLES DRAWN AEROBIC AND ANAEROBIC Blood Culture adequate volume   Culture   Final    NO GROWTH 5 DAYS Performed at [facility], [address] [place], [place] [address]    Report Status 02/27/2024 FINAL  Final  MRSA Next Gen by PCR, Nasal     Status: Abnormal   Collection Time: 02/22/24  6:05 AM   Specimen: Nasal Mucosa; Nasal Swab  Result Value Ref Range Status   MRSA by PCR Next Gen DETECTED (Marcial Pless) NOT DETECTED Final    Comment: RESULT CALLED TO, READ BACK BY AND VERIFIED WITH: DOROTHA NATION RN, AT [phone] 02/22/24 D. VANHOOK (NOTE) The GeneXpert MRSA Assay (FDA approved for NASAL specimens only), is one component of Jazzmine Kleiman comprehensive MRSA colonization surveillance program. It is not intended to diagnose MRSA infection nor to guide or monitor treatment for MRSA infections. Test performance is not FDA approved in patients less than 66 years old. Performed at [facility], [address] [place], [place] [address]          Radiology Studies: No results  found.       Scheduled Meds:  (feeding supplement) PROSource Plus  30 mL Oral BID BM   apixaban   5  mg Oral BID   diltiazem   30 mg Oral Q6H   guaiFENesin   200 mg Oral BID   insulin  aspart  0-5 Units Subcutaneous QHS   insulin  aspart  0-9 Units Subcutaneous TID WC   lidocaine   3 patch Transdermal Q24H   polyethylene glycol  17 g Oral Daily   senna-docusate  1 tablet Oral BID   Continuous Infusions:  ampicillin-sulbactam (UNASYN) IV 3 g (02/28/24 9392)   dextrose  5% lactated ringers        LOS: 6 days    Time spent: over 30 min     Meliton Monte, MD [facility]   To contact the attending provider between 7A-7P or the covering provider during after hours 7P-7A, please log into the web site [email or web address] and access using universal [place] password for that web site. If you do not have the password, please call the hospital operator.  02/28/2024, 9:31 AM

## 2024-02-28 NOTE — TOC Transition Note (Signed)
 Transition of Care [facility]) - Discharge Note   Patient Details  Name: Bruce Franklin MRN: [id number] Date of Birth: September 12, 1945  Transition of Care [facility]) CM/SW Contact:  Inocente GORMAN Kindle, LCSW Phone Number: 02/28/2024, 4:10 PM   Clinical Narrative:    Patient will DC to: [facility] [place] Anticipated DC date: 02/28/24 Family notified: Spouse Transport by: [facility]   Per MD patient ready for DC to . RN to call report prior to discharge [phone] room 104). RN, patient, patient's family, and facility notified of DC. Discharge Summary sent to facility. DC packet on chart including signed DNR. Ambulance transport requested for patient.   CSW will sign off for now as social work intervention is no longer needed. Please consult us  again if new needs arise.     Final next level of care: [facility] Barriers to Discharge: No Barriers Identified   Patient Goals and CMS Choice Patient states their goals for this hospitalization and ongoing recovery are:: Pt stuggled with speaking CMS Medicare.gov Compare Post Acute Care list provided to:: Patient        Discharge Placement              Patient chooses bed at:  [facility]) Patient to be transferred to facility by: [facility] Name of family member notified: Spouse Patient and family notified of of transfer: 02/28/24  Discharge Plan and Services Additional resources added to the After Visit Summary for   In-house Referral: Clinical Social Work                                   Social Drivers of Health (SDOH) Interventions SDOH Screenings   Food Insecurity: Patient Declined (02/22/2024)  Housing: Patient Declined (02/22/2024)  Transportation Needs: Patient Declined (02/22/2024)  Utilities: Patient Declined (02/22/2024)  Social Connections: Patient Declined (02/22/2024)  Tobacco Use: High Risk (02/21/2024)     Readmission Risk Interventions     No data to display

## 2024-02-28 NOTE — Progress Notes (Signed)
 Patient turned to left side with pillow support under bony prominent areas. Oral care (mouth swab and suction) completed. Patient denies pain.

## 2024-02-28 NOTE — Discharge Summary (Signed)
 Physician Discharge Summary  Bruce Franklin FMW:Franklin DOB: 10/02/1945 DOA: 02/21/2024  PCP: Dalbert Standing, MD  Admit date: 02/21/2024 Discharge date: 02/28/2024  Time spent: 40 minutes  Recommendations for Outpatient Follow-up:  Comfort measures per hospice    Discharge Diagnoses:  Principal Problem:   Multifocal pneumonia Active Problems:   Severe sepsis (HCC)   CAD (coronary artery disease)   Sepsis (HCC)   Constipation   (HFpEF) heart failure with preserved ejection fraction (HCC)   Atrial fibrillation with RVR (HCC)   Protein-calorie malnutrition, severe   Discharge Condition: stable  Diet recommendation: as tolerated for comfort  Filed Weights   02/21/24 2315  Weight: 67.1 kg    History of present illness:   Bruce Franklin with medical history significant of COPD not on home oxygen, CAD, HFpEF, Bruce Franklin on Eliquis , hypertension, hyperlipidemia, insulin -dependent type 2 diabetes, prostate cancer, anxiety, tobacco abuse, chronic pain syndrome, oropharyngeal dysphagia underwent PEG tube placement in April 2025 and later improved and PEG tube was removed on 7/31.     Bruce Franklin with 3 days of abdominal pain and SOB.     CXR showed findings concerning for multifocal pneumonia.  CT showed moderate stool burden, findings concerning for aspiration/pneumonia.     MBS 02/23/24 showed intermittent aspiration of thin and nectar thick liquids (similar findings to [facility] April 2025).  Bruce's not interested in another PEG tube.     Respiratory status has intermittently worsened in the past few days with recurrent aspiration.  Bruce's had worsening O2 requirements this AM and issues with oral secretions.  Bruce's declining this morning with worsening weakness.   We discussed with palliative care, plan is for hospice 11/18 after discussion with family.  Hospital Course:  Assessment and Plan:  Goals of Care Transitioned to comfort measures after  discussion with palliative care today.  Will discharge to inpatient hospice.    Acute Hypoxic Respiratory Failure Aspiration Pneumonia  Multifocal Pneumonia Currently on 10 L [place], issues controlling oral secretions today CXR this AM with patchy LUL zone airspace opacities, small L effusion Comfort measures per hospice   Dysphagia  Aspiration Risk Npo for now Diet at hospice pending tolerance of PO for comfort   Atrial Fibrillation with RVR Will d/c eliquis /diltiazem  with plan for comfort measures   Abdominal Discomfort CT with moderate colonic stool burden   HFpEF Appears euvolemic   T2DM SSI  CAD Noted  Chronic Pain Comfort meds per hospice  Severe Protein Calorie Malnutrition Stop ozempic     Procedures: none   Consultations: Palliative care  Discharge Exam: Vitals:   02/28/24 0854 02/28/24 1144  BP:  120/72  Pulse: (!) 111 (!) 107  Resp:  18  Temp:  98 F (36.7 C)  SpO2: 90% 93%   See progress note  Discharge Instructions   Discharge Instructions     Diet - low sodium heart healthy   Complete by: As directed    Discharge instructions   Complete by: As directed    You were hospitalized with complications from aspiration.   We're discharging you to hospice for comfort measures.   Increase activity slowly   Complete by: As directed    No wound care   Complete by: As directed       Allergies as of 02/28/2024       Reactions   Ibuprofen Hives, Rash   Sulfa Antibiotics Hives   makes my tongue break out  Medication List     STOP taking these medications    aspirin EC 81 MG tablet   Eliquis  5 MG Tabs tablet Generic drug: apixaban    metFORMIN  1000 MG tablet Commonly known as: GLUCOPHAGE    Ozempic (1 MG/DOSE) 4 MG/3ML Sopn Generic drug: Semaglutide (1 MG/DOSE)       TAKE these medications    albuterol  108 (90 Base) MCG/ACT inhaler Commonly known as: VENTOLIN  HFA Inhale 2 puffs into the lungs every 6 (six)  hours as needed for shortness of breath or wheezing.   ALPRAZolam  0.5 MG tablet Commonly known as: XANAX  Take 1 tablet (0.5 mg total) by mouth 3 (three) times daily as needed for anxiety. What changed: when to take this   feeding supplement (JEVITY 1.5 CAL/FIBER) Liqd Place 355 mLs into feeding tube 4 (four) times daily -  with meals and at bedtime. Start with 120 mL feeds and increase by 60 mL every other feed until goal of 355 mL/feed is reached. What changed:  how to take this when to take this reasons to take this additional instructions   megestrol 400 MG/10ML suspension Commonly known as: MEGACE Take 400 mg by mouth daily.   Oxycodone  HCl 10 MG Tabs Place 1 tablet (10 mg total) into feeding tube every 6 (six) hours as needed for moderate pain (pain score 4-6) or severe pain (pain score 7-10). What changed: how to take this       Allergies  Allergen Reactions   Ibuprofen Hives and Rash   Sulfa Antibiotics Hives    makes my tongue break out      The results of significant diagnostics from this hospitalization (including imaging, microbiology, ancillary and laboratory) are listed below for reference.    Significant Diagnostic Studies: DG CHEST PORT 1 VIEW Result Date: 02/28/2024 CLINICAL DATA:  Shortness of breath.  Aspiration. EXAM: PORTABLE CHEST 1 VIEW COMPARISON:  02/26/2024. FINDINGS: The heart size and mediastinal contours are within normal limits. Emphysema. Patchy left upper lung zone airspace opacities, not significantly changed. Similar possible small left pleural effusion. The right costophrenic angle is not completely included within the field of view. No acute osseous abnormality. IMPRESSION: 1. No significant interval change in patchy left upper lung zone airspace opacities, likely infectious or inflammatory. 2. Similar suspected small left pleural effusion. Electronically Signed   By: Harrietta Sherry M.D.   On: 02/28/2024 13:53   DG CHEST PORT 1  VIEW Result Date: 02/26/2024 EXAM: 1 VIEW(S) XRAY OF THE CHEST 02/26/2024 07:44:00 AM COMPARISON: 02/21/2024 CLINICAL HISTORY: Acute respiratory failure with hypoxia (HCC) [id number] FINDINGS: LUNGS AND PLEURA: Progressive left upper lobe airspace disease. New left lung base opacification. Possible small left pleural effusion. Emphysema. No pneumothorax. HEART AND MEDIASTINUM: Aortic atherosclerosis. BONES AND SOFT TISSUES: No acute osseous abnormality. IMPRESSION: 1. Progressive left upper lobe airspace disease and new left lung base opacification, likely infectious or inflammatory in etiology. 2. Possible small left pleural effusion. Electronically signed by: Lonni Necessary MD 02/26/2024 11:31 AM EST RP Workstation: [id number]   DG Swallowing Func-Speech Pathology Result Date: 02/23/2024 Table formatting from the original result was not included. Modified Barium Swallow Study Patient Details Name: Bruce Franklin Bruce Franklin Date of Birth: Bruce Franklin Today's Date: 02/23/2024 HPI/PMH: HPI: Bruce Franklin is Bruce Franklin 78 y.o. Franklin admitted 02/21/24 with sepsis, multifocal pna, suspected aspiration (vomiting earlier in the week), acute respiratory failure. PMHx COPD not on home oxygen, CAD, HFpEF, Alexandros Ewan-fib on Eliquis , hypertension, hyperlipidemia, insulin -dependent type 2 diabetes, prostate cancer, anxiety,  tobacco abuse, chronic pain syndrome, oropharyngeal dysphagia -PEG tube 08/10/23; removed 7/31.  MBS 08/05/23 and 08/08/23 with significant dysphagia, DIGEST score of 3 severe dysfunction. Aspiration occurred due to mistiming of the swallow, altered mentation. Clinical Impression: Pt presents with Qasim Diveley primary pharyngeal dysphagia that is similar to findings from last MBS in April of 2025.  Bruce intermittently aspirated thin and nectar thick liquids due to delays in onset of the swallow response.    Aspiration was not consistently accompanied by Joslyn Ramos cough. When pt coughed, it was not protective in nature.  Pt had difficulty  following instructions to limit bolus size and to tuck chin (chin tuck, when implemented correctly, did not fully protect the airway). There was vallecular residue that collected with solid consistencies - spontaneous sub-swallows helped to clear.  Pt has made clear to staff that Bruce does not want another PEG.  I spoke with his son at the bedside after the study, showed him images of the aspiration, and explained that aspiration will occur intermittently despite best efforts to prevent it.  Bruce has tolerated aspiration in varying degrees over the course of the last six months, but now is more susceptible to getting sick as Gerad Cornelio result (admitted with pna). D/W Dr. Darci; Mr. Chiem may benefit from Palliative Care consult.  SLP will follow. Continue dysphagia 3/thin liquids with small sips; pt will cough when eating/drinking. Factors that may increase risk of adverse event in presence of aspiration Noe & Lianne 2021): Factors that may increase risk of adverse event in presence of aspiration Noe & Lianne 2021): Reduced cognitive function; Frail or deconditioned; Weak cough; Aspiration of thick, dense, and/or acidic materials; Frequent aspiration of large volumes Recommendations/Plan: Swallowing Evaluation Recommendations Swallowing Evaluation Recommendations Recommendations: PO diet PO Diet Recommendation: Dysphagia 3 (Mechanical soft); Thin liquids (Level 0) Liquid Administration via: Cup; Straw Medication Administration: Whole meds with puree Supervision: Staff to assist with self-feeding Swallowing strategies  : Small bites/sips; Slow rate Postural changes: Position pt fully upright for meals Oral care recommendations: Oral care BID (2x/day) Recommended consults: Consider Palliative care Treatment Plan Treatment Plan Treatment recommendations: Therapy as outlined in treatment plan below Follow-up recommendations: No SLP follow up Functional status assessment: Patient has had Haig Gerardo recent decline in their  functional status and demonstrates the ability to make significant improvements in function in Marvella Jenning reasonable and predictable amount of time. Treatment frequency: Min 2x/week Treatment duration: 1 week Interventions: Patient/family education; Aspiration precaution training Recommendations Recommendations for follow up therapy are one component of Trenice Mesa multi-disciplinary discharge planning process, led by the attending physician.  Recommendations may be updated based on patient status, additional functional criteria and insurance authorization. Assessment: Orofacial Exam: Orofacial Exam Oral Cavity: Oral Hygiene: WFL Oral Cavity - Dentition: Edentulous Orofacial Anatomy: WFL Anatomy: Anatomy: WFL Boluses Administered: Boluses Administered Boluses Administered: Thin liquids (Level 0); Mildly thick liquids (Level 2, nectar thick); Moderately thick liquids (Level 3, honey thick); Puree; Solid  Oral Impairment Domain: Oral Impairment Domain Lip Closure: No labial escape Tongue control during bolus hold: Posterior escape of less than half of bolus Bolus preparation/mastication: Slow prolonged chewing/mashing with complete recollection Bolus transport/lingual motion: Brisk tongue motion Oral residue: Trace residue lining oral structures Location of oral residue : Tongue Initiation of pharyngeal swallow : Pyriform sinuses  Pharyngeal Impairment Domain: Pharyngeal Impairment Domain Soft palate elevation: No bolus between soft palate (SP)/pharyngeal wall (PW) Laryngeal elevation: Complete superior movement of thyroid cartilage with complete approximation of arytenoids to epiglottic petiole Anterior hyoid excursion: Complete  anterior movement Epiglottic movement: Complete inversion Laryngeal vestibule closure: Incomplete, narrow column air/contrast in laryngeal vestibule Pharyngeal stripping wave : Present - complete Pharyngeal contraction (Marjorie Lussier/P view only): N/Haide Klinker Pharyngoesophageal segment opening: Complete distension and complete  duration, no obstruction of flow Tongue base retraction: Narrow column of contrast or air between tongue base and PPW Pharyngeal residue: Collection of residue within or on pharyngeal structures Location of pharyngeal residue: Valleculae  Esophageal Impairment Domain: Esophageal Impairment Domain Esophageal clearance upright position: Complete clearance, esophageal coating Pill: Pill Consistency administered: -- (NT) Penetration/Aspiration Scale Score: Penetration/Aspiration Scale Score 1.  Material does not enter airway: Moderately thick liquids (Level 3, honey thick); Puree; Solid 8.  Material enters airway, passes BELOW cords without attempt by patient to eject out (silent aspiration) : Thin liquids (Level 0); Mildly thick liquids (Level 2, nectar thick) Compensatory Strategies: Compensatory Strategies Compensatory strategies: Yes Straw: Ineffective Ineffective Straw: Thin liquid (Level 0); Mildly thick liquid (Level 2, nectar thick) Chin tuck: Ineffective   General Information: Caregiver present: No  Diet Prior to this Study: Regular; Thin liquids (Level 0)   No data recorded  No data recorded  No data recorded  History of Recent Intubation: No  Behavior/Cognition: Alert; Cooperative Self-Feeding Abilities: Able to self-feed Baseline vocal quality/speech: Hypophonia/low volume Volitional Cough: -- (weak) Volitional Swallow: Able to elicit Exam Limitations: No limitations Goal Planning: Prognosis for improved oropharyngeal function: Guarded Barriers to Reach Goals: Cognitive deficits; Severity of deficits No data recorded No data recorded Consulted and agree with results and recommendations: Patient; Family member/caregiver Pain: Pain Assessment Pain Assessment: Faces Faces Pain Scale: 2 Breathing: 1 Negative Vocalization: 1 Facial Expression: 1 Body Language: 1 Consolability: 1 PAINAD Score: 5 End of Session: Start Time:SLP Start Time (ACUTE ONLY): 0945 Stop Time: SLP Stop Time (ACUTE ONLY): 1002 Time  Calculation:SLP Time Calculation (min) (ACUTE ONLY): 17 min Charges: SLP Evaluations $ SLP Speech Visit: 1 Visit SLP Evaluations $BSS Swallow: 1 Procedure $MBS Swallow: 1 Procedure SLP visit diagnosis: SLP Visit Diagnosis: Dysphagia, unspecified (R13.10) Past Medical History: Past Medical History: Diagnosis Date  Acute biliary pancreatitis   Anxiety   Cholelithiasis   COPD (chronic obstructive pulmonary disease) (HCC)   Diabetes mellitus without complication (HCC)   Pneumonia   Prostate cancer (HCC)   Smokes 1 pack of cigarettes per day  Past Surgical History: Past Surgical History: Procedure Laterality Date  CHOLECYSTECTOMY N/Arlan Birks 04/01/2016  Procedure: LAPAROSCOPIC CHOLECYSTECTOMY;  Surgeon: Mitzie DELENA Freund, MD;  Location: [facility] OR;  Service: General;  Laterality: N/Johnasia Liese;  ERCP N/Addison Whidbee 03/30/2016  Procedure: ENDOSCOPIC RETROGRADE CHOLANGIOPANCREATOGRAPHY (ERCP);  Surgeon: Toribio SHAUNNA Cedar, MD;  Location: [facility] ENDOSCOPY;  Service: Endoscopy;  Laterality: N/Weltha Cathy;  IR GASTROSTOMY TUBE MOD SED  08/10/2023  LUMBAR FUSION    PROSTATE SURGERY    ROTATOR CUFF REPAIR   [name] 02/23/2024, 11:20 AM  CT ABDOMEN PELVIS W CONTRAST Result Date: 02/21/2024 CLINICAL DATA:  Left upper quadrant abdominal pain. EXAM: CT ABDOMEN AND PELVIS WITH CONTRAST TECHNIQUE: Multidetector CT imaging of the abdomen and pelvis was performed using the standard protocol following bolus administration of intravenous contrast. RADIATION DOSE REDUCTION: This exam was performed according to the departmental dose-optimization program which includes automated exposure control, adjustment of the mA and/or kV according to patient size and/or use of iterative reconstruction technique. CONTRAST:  75mL OMNIPAQUE  IOHEXOL  350 MG/ML SOLN COMPARISON:  CT abdomen pelvis dated 08/08/2023. FINDINGS: Evaluation of this exam is limited due to respiratory motion. Lower chest: Scattered bibasilar tiny ground-glass densities, left  greater right suspicious for pneumonia  or aspiration. No intra-abdominal free air or free fluid. Hepatobiliary: The liver is unremarkable. There is biliary dilatation, post cholecystectomy. There is mild pneumobilia. Pancreas: Unremarkable. No pancreatic ductal dilatation or surrounding inflammatory changes. Spleen: Normal in size without focal abnormality. Adrenals/Urinary Tract: The adrenal glands unremarkable. There is no hydronephrosis on either side. The visualized ureters and urinary bladder probable Stomach/Bowel: There is moderate stool throughout the colon. There is no bowel obstruction or active inflammation. The appendix is not visualized with certainty. No inflammatory changes identified in the right lower quadrant. Vascular/Lymphatic: Moderate aortoiliac atherosclerotic disease. The IVC is unremarkable. No portal venous gas. There is no adenopathy. Reproductive: Prostatectomy. Other: None Musculoskeletal: Osteopenia with degenerative changes. Lower lumbar fusion. No acute osseous pathology. IMPRESSION: 1. No acute intra-abdominal or pelvic pathology. 2. Moderate colonic stool burden. No bowel obstruction. 3. Scattered bibasilar tiny ground-glass densities, left greater right suspicious for pneumonia or aspiration. 4.  Aortic Atherosclerosis (ICD10-I70.0). Electronically Signed   By: Vanetta Chou M.D.   On: 02/21/2024 17:07   DG Chest 1 View Result Date: 02/21/2024 EXAM: 1 VIEW(S) XRAY OF THE CHEST 02/21/2024 03:48:00 PM COMPARISON: Sleep comparison 08/02/2023. CLINICAL HISTORY: SOB (shortness of breath); Cough. FINDINGS: LUNGS AND PLEURA: Hyperexpanded lungs with findings of emphysema. Patchy perihilar opacities in the left upper lung zone. Nodular opacities also noted in the right lung base, measuring up to 3.2 cm, worrisome for multifocal pneumonia. No pleural effusion. No pneumothorax. HEART AND MEDIASTINUM: No acute abnormality of the cardiac and mediastinal silhouettes. BONES AND SOFT TISSUES: Multilevel thoracic osteophytosis.  No acute osseous abnormality. IMPRESSION: 1. Emphysema. Patchy perihilar airspace opacities in the left upper and right lower lung zones, suspicious for multifocal pneumonia. 2. One opacity in the right lung base measures 3.2 cm and appears more nodular. Follow-up chest radiograph in 6 to 12 weeks is recommended to document resolution and exclude developing neoplasm. Electronically signed by: Rogelia Myers MD 02/21/2024 03:56 PM EST RP Workstation: [id number]    Microbiology: Recent Results (from the past 240 hours)  Resp panel by RT-PCR (RSV, Flu Lillie Portner&B, Covid) Anterior Nasal Swab     Status: None   Collection Time: 02/21/24  3:25 PM   Specimen: Anterior Nasal Swab  Result Value Ref Range Status   SARS Coronavirus 2 by RT PCR NEGATIVE NEGATIVE Final   Influenza Daine Croker by PCR NEGATIVE NEGATIVE Final   Influenza B by PCR NEGATIVE NEGATIVE Final    Comment: (NOTE) The Xpert Xpress SARS-CoV-2/FLU/RSV plus assay is intended as an aid in the diagnosis of influenza from Nasopharyngeal swab specimens and should not be used as Leighton Brickley sole basis for treatment. Nasal washings and aspirates are unacceptable for Xpert Xpress SARS-CoV-2/FLU/RSV testing.  Fact Sheet for Patients: bloggercourse.com  Fact Sheet for Healthcare Providers: seriousbroker.it  This test is not yet approved or cleared by the [place]  FDA and has been authorized for detection and/or diagnosis of SARS-CoV-2 by FDA under an Emergency Use Authorization (EUA). This EUA will remain in effect (meaning this test can be used) for the duration of the COVID-19 declaration under Section 564(b)(1) of the Act, 21 U.S.C. section 360bbb-3(b)(1), unless the authorization is terminated or revoked.     Resp Syncytial Virus by PCR NEGATIVE NEGATIVE Final    Comment: (NOTE) Fact Sheet for Patients: bloggercourse.com  Fact Sheet for Healthcare  Providers: seriousbroker.it  This test is not yet approved or cleared by the [place]  FDA and has been authorized for detection and/or diagnosis  of SARS-CoV-2 by FDA under an Emergency Use Authorization (EUA). This EUA will remain in effect (meaning this test can be used) for the duration of the COVID-19 declaration under Section 564(b)(1) of the Act, 21 U.S.C. section 360bbb-3(b)(1), unless the authorization is terminated or revoked.  Performed at [facility], [address] [place], [place] [address]   Culture, blood (Routine X 2) w Reflex to ID Panel     Status: None   Collection Time: 02/22/24 12:00 AM   Specimen: BLOOD  Result Value Ref Range Status   Specimen Description BLOOD SITE NOT SPECIFIED  Final   Special Requests   Final    BOTTLES DRAWN AEROBIC AND ANAEROBIC Blood Culture results may not be optimal due to an inadequate volume of blood received in culture bottles   Culture   Final    NO GROWTH 5 DAYS Performed at [facility], [address], [place], [place] [address]    Report Status 02/27/2024 FINAL  Final  Culture, blood (Routine X 2) w Reflex to ID Panel     Status: None   Collection Time: 02/22/24  2:08 AM   Specimen: BLOOD  Result Value Ref Range Status   Specimen Description BLOOD SITE NOT SPECIFIED  Final   Special Requests   Final    BOTTLES DRAWN AEROBIC AND ANAEROBIC Blood Culture adequate volume   Culture   Final    NO GROWTH 5 DAYS Performed at [facility], [address] [place], [place] [address]    Report Status 02/27/2024 FINAL  Final  MRSA Next Gen by PCR, Nasal     Status: Abnormal   Collection Time: 02/22/24  6:05 AM   Specimen: Nasal Mucosa; Nasal Swab  Result Value Ref Range Status   MRSA by PCR Next Gen DETECTED (Ahnaf Caponi) NOT DETECTED Final    Comment: RESULT CALLED TO, READ BACK BY AND VERIFIED WITH: DOROTHA NATION RN, AT [phone] 02/22/24 D. VANHOOK (NOTE) The GeneXpert MRSA Assay (FDA approved  for NASAL specimens only), is one component of Laelia Angelo comprehensive MRSA colonization surveillance program. It is not intended to diagnose MRSA infection nor to guide or monitor treatment for MRSA infections. Test performance is not FDA approved in patients less than 71 years old. Performed at [facility], [address], [place], [place] [address]      Labs: Basic Metabolic Panel: Recent Labs  Lab 02/24/24 0332 02/25/24 0417 02/26/24 2007 02/27/24 0612 02/28/24 0549  NA 132* 136 137 136 137  K 3.8 3.3* 3.5 3.6 3.9  CL 103 104 103 102 104  CO2 20* 20* 22 22 18*  GLUCOSE 134* 143* 120* 142* 115*  BUN 8 8 9  6* 13  CREATININE 0.84 0.73 0.70 0.73 0.69  CALCIUM  8.4* 8.5* 8.7* 8.9 9.1  MG  --   --   --  1.7 1.9  PHOS  --   --   --  2.7 3.4   Liver Function Tests: Recent Labs  Lab 02/26/24 2007 02/27/24 0612  AST 27 22  ALT 28 26  ALKPHOS 40 45  BILITOT 0.4 0.8  PROT 6.5 6.9  ALBUMIN 2.4* 2.5*   No results for input(s): LIPASE, AMYLASE in the last 168 hours. No results for input(s): AMMONIA in the last 168 hours. CBC: Recent Labs  Lab 02/24/24 0332 02/25/24 0417 02/26/24 2007 02/27/24 0612 02/28/24 0549  WBC 10.4 7.1 8.6 8.6 7.7  NEUTROABS  --   --   --  6.3  --   HGB 15.1 14.6 14.9 15.3 15.4  HCT 44.5 42.8 44.3 45.8 47.3  MCV 89.0 87.5 89.5 88.9 91.1  PLT 283 262 259 264 253   Cardiac Enzymes: No results for input(s): CKTOTAL, CKMB, CKMBINDEX, TROPONINI in the last 168 hours. BNP: BNP (last 3 results) Recent Labs    02/22/24 0101  BNP 56.9    ProBNP (last 3 results) No results for input(s): PROBNP in the last 8760 hours.  CBG: Recent Labs  Lab 02/27/24 0817 02/27/24 1205 02/27/24 1622 02/27/24 2118 02/28/24 1203  GLUCAP 194* 153* 185* 217* 176*       Signed:  Meliton Monte MD.  [facility] 02/28/2024, 3:32 PM

## 2024-02-28 NOTE — TOC Progression Note (Addendum)
 Transition of Care [facility]) - Progression Note    Patient Details  Name: Bruce Franklin MRN: [id number] Date of Birth: 03/27/1946  Transition of Care [facility]) CM/SW Contact  Graves-Bigelow, Erminio Deems, RN Phone Number: 02/28/2024, 12:34 PM  Clinical Narrative: Palliative NP spoke with spouse- family is agreeable to comfort care. Per Palliative NP, spouse requested [facility])  to follow the patient for an evaluation for inpatient hospice. ICM will continue to follow for additional needs.    Expected Discharge Plan:  (Hospice) Barriers to Discharge: No Barriers Identified  Expected Discharge Plan and Services In-house Referral: Clinical Social Work     Living arrangements for the past 2 months: Single Family Home    Social Drivers of Health (SDOH) Interventions SDOH Screenings   Food Insecurity: Patient Declined (02/22/2024)  Housing: Patient Declined (02/22/2024)  Transportation Needs: Patient Declined (02/22/2024)  Utilities: Patient Declined (02/22/2024)  Social Connections: Patient Declined (02/22/2024)  Tobacco Use: High Risk (02/21/2024)    Readmission Risk Interventions     No data to display

## 2024-02-28 NOTE — Progress Notes (Signed)
 Patient repositioned and turned to right side with pill support under bony prominent areas. Oral care (mouth swab and oral suction) completed. Patient denies pain.   02/28/24 1746  Vitals  BP 118/60  MAP (mmHg) 78  BP Location Left Arm  BP Method Automatic  Pulse Rate 88  Pulse Rate Source Monitor  ECG Heart Rate 88  Resp 18  MEWS COLOR  MEWS Score Color Comfort Care Only  Oxygen Therapy  SpO2 100 %  O2 Device HFNC  O2 Flow Rate (L/min) 5 L/min  Patient Activity (if Appropriate) In bed  Pulse Oximetry Type Intermittent  Pain Assessment  Pain Scale 0-10  Pain Score 0  MEWS Score  MEWS Temp 0  MEWS Systolic 0  MEWS Pulse 0  MEWS RR 0  MEWS LOC 0  MEWS Score 0

## 2024-02-28 NOTE — Progress Notes (Signed)
 PT Cancellation Note  Patient Details Name: Bruce Franklin MRN: [id number] DOB: [birth year]   Cancelled Treatment:    Reason Eval/Treat Not Completed: (P) Other (comment) (per chart review, MD DC acute PT as pt now Comfort Care status. Will sign off acute PT.)   Connell CHRISTELLA Blue 02/28/2024, 12:58 PM

## 2024-02-28 NOTE — Progress Notes (Signed)
 Daily Progress Note   Date: 02/28/2024   Patient Name: Bruce Franklin  DOB: 03/05/1946  MRN: [id number]  Age / Sex: 78 y.o., male  Attending Physician: Perri DELENA Meliton Mickey., * Primary Care Physician: Dalbert Standing, MD Admit Date: 02/21/2024 Length of Stay: 6 days  Reason for Follow-up: Establishing goals of care  Past Medical History:  Diagnosis Date   Acute biliary pancreatitis    Anxiety    Cholelithiasis    COPD (chronic obstructive pulmonary disease) (HCC)    Diabetes mellitus without complication (HCC)    Pneumonia    Prostate cancer (HCC)    Smokes 1 pack of cigarettes per day     Subjective:   Subjective: Chart Reviewed. Updates received. Patient Assessed. Created space and opportunity for patient  and family to explore thoughts and feelings regarding current medical situation.  Today's Discussion: Today before meeting with the patient/family, I reviewed the chart notes including palliative care note from yesterday, nurse note from yesterday, family medicine note from yesterday, family medicine note from today. I also reviewed vital signs, nursing flowsheets, medication administrations record, labs, and imaging.   Prior to seeing the patient at the bedside I spent time talking with the hospitalist for today.  We discussed changes over the past several days indicating ongoing decline.  They have made the patient n.p.o. because of repeated and continued aspiration.  We discussed the family anticipated he would get to a hospice appropriate point, and they were open to discussing that, but they felt it would likely be months in the future.  However, it appears that this timeline is accelerating.  The hospitalist has already reached out to the patient's wife and she is en route for ongoing discussions.  The bedside nurse notified me when the patient's wife arrived.  Today saw the patient at the bedside, his wife Bruce Franklin was present along with her sister.  Today the patient  appears much more somnolent, weak, frail than he did a few days ago.  He did open his eyes when I greeted him but did not speak.  Later in the visit when the CNA came and asked for the okay to check his blood sugar he mumbled a few words but then went back to sleep.  When I arrived his wife states that he looks much weaker today.  We spent time talking about his active decline over the past few days.  We spent time talking about how we and anticipated decline, but we are hopeful for more time.  We reviewed our previous conversation and plan for discharge to SNF/rehab and eventually home with patient's wife to contact hospice when she feels that he is declining to the point of needing, and them excepting, hospice services.  However, we spent time talking about how it appears that this timeline has been much accelerated and he appears to be significantly worse today and hospice appropriate.  Again, this is not unexpected, just surprising based on the timeline, per patient's family.  We reviewed decisions for no PEG tube, desire for patient to be able to eat/drink what he wants.  However, shared that at this point it does not appear that he is awake enough to be able to eat/drink yesterday they were able to give medications in applesauce, but he was much more awake we talked about options moving forward including continued current scope of care, understanding he would likely continue to decline.  I shared the medical team's opinion that the patient is unlikely to  become stable enough for discharge back to SNF/rehab and, if he was, the likelihood that he would come back to the hospital quickly more aspiration.  We spent time talking about alternative pathways and how to move forward if continuing antibiotics, medications, fingersticks/insulin , daily lab work, etc. going through the motions.  We talked about comfort care as another option forward. I explained comfort care as care where the patient would no longer  receive aggressive medical interventions such as continuous vital signs, lab work, radiology testing, or medications not focused on comfort, peace, and dignity. This includes stopping antibiotics and weaning oxygen to room air, as these are generally not accepted as providing comfort but only prolonging the dying process artificially. All care would focus on how the patient is looking and feeling. This would include management of any symptoms that may cause discomfort, pain, shortness of breath/air hunger, increased work of breathing, cough, nausea, agitation/restlessness, anxiety, and/or secretions etc. Symptoms would be managed with medications and other non-pharmacological interventions such as spiritual support if requested, repositioning, music therapy, or therapeutic listening. Family verbalized understanding and agreement.   We also talked about hospice is comfort care outside of the hospital.  I shared my opinion that the patient is likely appropriate for inpatient hospice admission.  However, home hospice is also an option if family is able to support this level of care. I described hospice as a service for patients who have a life expectancy of 6 months or less. The goal of hospice is the preservation of dignity and quality at the end phases of life. Under hospice care, the focus changes from curative to symptom relief. I explained the three setting where hospice services can be provided including the home, at a living facility (such as LTC SNF, Assisted Living, etc), and a hospice facility. I explained that acceptance to hospice in any specific location is the final decision of the hospice medical director and bed availability, if applicable. They verbalized understanding.  After conversations, the patient's wife would like to transition to comfort care today.  She would also like him to be evaluated for admission to inpatient hospice at [facility] of [facility].  I shared that we would and they  would likely reach out to.  To inpatient hospice pending bed availability.  However, while he remains inpatient he would remain on comfort care here.  I provided another card with Palliative contact information and encouraged her to call with any questions or concerns. I provided emotional and general support through therapeutic listening, empathy, sharing of stories, and other techniques. I answered all questions and addressed all concerns to the best of my ability.  Review of Systems  Unable to perform ROS   Objective:   Primary Diagnoses: Present on Admission:  Multifocal pneumonia  Sepsis (HCC)  CAD (coronary artery disease)  Severe sepsis (HCC)   Vital Signs:  BP (!) 157/63 (BP Location: Left Arm)   Pulse (!) 111   Temp 97.9 F (36.6 C) (Oral)   Resp 16   Ht 6' 3 (1.905 m)   Wt 67.1 kg Comment: from July 2025 records  SpO2 90%   BMI 18.49 kg/m   Physical Exam Vitals and nursing note reviewed.  Constitutional:      General: He is not in acute distress.    Appearance: He is ill-appearing. He is not toxic-appearing.     Comments: Initially alert but easily fell asleep for the majority of the visit; appears very frail and weak  HENT:  Head: Normocephalic and atraumatic.  Cardiovascular:     Rate and Rhythm: Normal rate.  Pulmonary:     Effort: Pulmonary effort is normal. No respiratory distress.     Comments: Noted intermittent cough Abdominal:     General: Abdomen is flat.     Palpations: Abdomen is soft.  Skin:    General: Skin is warm and dry.  Neurological:     Comments: Minimally interactive     Palliative Assessment/Data: 20-30%   Existing Vynca/ACP Documentation: None  Advanced Care Planning:   Existing Vynca/ACP Documentation: None  Primary Decision Maker: NEXT OF KIN  Pertinent diagnosis: Sepsis, aspiration pneumonia, COPD, HFpEF, A-fib on anticoagulation; continued rapid decline in health in this setting  The patient and/or family  consented to a voluntary Advance Care Planning Conversation in person. Individuals present for the conversation: The patient (briefly); patient's wife Bruce Franklin; Camellia Kays, NP  Summary of the conversation: We talked about the changes in his health, rapid decline in the past 48 hours. We talked about what matters to the patient, including quality of life, not suffering. He did not want a PEG tube, wanted to be able to continue eating. We talked about options for care in the context of his wishes/goals with a recommendation for comfort care and hospice evaluation.  Outcome of the conversations and/or documents completed: DNR/DNI (DNR-comfort), transition to comfort care, engage with [facility] for evaluation for admission to IP hospice in [place].  I spent 20 minutes providing separately identifiable ACP services with the patient and/or surrogate decision maker in a voluntary, in-person conversation discussing the patient's wishes and goals as detailed in the above note.  Assessment & Plan:   HPI/Patient Profile:  78 y.o. male  with past medical history of COPD not on home oxygen, CAD, HFpEF, A-fib on Eliquis , hypertension, hyperlipidemia, insulin -dependent type 2 diabetes, prostate cancer, anxiety, tobacco abuse, chronic pain syndrome, oropharyngeal dysphagia underwent PEG tube placement in April 2025 and later improved and PEG tube was removed on 7/31. Patient presents to the ED today with complaints of abdominal pain and shortness of breath that started 3 days ago.  He was admitted on 02/21/2024 with sepsis, multifocal pneumonia, acute hypoxic respiratory failure, high risk for aspiration, A-fib with RVR, abdominal pain secondary to constipation, chronic HFpEF, uncontrolled type 2 diabetes, severe malnutrition, and others.    Palliative medicine was consulted for GOC conversations.  SUMMARY OF RECOMMENDATIONS   Changed to DNR-comfort (from DNR-Limited) Transition to comfort care See symptom  management orders below [facility] consult for referral to IP hospice at [facility] Palliative medicine will continue to follow for comfort care/symptom management  Symptom Management:  Tylenol  650 mg PR every 6 hours as needed mild pain (1-3), fever Biotene solution 15 mL topical as needed dry mouth Artificial tears 1 drop OU 4 times daily as needed dry eyes Robinul 0.2 mg IV every 4 hours as needed excess secretions Ativan  1 mg IV every 4 hours as needed anxiety Arfeen 1 to 4 mg IV every 15 minutes as needed severe pain (7-10), signs/symptoms of distress Compazine 5 mg IV every 6 hours as needed nausea/vomiting Maalox comfort feeds from floor stock  Code Status: DNR - Comfort  Prognosis: < 2 weeks  Discharge Planning: Hospice facility  Discussed with: Patient's family, medical team, nursing team, [facility] team, hospice liaison  Thank you for allowing us  to participate in the care of Bruce Franklin PMT will continue to support holistically.  Billing based on MDM:  High  Problems Addressed: One acute or chronic illness or injury that poses a threat to life or bodily function  Amount and/or Complexity of Data: Category 1:Review of prior external note(s) from each unique source, Review of the result(s) of each unique test, and Assessment requiring an independent historian(s) and Category 3:Discussion of management or test interpretation with external physician/other qualified health care professional/appropriate source (not separately reported)  Risks: Decision not to resuscitate or to de-escalate care because of poor prognosis (DNR-comfort, transition to comfort care, eval for IP hospice)  Detailed review of medical records (labs, imaging, vital signs), medically appropriate exam, discussed with treatment team, counseling and education to patient, family, & staff, documenting clinical information, medication management, coordination of care  Camellia Kays, NP Palliative Medicine  Team  Team Phone # [phone] (Nights/Weekends)  12/09/2020, 8:17 AM

## 2024-02-28 NOTE — Progress Notes (Signed)
 SLP Cancellation Note  Patient Details Name: JAECOB LOWDEN MRN: [id number] DOB: May 06, 1945   Cancelled treatment:        Mr. Streight is transitioning to comfort care. Our service will respectfully sign off.  Elise Knobloch L. Vona, MA CCC/SLP Clinical Specialist - Acute Care SLP Acute Rehabilitation Services Office number [phone]    [name] 02/28/2024, 1:08 PM

## 2024-02-28 NOTE — Progress Notes (Signed)
 Nursing report called to [facility] RN at [facility].
